# Patient Record
Sex: Female | Born: 1950 | Race: Black or African American | Hispanic: No | Marital: Married | Smoking: Never smoker
Health system: Southern US, Community
[De-identification: ages and names within clinical notes are randomized; demographics above are authoritative.]

## PROBLEM LIST (undated history)

## (undated) ENCOUNTER — Encounter

## (undated) ENCOUNTER — Ambulatory Visit: Payer: MEDICARE

## (undated) ENCOUNTER — Ambulatory Visit

## (undated) ENCOUNTER — Encounter: Attending: Gynecologic Oncology | Primary: Gynecologic Oncology

## (undated) ENCOUNTER — Ambulatory Visit
Payer: MEDICARE | Attending: Student in an Organized Health Care Education/Training Program | Primary: Student in an Organized Health Care Education/Training Program

## (undated) ENCOUNTER — Telehealth

## (undated) ENCOUNTER — Telehealth: Attending: Obstetrics & Gynecology | Primary: Obstetrics & Gynecology

## (undated) ENCOUNTER — Ambulatory Visit: Payer: PRIVATE HEALTH INSURANCE

## (undated) ENCOUNTER — Telehealth: Attending: Oncology | Primary: Oncology

## (undated) ENCOUNTER — Encounter: Attending: Oncology | Primary: Oncology

## (undated) ENCOUNTER — Encounter: Attending: Adult Health | Primary: Adult Health

## (undated) ENCOUNTER — Other Ambulatory Visit

## (undated) ENCOUNTER — Ambulatory Visit: Payer: MEDICARE | Attending: Clinical | Primary: Clinical

## (undated) ENCOUNTER — Inpatient Hospital Stay

## (undated) DIAGNOSIS — I10 Essential (primary) hypertension: Secondary | ICD-10-CM

## (undated) DIAGNOSIS — H409 Unspecified glaucoma: Secondary | ICD-10-CM

---

## 2005-02-14 ENCOUNTER — Emergency Department: Payer: Self-pay | Admitting: Emergency Medicine

## 2007-03-07 ENCOUNTER — Emergency Department: Payer: Self-pay | Admitting: Emergency Medicine

## 2011-03-21 ENCOUNTER — Emergency Department: Payer: Self-pay | Admitting: Unknown Physician Specialty

## 2012-07-23 ENCOUNTER — Emergency Department: Payer: Self-pay | Admitting: Emergency Medicine

## 2018-01-24 ENCOUNTER — Encounter: Payer: Self-pay | Admitting: Emergency Medicine

## 2018-01-24 ENCOUNTER — Other Ambulatory Visit: Payer: Self-pay

## 2018-01-24 ENCOUNTER — Emergency Department
Admission: EM | Admit: 2018-01-24 | Discharge: 2018-01-24 | Disposition: A | Discharge status: Home / Self Care | Payer: BC Managed Care – PPO | Attending: Emergency Medicine | Admitting: Emergency Medicine

## 2018-01-24 DIAGNOSIS — I10 Essential (primary) hypertension: Secondary | ICD-10-CM | POA: Diagnosis not present

## 2018-01-24 DIAGNOSIS — R42 Dizziness and giddiness: Secondary | ICD-10-CM

## 2018-01-24 HISTORY — DX: Unspecified glaucoma: H40.9

## 2018-01-24 LAB — CBC
HEMATOCRIT: 42 % (ref 35.0–47.0)
Hemoglobin: 13.9 g/dL (ref 12.0–16.0)
MCH: 30 pg (ref 26.0–34.0)
MCHC: 33.2 g/dL (ref 32.0–36.0)
MCV: 90.4 fL (ref 80.0–100.0)
PLATELETS: 286 10*3/uL (ref 150–440)
RBC: 4.65 MIL/uL (ref 3.80–5.20)
RDW: 14.7 % — AB (ref 11.5–14.5)
WBC: 7.6 10*3/uL (ref 3.6–11.0)

## 2018-01-24 LAB — BASIC METABOLIC PANEL
Anion gap: 10 (ref 5–15)
BUN: 17 mg/dL (ref 6–20)
CHLORIDE: 107 mmol/L (ref 101–111)
CO2: 22 mmol/L (ref 22–32)
CREATININE: 1.01 mg/dL — AB (ref 0.44–1.00)
Calcium: 9.4 mg/dL (ref 8.9–10.3)
GFR calc Af Amer: >60 mL/min (ref >60)
GFR calc non Af Amer: 58 mL/min — ABNORMAL LOW (ref >60)
GLUCOSE: 106 mg/dL — AB (ref 65–99)
Potassium: 3.7 mmol/L (ref 3.5–5.1)
SODIUM: 139 mmol/L (ref 135–145)

## 2018-01-24 MED ORDER — MECLIZINE HCL 25 MG PO TABS
25.0000 mg | ORAL_TABLET | Freq: Once | ORAL | Status: AC
  Administered 2018-01-24: 25 mg via ORAL
  Filled 2018-01-24: qty 1

## 2018-01-24 MED ORDER — MECLIZINE HCL 25 MG PO TABS: 25.0000 mg | ORAL_TABLET | Freq: Three times a day (TID) | ORAL | 0 refills | Status: DC | PRN

## 2018-01-24 NOTE — ED Triage Notes (Signed)
EMS reports patient showed PVC on 12 lead, vitals 180/100 BP.

## 2018-01-24 NOTE — ED Triage Notes (Signed)
Pt to ED via EMS from work c/o hypertension and dizziness.  States dizzy when standing and looking down, off balance feeling.  Denies pain or SOB.  Denies similar episodes.  Denies cardiac hx, only medication for glaucoma.  Skin warm and dry, chest rise even and unlabored.

## 2018-01-24 NOTE — ED Provider Notes (Signed)
[facility] Emergency Department Provider Note  ____________________________________________   I have reviewed the triage vital signs and the nursing notes.   HISTORY  Chief Complaint Hypertension   History limited by: Not Limited   HPI Brandy Ewing is a 67 y.o. female who presents to the emergency department today because of concerns for episode of dizziness.  The patient states that it started today.  She was at school when it started.  She has noticed that it is worse when she stands up or moves.  Does get the sense that things are spinning around her.  Patient had a similar episode on Sunday while she was at church when she became dizzy.  She denies any trauma to her head.  Recently was treated for urinary tract infection but finished antibiotics a couple of days ago.  Denies any chest pain or palpitations.   Per medical record review patient has a history of glaucoma.  Past Medical History:  Diagnosis Date  . Glaucoma     There are no active problems to display for this patient.   Past Surgical History:  Procedure Laterality Date  . CESAREAN SECTION      Prior to Admission medications   Not on File    Allergies Patient has no known allergies.  History reviewed. No pertinent family history.  Social History Social History   Tobacco Use  . Smoking status: Never Smoker  . Smokeless tobacco: Never Used  Substance Use Topics  . Alcohol use: No    Frequency: Never  . Drug use: No    Review of Systems Constitutional: No fever/chills Eyes: No visual changes. ENT: No sore throat. Cardiovascular: Denies chest pain. Respiratory: Denies shortness of breath. Gastrointestinal: No abdominal pain.  No nausea, no vomiting.  No diarrhea.   Genitourinary: Negative for dysuria. Musculoskeletal: Negative for back pain. Skin: Negative for rash. Neurological: Positive for dizziness. ____________________________________________   PHYSICAL  EXAM:  VITAL SIGNS: ED Triage Vitals  Enc Vitals Group     BP 01/24/18 1452 (!) 191/84     Pulse Rate 01/24/18 1452 75     Resp 01/24/18 1452 16     Temp 01/24/18 1452 (!) 97.5 F (36.4 C)     Temp Source 01/24/18 1452 Oral     SpO2 01/24/18 1452 100 %     Weight 01/24/18 1450 165 lb (74.8 kg)   Constitutional: Alert and oriented. Well appearing and in no distress. Eyes: Conjunctivae are normal.  ENT   Head: Normocephalic and atraumatic.   Nose: No congestion/rhinnorhea.   Mouth/Throat: Mucous membranes are moist.   Neck: No stridor. Hematological/Lymphatic/Immunilogical: No cervical lymphadenopathy. Cardiovascular: Normal rate, regular rhythm.  No murmurs, rubs, or gallops.  Respiratory: Normal respiratory effort without tachypnea nor retractions. Breath sounds are clear and equal bilaterally. No wheezes/rales/rhonchi. Gastrointestinal: Soft and non tender. No rebound. No guarding.  Genitourinary: Deferred Musculoskeletal: Normal range of motion in all extremities. No lower extremity edema. Neurologic:  Normal speech and language. No gross focal neurologic deficits are appreciated.  Skin:  Skin is warm, dry and intact. No rash noted. Psychiatric: Mood and affect are normal. Speech and behavior are normal. Patient exhibits appropriate insight and judgment.  ____________________________________________    LABS (pertinent positives/negatives)  CBC wnl except rdw 14.7 BMP glu 106, cr 1.01 ____________________________________________   EKG  I, Nance Pear, attending physician, personally viewed and interpreted this EKG  EKG Time: 1554 Rate: 58 Rhythm: sinus bradycardia Axis: right axis deviation Intervals: qtc  445 QRS: narrow ST changes: no st elevation Impression: abnormal ekg   ____________________________________________     RADIOLOGY  None  ____________________________________________   PROCEDURES  Procedures  ____________________________________________   INITIAL IMPRESSION / ASSESSMENT AND PLAN / ED COURSE  Pertinent labs & imaging results that were available during my care of the patient were reviewed by me and considered in my medical decision making (see chart for details).  Patient presented to the emergency department today because of concerns for dizziness.  Clinical history is concerning for possible vertigo.  It is worse with position change and then resolves.  Patient was given meclizine here in the emergency department and did feel better.  No concerning anemia or electrolyte abnormality.  Terms of the high blood pressure this could be due to the fact the patient is not feeling well.  Will have patient follow-up with primary care. Discussed findings and vertigo with patient.    ____________________________________________   FINAL CLINICAL IMPRESSION(S) / ED DIAGNOSES  Final diagnoses:  Dizziness  Hypertension, unspecified type     Note: This dictation was prepared with Dragon dictation. Any transcriptional errors that result from this process are unintentional     Nance Pear, MD 01/24/18 1729

## 2018-01-24 NOTE — Discharge Instructions (Signed)
Please seek medical attention for any high fevers, chest pain, shortness of breath, change in behavior, persistent vomiting, bloody stool or any other new or concerning symptoms.  

## 2018-02-17 ENCOUNTER — Ambulatory Visit (INDEPENDENT_AMBULATORY_CARE_PROVIDER_SITE_OTHER): Payer: BC Managed Care – PPO | Admitting: Vascular Surgery

## 2018-02-17 ENCOUNTER — Other Ambulatory Visit (INDEPENDENT_AMBULATORY_CARE_PROVIDER_SITE_OTHER): Payer: Self-pay | Admitting: Vascular Surgery

## 2018-02-17 ENCOUNTER — Encounter (INDEPENDENT_AMBULATORY_CARE_PROVIDER_SITE_OTHER): Payer: Self-pay | Admitting: Vascular Surgery

## 2018-02-17 ENCOUNTER — Ambulatory Visit (INDEPENDENT_AMBULATORY_CARE_PROVIDER_SITE_OTHER): Payer: BC Managed Care – PPO

## 2018-02-17 VITALS — BP 144/78 | HR 65 | Resp 16 | Ht 60.0 in | Wt 154.0 lb

## 2018-02-17 DIAGNOSIS — I10 Essential (primary) hypertension: Secondary | ICD-10-CM

## 2018-02-17 DIAGNOSIS — I150 Renovascular hypertension: Secondary | ICD-10-CM | POA: Diagnosis not present

## 2018-02-17 NOTE — Patient Instructions (Signed)
Renovascular Hypertension Renovascular hypertension is high blood pressure that happens when the arteries that carry blood to the kidneys (renal arteries) become narrow. Blood pressure is a measurement of how strongly your blood is pressing against the walls of your arteries. Hypertension, or high blood pressure, is a long-term condition in which blood pressure is higher than normal. Untreated hypertension, including renovascular hypertension, can lead to various health problems, such as:  Heart failure.  Heart disease.  Stroke.  Kidney failure.  Blood vessel damage.  Blindness or vision problems.  What are the causes? This condition occurs when one or both of the renal arteries become narrow. This narrowing reduces blood flow to the kidneys, causing the kidneys to sense that blood pressure is low. As a result, the kidneys make an enzyme called renin that causes an increase in blood pressure. Many conditions can cause the renal arteries to become narrow and lead to renovascular hypertension. Some of these are:  Atherosclerosis. This is a hardening of the renal arteries. It causes plaque to build up and block the renal arteries.  Fibromuscular dysplasia. This is a condition in which cells of the artery wall overgrow, causing a narrowing of the renal arteries. It is a common cause of renovascular hypertension in younger women.  A blockage in the renal artery due to injury, tumors, or blood clots (rare).  What increases the risk? You are more likely to develop this condition if:  You are a woman who is younger than age 71.  You are a man who is older than age 42.  You have a history of heart problems or strokes.  What are the signs or symptoms? In many cases, there are no symptoms. If symptoms are present, they may include:  Sudden high blood pressure that gets worse in older people who previously had well-controlled blood pressure.  Nausea and vomiting.  Vision  problems.  Chest pain.  How is this diagnosed? This condition may be diagnosed based on:  A physical exam and blood pressure check. During the exam, your health care provider may use a stethoscope to listen for a "whooshing" noise (bruit) over the abdomen or on either side between the ribs and the hip (flank area).  Blood tests to measure renin and to check your hormone levels, including a hormone called aldosterone. Aldosterone controls the salt and water balance in your body.  Imaging tests. These may include: ? An ultrasound. This test uses sound waves to produce an image of the inside of your body. ? Renal angiogram. For this test, a dye is injected into a kidney artery to show narrowing of the artery on an X-ray. ? MRI of the arteries that supply the kidneys.  How is this treated? This condition may be treated with:  Medicines. These may be given to help you control your blood pressure.  Treatments or lifestyle changes to address factors or conditions that may be contributing to high blood pressure. This may mean lowering your cholesterol, eating a heart-healthy diet, exercising, and maintaining an ideal body weight.  Surgery to remove a blockage. This may be necessary if a renal artery is blocked badly.  Percutaneous transluminal renal angioplasty (PTRA). This is a procedure to open narrow renal arteries if they are not completely blocked. Sometimes a stent is placed in the artery to prevent the artery from becoming blocked again.  Follow these instructions at home: Monitoring your blood pressure  Monitor your blood pressure at home as told by your health care provider. ?  A blood pressure reading is recorded as two numbers, such as 120 over 80 (or 120/80). The first ("top") number is called the systolic pressure. It is a measure of the pressure in your arteries when your heart beats. The second ("bottom") number is called the diastolic pressure. It is a measure of the pressure in  your arteries as your heart relaxes between beats. A normal blood pressure reading is:  Systolic: below 268.  Diastolic: below 80. ? Your personal target blood pressure may vary depending on your medical conditions, your age, and other factors.  Have your blood pressure rechecked as told by your health care provider. Lifestyle  Work with your health care provider to maintain a healthy body weight or to lose weight. Ask what an ideal weight is for you.  Exercise regularly. Get at least 30-45 minutes of aerobic exercise, at least 5 times a week.  Do not use any products that contain nicotine or tobacco, such as cigarettes and e-cigarettes. If you need help quitting, ask your health care provider. Eating and drinking  Eat a heart-healthy diet. This may include: ? Following the DASH diet. This diet is high in fruits, vegetables, and whole grains. It is low in salt (sodium), saturated fat, and added sugars. ? Keeping your sodium intake below 1,500 mg per day. Do not add salt to your food. Check food labels to see how much sodium is in a food or beverage.  Limit alcohol intake to no more than 1 drink a day for nonpregnant women and 2 drinks a day for men. One drink equals 12 oz of beer, 5 oz of wine, or 1 oz of hard liquor.  Try to limit caffeine. Caffeine may make your renovascular hypertension worse. Check ingredients and nutrition facts to see if a food or beverage contains caffeine. General instructions  Take over-the-counter and prescription medicines only as told by your health care provider.  Keep all follow-up visits as told by your health care provider. This is important. Contact a health care provider if:  Your symptoms continue to get worse and medicine does not help.  You have a fever. Get help right away if:  You develop shortness of breath.  You develop numbness on one side.  You develop areas of muscle weakness.  You are unable to speak.  You feel light-headed or  you pass out.  You have sudden spikes of high blood pressure.  You have symptoms of very high blood pressure. Summary  Renovascular hypertension is high blood pressure that happens when the arteries that carry blood to the kidneys (renal arteries) become narrow.  In many cases, there are no symptoms for this condition.  There are several treatments for renovascular hypertension, including medicines, lifestyle changes, surgery to remove a blockage, or a procedure to widen a narrow artery.  Monitor your blood pressure at home as told by your health care provider. This information is not intended to replace advice given to you by your health care provider. Make sure you discuss any questions you have with your health care provider. Document Released: 10/22/2005 Document Revised: 10/13/2016 Document Reviewed: 10/13/2016 Elsevier Interactive Patient Education  2017 [employer].

## 2018-02-17 NOTE — Assessment & Plan Note (Signed)
The patient has recently developed hypertension so a renal artery duplex was done today. This demonstrates normal renal artery velocities and indices bilaterally with normal kidney sizes bilaterally consistent with no hemodynamically significant renal artery stenosis. It appears as if she has essential hypertension most likely.  Her blood pressure has improved with the addition of amlodipine.  No intervention or further evaluation from a vascular standpoint is required.  I will see the patient back as needed.

## 2018-02-17 NOTE — Progress Notes (Signed)
Patient ID: Brandy Ewing, female   DOB: 10-Sep-1951, 66 y.o.   MRN: [id number]  Chief Complaint  Patient presents with  . New Patient (Initial Visit)    ref Iona Beard for Renal    HPI Brandy Ewing is a 67 y.o. female.  I am asked to see the patient by Dr. Iona Beard for evaluation of her renal arteries.  The patient reports recently beginning to have issues with high blood pressure.  Prior to the past few months, she never had any issues with hypertension.  Her blood pressures ran up in the 160s and 170s.  She denied any trauma or injury.  No major recent lifestyle changes.  With the abrupt onset of hypertension, she was started on Norvasc which has improved her blood pressure significantly.  She is mildly elevated today, but in general her blood pressures have been running pretty normal.  To assess her for potential secondary hypertension, renal artery duplex is performed today.  This demonstrates normal renal artery velocities and indices bilaterally with normal kidney sizes bilaterally consistent with no hemodynamically significant renal artery stenosis.   Past Medical History:  Diagnosis Date  . Glaucoma     Past Surgical History:  Procedure Laterality Date  . CESAREAN SECTION      Family History No bleeding disorders, clotting disorders, autoimmune diseases, or aneurysms  Social History Social History   Tobacco Use  . Smoking status: Never Smoker  . Smokeless tobacco: Never Used  Substance Use Topics  . Alcohol use: No    Frequency: Never  . Drug use: No     No Known Allergies  Current Outpatient Medications  Medication Sig Dispense Refill  . amLODipine (NORVASC) 5 MG tablet   11  . aspirin EC 81 MG tablet Take by mouth.    . dorzolamide (TRUSOPT) 2 % ophthalmic solution Place 1 drop into both eyes 2 (two) times daily.  2  . LUMIGAN 0.01 % SOLN Place 1 drop into both eyes at bedtime.  0  . meclizine (ANTIVERT) 25 MG tablet Take 1 tablet (25 mg total) by mouth 3  (three) times daily as needed for dizziness. (Patient not taking: Reported on 02/17/2018) 30 tablet 0   No current facility-administered medications for this visit.       REVIEW OF SYSTEMS (Negative unless checked)  Constitutional: _0 Weight loss  _1 Fever  _2 Chills Cardiac: _3 Chest pain   _4 Chest pressure   _5 Palpitations   _6 Shortness of breath when laying flat   _7 Shortness of breath at rest   _8 Shortness of breath with exertion. Vascular:  _9 Pain in legs with walking   _10 Pain in legs at rest   _11 Pain in legs when laying flat   _12 Claudication   _13 Pain in feet when walking  _14 Pain in feet at rest  _15 Pain in feet when laying flat   _16 History of DVT   _17 Phlebitis   _18 Swelling in legs   _19 Varicose veins   _20 Non-healing ulcers Pulmonary:   _21 Uses home oxygen   _22 Productive cough   _23 Hemoptysis   _24 Wheeze  _25 COPD   _26 Asthma Neurologic:  _27 Dizziness  _28 Blackouts   _29 Seizures   _30 History of stroke   _31 History of TIA  _32 Aphasia   _33 Temporary blindness   _34 Dysphagia   _35 Weakness or numbness in arms   _36 Weakness or numbness in legs Musculoskeletal:  _37 Arthritis   _38 Joint swelling   _39 Joint pain   _40 Low back pain Hematologic:  _41 Easy bruising  _42 Easy bleeding   _43 Hypercoagulable state   _44 Anemic  _45   Hepatitis Gastrointestinal:  _0 Blood in stool   _1 Vomiting blood  _2 Gastroesophageal reflux/heartburn   _3 Abdominal pain Genitourinary:  _4 Chronic kidney disease   _5 Difficult urination  _6 Frequent urination  _7 Burning with urination   _8 Hematuria Skin:  _9 Rashes   _10 Ulcers   _11 Wounds Psychological:  _12 History of anxiety   _13  History of major depression.    Physical Exam BP (!) 144/78 (BP Location: Right Arm)   Pulse 65   Resp 16   Ht 5' (1.524 m)   Wt 69.9 kg (154 lb)   BMI 30.08 kg/m  Gen:  WD/WN, NAD Head: [place]/AT, No temporalis wasting Ear/Nose/Throat: Hearing grossly intact, nares w/o erythema or drainage, oropharynx w/o Erythema/Exudate Eyes: Conjunctiva clear, sclera non-icteric  Neck: trachea  midline.  No JVD.  Pulmonary:  Good air movement, respirations not labored, no use of accessory muscles Cardiac: RRR Vascular:  Vessel Right Left  Radial Palpable Palpable                                   Musculoskeletal: M/S 5/5 throughout.  Extremities without ischemic changes.  No deformity or atrophy.  No edema. Neurologic: Sensation grossly intact in extremities.  Symmetrical.  Speech is fluent. Motor exam as listed above. Psychiatric: Judgment intact, Mood & affect appropriate for pt's clinical situation. Dermatologic: No rashes or ulcers noted.  No cellulitis or open wounds.  Radiology No results found.  Labs Recent Results (from the past 2160 hour(s))  Basic metabolic panel     Status: Abnormal   Collection Time: 01/24/18  2:52 PM  Result Value Ref Range   Sodium 139 135 - 145 mmol/L   Potassium 3.7 3.5 - 5.1 mmol/L   Chloride 107 101 - 111 mmol/L   CO2 22 22 - 32 mmol/L   Glucose, Bld 106 (H) 65 - 99 mg/dL   BUN 17 6 - 20 mg/dL   Creatinine, Ser 1.01 (H) 0.44 - 1.00 mg/dL   Calcium 9.4 8.9 - 10.3 mg/dL   GFR calc non Af Amer 58 (L) >60 mL/min   GFR calc Af Amer >60 >60 mL/min    Comment: (NOTE) The eGFR has been calculated using the CKD EPI equation. This calculation has not been validated in all clinical situations. eGFR's persistently <60 mL/min signify possible Chronic Kidney Disease.    Anion gap 10 5 - 15    Comment: Performed at [facility], [address] [place], [place] [address]  CBC     Status: Abnormal   Collection Time: 01/24/18  2:52 PM  Result Value Ref Range   WBC 7.6 3.6 - 11.0 K/uL   RBC 4.65 3.80 - 5.20 MIL/uL   Hemoglobin 13.9 12.0 - 16.0 g/dL   HCT 42.0 35.0 - 47.0 %   MCV 90.4 80.0 - 100.0 fL   MCH 30.0 26.0 - 34.0 pg   MCHC 33.2 32.0 - 36.0 g/dL   RDW 14.7 (H) 11.5 - 14.5 %   Platelets 286 150 - 440 K/uL    Comment: Performed at [facility], [address] [place], [place] [address]     Assessment/Plan:  Essential hypertension The patient has recently developed hypertension so a renal artery duplex was done today. This demonstrates normal renal artery velocities and indices bilaterally with normal kidney sizes bilaterally consistent with no hemodynamically significant renal artery stenosis. It appears as if she has essential hypertension most likely.  Her blood pressure has improved  with the addition of amlodipine.  No intervention or further evaluation from a vascular standpoint is required.  I will see the patient back as needed.      Leotis Pain 02/17/2018, 9:52 AM   This note was created with Dragon medical transcription system.  Any errors from dictation are unintentional.

## 2019-11-04 ENCOUNTER — Emergency Department: Payer: BC Managed Care – PPO

## 2019-11-04 ENCOUNTER — Other Ambulatory Visit: Payer: Self-pay

## 2019-11-04 ENCOUNTER — Inpatient Hospital Stay
Admission: EM | Admit: 2019-11-04 | Discharge: 2019-11-09 | DRG: 871 | Disposition: A | Discharge status: Home / Self Care | Payer: BC Managed Care – PPO | Attending: Internal Medicine | Admitting: Internal Medicine

## 2019-11-04 DIAGNOSIS — A419 Sepsis, unspecified organism: Secondary | ICD-10-CM

## 2019-11-04 DIAGNOSIS — A4189 Other specified sepsis: Secondary | ICD-10-CM | POA: Diagnosis not present

## 2019-11-04 DIAGNOSIS — U071 COVID-19: Secondary | ICD-10-CM | POA: Diagnosis present

## 2019-11-04 DIAGNOSIS — J1289 Other viral pneumonia: Secondary | ICD-10-CM | POA: Diagnosis present

## 2019-11-04 DIAGNOSIS — Z7982 Long term (current) use of aspirin: Secondary | ICD-10-CM

## 2019-11-04 DIAGNOSIS — R0902 Hypoxemia: Secondary | ICD-10-CM | POA: Diagnosis present

## 2019-11-04 DIAGNOSIS — E876 Hypokalemia: Secondary | ICD-10-CM | POA: Diagnosis present

## 2019-11-04 DIAGNOSIS — Z825 Family history of asthma and other chronic lower respiratory diseases: Secondary | ICD-10-CM

## 2019-11-04 DIAGNOSIS — N390 Urinary tract infection, site not specified: Secondary | ICD-10-CM | POA: Diagnosis present

## 2019-11-04 DIAGNOSIS — I10 Essential (primary) hypertension: Secondary | ICD-10-CM | POA: Diagnosis present

## 2019-11-04 DIAGNOSIS — Z20822 Contact with and (suspected) exposure to covid-19: Secondary | ICD-10-CM

## 2019-11-04 DIAGNOSIS — J189 Pneumonia, unspecified organism: Secondary | ICD-10-CM | POA: Diagnosis not present

## 2019-11-04 DIAGNOSIS — Z79899 Other long term (current) drug therapy: Secondary | ICD-10-CM

## 2019-11-04 DIAGNOSIS — H409 Unspecified glaucoma: Secondary | ICD-10-CM | POA: Diagnosis present

## 2019-11-04 DIAGNOSIS — E559 Vitamin D deficiency, unspecified: Secondary | ICD-10-CM | POA: Diagnosis present

## 2019-11-04 LAB — BASIC METABOLIC PANEL
Anion gap: 14 (ref 5–15)
BUN: 16 mg/dL (ref 8–23)
CO2: 22 mmol/L (ref 22–32)
Calcium: 9 mg/dL (ref 8.9–10.3)
Chloride: 105 mmol/L (ref 98–111)
Creatinine, Ser: 1.05 mg/dL — ABNORMAL HIGH (ref 0.44–1.00)
GFR calc Af Amer: >60 mL/min (ref >60)
GFR calc non Af Amer: 56 mL/min — ABNORMAL LOW (ref >60)
Glucose, Bld: 113 mg/dL — ABNORMAL HIGH (ref 70–99)
Potassium: 3.2 mmol/L — ABNORMAL LOW (ref 3.5–5.1)
Sodium: 141 mmol/L (ref 135–145)

## 2019-11-04 LAB — TROPONIN I (HIGH SENSITIVITY): Troponin I (High Sensitivity): 14 ng/L (ref <18)

## 2019-11-04 LAB — CBC
HCT: 38.9 % (ref 36.0–46.0)
Hemoglobin: 12.9 g/dL (ref 12.0–15.0)
MCH: 29.5 pg (ref 26.0–34.0)
MCHC: 33.2 g/dL (ref 30.0–36.0)
MCV: 89 fL (ref 80.0–100.0)
Platelets: 225 10*3/uL (ref 150–400)
RBC: 4.37 MIL/uL (ref 3.87–5.11)
RDW: 13.7 % (ref 11.5–15.5)
WBC: 13.4 10*3/uL — ABNORMAL HIGH (ref 4.0–10.5)
nRBC: 0 % (ref 0.0–0.2)

## 2019-11-04 MED ORDER — SODIUM CHLORIDE 0.9 % IV SOLN
500.0000 mg | INTRAVENOUS | Status: AC
  Administered 2019-11-05: 500 mg via INTRAVENOUS
  Filled 2019-11-04: qty 500

## 2019-11-04 MED ORDER — ACETAMINOPHEN 500 MG PO TABS
1000.0000 mg | ORAL_TABLET | Freq: Once | ORAL | Status: AC
  Administered 2019-11-04: 1000 mg via ORAL
  Filled 2019-11-04: qty 2

## 2019-11-04 MED ORDER — SODIUM CHLORIDE 0.9 % IV SOLN
1.0000 g | INTRAVENOUS | Status: AC
  Administered 2019-11-05: 1 g via INTRAVENOUS
  Filled 2019-11-04: qty 10

## 2019-11-04 MED ORDER — SODIUM CHLORIDE 0.9% FLUSH: 3.0000 mL | Freq: Once | INTRAVENOUS | Status: DC

## 2019-11-04 MED ORDER — LACTATED RINGERS IV BOLUS
1000.0000 mL | Freq: Once | INTRAVENOUS | Status: AC
Start: 2019-11-04 — End: 2019-11-05
  Administered 2019-11-04: 1000 mL via INTRAVENOUS

## 2019-11-04 NOTE — ED Provider Notes (Signed)
[facility] Emergency Department Provider Note   ____________________________________________   First MD Initiated Contact with Patient 11/04/19 2048     (approximate)  I have reviewed the triage vital signs and the nursing notes.   HISTORY  Chief Complaint Dizziness    HPI Brandy Ewing is a 68 y.o. female with past medical history of hypertension who presents to the ED complaining of shortness of breath and malaise.  Patient reports she first developed a cough 4 to 5 days ago which is productive of yellowish sputum.  This is been associated with worsening shortness of breath, but she denies any pain in her chest.  She is not aware of any fevers, but has not taken her temperature at home.  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  EMS had stated that patient's O2 sats on room air were about 90%, subsequently placed on 2 liters.        Past Medical History:  Diagnosis Date  . Glaucoma     Patient Active Problem List   Diagnosis Date Noted  . Hypoxemia 11/04/2019  . CAP (community acquired pneumonia) 11/04/2019  . Suspected COVID-19 virus infection 11/04/2019  . Glaucoma 11/04/2019  . Essential hypertension 02/17/2018    Past Surgical History:  Procedure Laterality Date  . CESAREAN SECTION      Prior to Admission medications   Medication Sig Start Date End Date Taking? Authorizing Provider  amLODipine (NORVASC) 5 MG tablet  02/01/18   [provider]  aspirin EC 81 MG tablet Take by mouth. 02/02/18   [provider]  dorzolamide (TRUSOPT) 2 % ophthalmic solution Place 1 drop into both eyes 2 (two) times daily. 10/27/17   [provider]  LUMIGAN 0.01 % SOLN Place 1 drop into both eyes at bedtime. 11/29/17   [provider]  meclizine (ANTIVERT) 25 MG tablet Take 1 tablet (25 mg total) by mouth 3 (three) times daily as needed for dizziness. Patient not taking: Reported on  02/17/2018 01/24/18   Nance Pear, MD    Allergies Patient has no known allergies.  Family History  Problem Relation Age of Onset  . Asthma Mother   . Asthma Brother     Social History Social History   Tobacco Use  . Smoking status: Never Smoker  . Smokeless tobacco: Never Used  Substance Use Topics  . Alcohol use: No  . Drug use: No    Review of Systems  Constitutional: No fever/chills Eyes: No visual changes. ENT: No sore throat. Cardiovascular: Denies chest pain. Respiratory: Positive for cough and shortness of breath. Gastrointestinal: No abdominal pain.  No nausea, no vomiting.  No diarrhea.  No constipation. Genitourinary: Negative for dysuria. Musculoskeletal: Negative for back pain. Skin: Negative for rash. Neurological: Negative for headaches, focal weakness or numbness.  Positive for dizziness.  ____________________________________________   PHYSICAL EXAM:  VITAL SIGNS: ED Triage Vitals  Enc Vitals Group     BP --      Pulse Rate 11/04/19 2052 (!) 103     Resp 11/04/19 2052 19     Temp 11/04/19 2052 (!) 101.7 F (38.7 C)     Temp Source 11/04/19 2052 Oral     SpO2 11/04/19 2052 94 %     Weight 11/04/19 2055 152 lb (68.9 kg)     Height 11/04/19 2055 5' (1.524 m)     Head Circumference --      Peak Flow --  Pain Score 11/04/19 2055 0     Pain Loc --      Pain Edu? --      Excl. in [place]? --     Constitutional: Alert and oriented. Eyes: Conjunctivae are normal. Head: Atraumatic. Nose: No congestion/rhinnorhea. Mouth/Throat: Mucous membranes are moist. Neck: Normal ROM Cardiovascular: Tachycardic, regular rhythm. Grossly normal heart sounds. Respiratory: Normal respiratory effort.  No retractions. Lungs CTAB. Gastrointestinal: Soft and nontender. No distention. Genitourinary: deferred Musculoskeletal: No lower extremity tenderness nor edema. Neurologic:  Normal speech and language. No gross focal neurologic deficits are  appreciated. Skin:  Skin is warm, dry and intact. No rash noted. Psychiatric: Mood and affect are normal. Speech and behavior are normal.  ____________________________________________   LABS (all labs ordered are listed, but only abnormal results are displayed)  Labs Reviewed  BASIC METABOLIC PANEL - Abnormal; Notable for the following components:      Result Value   Potassium 3.2 (*)    Glucose, Bld 113 (*)    Creatinine, Ser 1.05 (*)    GFR calc non Af Amer 56 (*)    All other components within normal limits  CBC - Abnormal; Notable for the following components:   WBC 13.4 (*)    All other components within normal limits  CULTURE, BLOOD (ROUTINE X 2)  CULTURE, BLOOD (ROUTINE X 2)  SARS CORONAVIRUS 2 (TAT 6-24 HRS)  URINALYSIS, COMPLETE (UACMP) WITH MICROSCOPIC  LACTIC ACID, PLASMA  LACTIC ACID, PLASMA  POC SARS CORONAVIRUS 2 AG -  ED  TROPONIN I (HIGH SENSITIVITY)  TROPONIN I (HIGH SENSITIVITY)   ____________________________________________  EKG  ED ECG REPORT I, Blake Divine, the attending physician, personally viewed and interpreted this ECG.   Date: 11/04/2019  EKG Time: 20:57  Rate: 95  Rhythm: normal sinus rhythm  Axis: Normal  Intervals:none  ST&T Change: None   PROCEDURES  Procedure(s) performed (including Critical Care):  .Critical Care Performed by: Blake Divine, MD Authorized by: Blake Divine, MD   Critical care provider statement:    Critical care time (minutes):  45   Critical care time was exclusive of:  Separately billable procedures and treating other patients and teaching time   Critical care was necessary to treat or prevent imminent or life-threatening deterioration of the following conditions:  Sepsis   Critical care was time spent personally by me on the following activities:  Discussions with consultants, evaluation of patient's response to treatment, examination of patient, ordering and performing treatments and interventions,  ordering and review of laboratory studies, ordering and review of radiographic studies, pulse oximetry, re-evaluation of patient's condition, obtaining history from patient or surrogate and review of old charts   I assumed direction of critical care for this patient from another provider in my specialty: no       ____________________________________________   INITIAL IMPRESSION / [place] / ED COURSE       67 year old female presents to the ED with 4 to 5 days of worsening cough and shortness of breath with positive COVID-19 exposure.  Initial vital signs here are concerning for sepsis and chest x-ray with evidence of multifocal pneumonia.  We will treat with Rocephin and azithromycin, initial antigen testing for COVID-19 is negative, will send PCR.  Lab work thus far is reassuring and heart rate is improving following IV fluid bolus.  EKG without acute ischemic changes and troponin is negative, doubt ACS.  We were able to wean off patient's 2 L nasal cannula and her she is  maintaining O2 sats on room air.  Will admit for further evaluation given concern for sepsis.  Case discussed with hospitalist, who excepts patient for admission.      ____________________________________________   FINAL CLINICAL IMPRESSION(S) / ED DIAGNOSES  Final diagnoses:  Sepsis without acute organ dysfunction, due to unspecified organism [facility])  Multifocal pneumonia  Suspected COVID-19 virus infection     ED Discharge Orders    None       Note:  This document was prepared using Dragon voice recognition software and may include unintentional dictation errors.   Blake Divine, MD 11/04/19 2337

## 2019-11-04 NOTE — ED Triage Notes (Signed)
Pt thinks she was exposed to covid, has had recent test, no results yet. Pt with dizziness and cough. O2 sats per EMS 90-91%, EMS put her on 2L [place], sats up to 98%.Marland Kitchen

## 2019-11-05 DIAGNOSIS — Z825 Family history of asthma and other chronic lower respiratory diseases: Secondary | ICD-10-CM | POA: Diagnosis not present

## 2019-11-05 DIAGNOSIS — A4189 Other specified sepsis: Secondary | ICD-10-CM | POA: Diagnosis present

## 2019-11-05 DIAGNOSIS — J189 Pneumonia, unspecified organism: Secondary | ICD-10-CM | POA: Diagnosis present

## 2019-11-05 DIAGNOSIS — R0902 Hypoxemia: Secondary | ICD-10-CM | POA: Diagnosis present

## 2019-11-05 DIAGNOSIS — Z79899 Other long term (current) drug therapy: Secondary | ICD-10-CM | POA: Diagnosis not present

## 2019-11-05 DIAGNOSIS — I10 Essential (primary) hypertension: Secondary | ICD-10-CM | POA: Diagnosis present

## 2019-11-05 DIAGNOSIS — J1289 Other viral pneumonia: Secondary | ICD-10-CM | POA: Diagnosis present

## 2019-11-05 DIAGNOSIS — E876 Hypokalemia: Secondary | ICD-10-CM | POA: Diagnosis present

## 2019-11-05 DIAGNOSIS — H409 Unspecified glaucoma: Secondary | ICD-10-CM | POA: Diagnosis present

## 2019-11-05 DIAGNOSIS — Z20828 Contact with and (suspected) exposure to other viral communicable diseases: Secondary | ICD-10-CM | POA: Diagnosis not present

## 2019-11-05 DIAGNOSIS — U071 COVID-19: Secondary | ICD-10-CM | POA: Diagnosis present

## 2019-11-05 DIAGNOSIS — E559 Vitamin D deficiency, unspecified: Secondary | ICD-10-CM | POA: Diagnosis present

## 2019-11-05 DIAGNOSIS — Z7982 Long term (current) use of aspirin: Secondary | ICD-10-CM | POA: Diagnosis not present

## 2019-11-05 DIAGNOSIS — N390 Urinary tract infection, site not specified: Secondary | ICD-10-CM | POA: Diagnosis present

## 2019-11-05 LAB — URINALYSIS, COMPLETE (UACMP) WITH MICROSCOPIC
Bilirubin Urine: NEGATIVE
Glucose, UA: NEGATIVE mg/dL
Ketones, ur: 20 mg/dL — AB
Nitrite: NEGATIVE
Protein, ur: 100 mg/dL — AB
Specific Gravity, Urine: 1.021 (ref 1.005–1.030)
pH: 6 (ref 5.0–8.0)

## 2019-11-05 LAB — RESPIRATORY PANEL BY PCR

## 2019-11-05 LAB — SARS CORONAVIRUS 2 (TAT 6-24 HRS): SARS Coronavirus 2: POSITIVE — AB

## 2019-11-05 LAB — FIBRIN DERIVATIVES D-DIMER (ARMC ONLY): Fibrin derivatives D-dimer (ARMC): 4217.97 ng/mL (FEU) — ABNORMAL HIGH (ref 0.00–499.00)

## 2019-11-05 LAB — TROPONIN I (HIGH SENSITIVITY): Troponin I (High Sensitivity): 19 ng/L — ABNORMAL HIGH (ref <18)

## 2019-11-05 LAB — PROCALCITONIN: Procalcitonin: 0.19 ng/mL

## 2019-11-05 LAB — MAGNESIUM: Magnesium: 2.2 mg/dL (ref 1.7–2.4)

## 2019-11-05 LAB — HIV ANTIBODY (ROUTINE TESTING W REFLEX): HIV Screen 4th Generation wRfx: NONREACTIVE

## 2019-11-05 LAB — FERRITIN: Ferritin: 416 ng/mL — ABNORMAL HIGH (ref 11–307)

## 2019-11-05 LAB — LACTIC ACID, PLASMA: Lactic Acid, Venous: 0.9 mmol/L (ref 0.5–1.9)

## 2019-11-05 LAB — FIBRINOGEN: Fibrinogen: >750 mg/dL — ABNORMAL HIGH (ref 210–475)

## 2019-11-05 MED ORDER — ASPIRIN EC 81 MG PO TBEC
81.0000 mg | DELAYED_RELEASE_TABLET | Freq: Every day | ORAL | Status: DC
  Administered 2019-11-06 – 2019-11-09 (×4): 81 mg via ORAL
  Filled 2019-11-05 (×4): qty 1

## 2019-11-05 MED ORDER — TIMOLOL MALEATE 0.5 % OP SOLN
1.0000 [drp] | Freq: Every day | OPHTHALMIC | Status: DC
  Administered 2019-11-06 – 2019-11-09 (×4): 1 [drp] via OPHTHALMIC
  Filled 2019-11-05: qty 5

## 2019-11-05 MED ORDER — SODIUM CHLORIDE 0.9 % IV SOLN
200.0000 mg | Freq: Once | INTRAVENOUS | Status: AC
  Administered 2019-11-05: 200 mg via INTRAVENOUS
  Filled 2019-11-05: qty 200

## 2019-11-05 MED ORDER — SODIUM CHLORIDE 0.9 % IV SOLN
100.0000 mg | Freq: Every day | INTRAVENOUS | Status: AC
  Administered 2019-11-06 – 2019-11-09 (×4): 100 mg via INTRAVENOUS
  Filled 2019-11-05 (×4): qty 100

## 2019-11-05 MED ORDER — ASCORBIC ACID 500 MG PO TABS
500.0000 mg | ORAL_TABLET | Freq: Two times a day (BID) | ORAL | Status: DC
  Administered 2019-11-05 – 2019-11-09 (×8): 500 mg via ORAL
  Filled 2019-11-05 (×9): qty 1

## 2019-11-05 MED ORDER — VITAMIN D3 25 MCG (1000 UNIT) PO TABS
1000.0000 [IU] | ORAL_TABLET | Freq: Every day | ORAL | Status: DC
  Administered 2019-11-05 – 2019-11-08 (×4): 1000 [IU] via ORAL
  Filled 2019-11-05 (×8): qty 1

## 2019-11-05 MED ORDER — SODIUM CHLORIDE 0.9 % IV SOLN
500.0000 mg | INTRAVENOUS | Status: DC
  Administered 2019-11-06 – 2019-11-08 (×4): 500 mg via INTRAVENOUS
  Filled 2019-11-05 (×5): qty 500

## 2019-11-05 MED ORDER — NONFORMULARY OR COMPOUNDED ITEM: 1.0000 [drp] | Freq: Every day | Status: DC

## 2019-11-05 MED ORDER — BRIMONIDINE TARTRATE 0.2 % OP SOLN
1.0000 [drp] | Freq: Every day | OPHTHALMIC | Status: DC
  Administered 2019-11-06 – 2019-11-09 (×4): 1 [drp] via OPHTHALMIC
  Filled 2019-11-05: qty 5

## 2019-11-05 MED ORDER — POTASSIUM CHLORIDE CRYS ER 20 MEQ PO TBCR
40.0000 meq | EXTENDED_RELEASE_TABLET | Freq: Once | ORAL | Status: AC
  Administered 2019-11-05: 40 meq via ORAL
  Filled 2019-11-05: qty 2

## 2019-11-05 MED ORDER — SODIUM CHLORIDE 0.9 % IV SOLN
2.0000 g | INTRAVENOUS | Status: DC
  Administered 2019-11-05 – 2019-11-08 (×4): 2 g via INTRAVENOUS
  Filled 2019-11-05 (×3): qty 2
  Filled 2019-11-05 (×2): qty 20

## 2019-11-05 MED ORDER — ZINC SULFATE 220 (50 ZN) MG PO CAPS
220.0000 mg | ORAL_CAPSULE | Freq: Every day | ORAL | Status: DC
  Administered 2019-11-05 – 2019-11-09 (×5): 220 mg via ORAL
  Filled 2019-11-05 (×6): qty 1

## 2019-11-05 MED ORDER — SODIUM CHLORIDE 0.9 % IV SOLN: 2.0000 g | INTRAVENOUS | Status: DC

## 2019-11-05 MED ORDER — DEXAMETHASONE SODIUM PHOSPHATE 4 MG/ML IJ SOLN
4.0000 mg | Freq: Two times a day (BID) | INTRAMUSCULAR | Status: DC
  Administered 2019-11-05 – 2019-11-09 (×9): 4 mg via INTRAVENOUS
  Filled 2019-11-05 (×10): qty 1

## 2019-11-05 MED ORDER — ENOXAPARIN SODIUM 40 MG/0.4ML ~~LOC~~ SOLN
40.0000 mg | SUBCUTANEOUS | Status: DC
  Administered 2019-11-05: 40 mg via SUBCUTANEOUS
  Filled 2019-11-05 (×4): qty 0.4

## 2019-11-05 MED ORDER — DORZOLAMIDE HCL 2 % OP SOLN
1.0000 [drp] | Freq: Every day | OPHTHALMIC | Status: DC
  Administered 2019-11-06 – 2019-11-09 (×4): 1 [drp] via OPHTHALMIC
  Filled 2019-11-05: qty 10

## 2019-11-05 MED ORDER — AMLODIPINE BESYLATE 5 MG PO TABS
5.0000 mg | ORAL_TABLET | Freq: Every day | ORAL | Status: DC
  Administered 2019-11-06 – 2019-11-09 (×4): 5 mg via ORAL
  Filled 2019-11-05 (×5): qty 1

## 2019-11-05 MED ORDER — SODIUM CHLORIDE 0.9 % IV SOLN: INTRAVENOUS | Status: AC | PRN

## 2019-11-05 NOTE — ED Notes (Signed)
Patient gives permission to update daughter, Peiton Anselmo, on her condition.

## 2019-11-05 NOTE — Consult Note (Signed)
Remdesivir - Pharmacy Brief Note   O:   ALT: N/A  CXR: Positive - Patchy airspace opacities which could represent viral pneumonia.  SpO2: 94% on Room Air   COVID positive    A/P:  Remdesivir 200 mg IVPB once followed by 100 mg IVPB daily x 4 days.   Kristeen Miss, PharmD Clinical Pharmacist  11/05/2019 12:57 PM

## 2019-11-05 NOTE — H&P (Signed)
History and Physical    Brandy Ewing [id number] DOB: [birth year] DOA: 11/04/2019  PCP: [facility]    Chief Complaint: cough, fever, SOB  HPI: Brandy Ewing is a 68 y.o. female with medical history difficult for hypertension and glaucoma who presents to the emergency room with a 4-day history of fever and shortness of breath.  He also reports a decreased appetite and generalized malaise.  Says she was exposed to someone with Covid.  Arrival in the emergency room, she had a temperature of 101.7.  O2 sat was 94% O2 via nasal cannula at 2 L/min.  Heart rate was 103.  Patient's 22.  White cell count was 13.4 she had some minor electrolyte disturbances with potassium of 3.2, creatinine 105.  This x-ray showed patchy airspace opacities could represent viral pneumonia.  Show Covid test was negative, PCR pending.  Acid also pending.  She was started on IV azithromycin, ceftriaxone.  TRH consulted for admission.  Review of Systems: As per HPI otherwise 10 point review of systems negative.    Past Medical History:  Diagnosis Date  . Glaucoma     Past Surgical History:  Procedure Laterality Date  . CESAREAN SECTION       reports that she has never smoked. She has never used smokeless tobacco. She reports that she does not drink alcohol or use drugs.  No Known Allergies  Family History  Problem Relation Age of Onset  . Asthma Mother   . Asthma Brother     Prior to Admission medications   Medication Sig Start Date End Date Taking? Authorizing Provider  amLODipine (NORVASC) 5 MG tablet  02/01/18  Yes [provider]  aspirin EC 81 MG tablet Take 81 mg by mouth daily.  02/02/18  Yes [provider]  dorzolamide (TRUSOPT) 2 % ophthalmic solution Place 1 drop into both eyes 2 (two) times daily.  10/27/17   [provider]  LUMIGAN 0.01 % SOLN Place 1 drop into both eyes at bedtime. 11/29/17   [provider]  meclizine (ANTIVERT) 25 MG  tablet Take 1 tablet (25 mg total) by mouth 3 (three) times daily as needed for dizziness. Patient not taking: Reported on 11/04/2019 01/24/18   Nance Pear, MD    Physical Exam: Vitals:   11/04/19 2200 11/04/19 2215 11/04/19 2330 11/05/19 0013  BP: 112/62  122/62 120/69  Pulse: 87  84 83  Resp: (!) 25  (!) 25 20  Temp:  99.5 F (37.5 C)    TempSrc:  Oral    SpO2: 94%  96% 95%  Weight:      Height:        Constitutional: NAD, calm, comfortable Vitals:   11/04/19 2200 11/04/19 2215 11/04/19 2330 11/05/19 0013  BP: 112/62  122/62 120/69  Pulse: 87  84 83  Resp: (!) 25  (!) 25 20  Temp:  99.5 F (37.5 C)    TempSrc:  Oral    SpO2: 94%  96% 95%  Weight:      Height:       Eyes: PERRL Neck: normal, supple, no masses, no thyromegaly Respiratory: crackles bilateral lung fields Cardiovascular: Regular rate and rhythm, no murmurs / rubs / gallops. No extremity edema.   Abdomen: no tenderness, no masses palpated. Musculoskeletal: no clubbing / cyanosis. Skin: no rashes,  Neurologic: grossly intact Psychiatric: Normal judgment and insight. Alert and oriented x 3. Normal mood.    Labs on Admission: I have personally reviewed  following labs and imaging studies  CBC: Recent Labs  Lab 11/04/19 2104  WBC 13.4*  HGB 12.9  HCT 38.9  MCV 89.0  PLT 123456   Basic Metabolic Panel: Recent Labs  Lab 11/04/19 2104  NA 141  K 3.2*  CL 105  CO2 22  GLUCOSE 113*  BUN 16  CREATININE 1.05*  CALCIUM 9.0   GFR: Estimated Creatinine Clearance: 46.3 mL/min (A) (by C-G formula based on SCr of 1.05 mg/dL (H)). Liver Function Tests: No results for input(s): AST, ALT, ALKPHOS, BILITOT, PROT, ALBUMIN in the last 168 hours. No results for input(s): LIPASE, AMYLASE in the last 168 hours. No results for input(s): AMMONIA in the last 168 hours. Coagulation Profile: No results for input(s): INR, PROTIME in the last 168 hours. Cardiac Enzymes: No results for input(s): CKTOTAL, CKMB,  CKMBINDEX, TROPONINI in the last 168 hours. BNP (last 3 results) No results for input(s): PROBNP in the last 8760 hours. HbA1C: No results for input(s): HGBA1C in the last 72 hours. CBG: No results for input(s): GLUCAP in the last 168 hours. Lipid Profile: No results for input(s): CHOL, HDL, LDLCALC, TRIG, CHOLHDL, LDLDIRECT in the last 72 hours. Thyroid Function Tests: No results for input(s): TSH, T4TOTAL, FREET4, T3FREE, THYROIDAB in the last 72 hours. Anemia Panel: No results for input(s): VITAMINB12, FOLATE, FERRITIN, TIBC, IRON, RETICCTPCT in the last 72 hours. Urine analysis: No results found for: COLORURINE, APPEARANCEUR, [place], [place], [place], [place], BILIRUBINUR, KETONESUR, PROTEINUR, UROBILINOGEN, NITRITE, LEUKOCYTESUR  Radiological Exams on Admission: DG Chest Portable 1 View  Result Date: 11/04/2019 CLINICAL DATA:  Shortness of breath, possible COVID-19 exposure EXAM: PORTABLE CHEST 1 VIEW COMPARISON:  03/21/2011 FINDINGS: Cardiac shadows within normal limits but accentuated by the portable technique. Lungs are well aerated bilaterally. Patchy airspace opacities are noted consistent with atypical/viral pneumonia. No sizable effusion is noted. No bony abnormality is noted. IMPRESSION: Patchy airspace opacities which could represent viral pneumonia. Electronically Signed   By: Inez Catalina M.D.   On: 11/04/2019 21:28     Assessment/Plan    CAP with hypoxemia secondary to Suspected COVID-19 virus infection --- Patient presented with a 4-day history of fever, productive cough and shortness of breath with history of exposure to Covid positive patient --- Rapid Covid test negative, currently awaiting results of PCR --- Influenza swab and respiratory panel --- Continue IV Rocephin and azithromycin started in the emergency room --- Supplemental oxygen --- Patient has no O2 requirement at this time so will hold off on empiric Covid test pending results of PCR, likely in the  a.m.   essential hypertension --- Pressure controlled.  Continue home meds    Glaucoma ---continue home meds     Athena Masse MD [facility]   If 7PM-7AM, please contact night-coverage [email or web address] Password [facility]  11/05/2019, 12:35 AM

## 2019-11-05 NOTE — ED Notes (Signed)
In house transportation request placed.

## 2019-11-05 NOTE — Progress Notes (Addendum)
PROGRESS NOTE    Brandy Ewing  [id number] DOB: June 25, 1951 DOA: 11/04/2019 PCP: [facility]   Brief Narrative:  Brandy Ewing is a 68 y.o. female with medical history  significant for hypertension and glaucoma who presents to the emergency room with a 4-day history of fever and shortness of breath.  He also reports a decreased appetite and generalized malaise.  She also has a positive contact with COVID-19 patient.  She was found to be positive for COVID-19 with elevated inflammatory markers and bilateral opacities on chest x-ray.  Respiratory viral panel negative. Started on remdesivir and Decadron.  Subjective: She was feeling better when seen during morning rounds.  Denies any shortness of breath and saturating well on room air.  Assessment & Plan:   Principal Problem:   Suspected COVID-19 virus infection Active Problems:   Essential hypertension   Hypoxemia   CAP (community acquired pneumonia)   Glaucoma  COVID-19 pneumonia. Patient is positive for COVID-19.  Inflammatory markers are elevated with D-dimer above 4000, elevated fibrinogen and ferritin.  CRP is pending. Procalcitonin is positive at 0.19. Saturating well on room air. -Start her on remdesivir and Decadron. -Continue azithromycin. -Supplemental oxygen as needed. -Start her on vitamin C, vitamin D and zinc supplement.  UTI.  Patient also has mildly increased urinary urgency and frequency.  UA with leukocytes. -Get urine culture. -Continue with ceftriaxone which was started for CAP coverage.  We will discontinue antibiotics if urine culture is negative.  Hypokalemia.  Potassium at 3.2 -Replete electrolytes and monitor.  Hypertension.  Blood pressure mildly elevated.  Glaucoma. -Continue home meds.  Objective: Vitals:   11/05/19 0830 11/05/19 0845 11/05/19 0900 11/05/19 0926  BP: 139/71  138/66 138/66  Pulse: 90 85 86 92  Resp: 20 (!) 28 (!) 25 19  Temp:    99.4 F (37.4 C)    TempSrc:    Oral  SpO2: 96% 94% 95% 94%  Weight:      Height:        Intake/Output Summary (Last 24 hours) at 11/05/2019 1331 Last data filed at 11/05/2019 0300 Gross per 24 hour  Intake 1315.22 ml  Output --  Net 1315.22 ml   Filed Weights   11/04/19 2055  Weight: 68.9 kg    Examination:  General exam: Appears calm and comfortable  Respiratory system: Clear to auscultation. Respiratory effort normal. Cardiovascular system: S1 & S2 heard, RRR. No JVD, murmurs, rubs, gallops or clicks. Gastrointestinal system: Soft, nontender, nondistended, bowel sounds positive. Central nervous system: Alert and oriented. No focal neurological deficits.Symmetric 5 x 5 power. Extremities: No edema, no cyanosis, pulses intact and symmetrical. Skin: No rashes, lesions or ulcers Psychiatry: Judgement and insight appear normal. Mood & affect appropriate.    DVT prophylaxis: Lovenox. Code Status: Full Family Communication: Updated the patient. Disposition Plan: Pending improvement and completion of 5-day of remdesivir.  Consultants:   None  Procedures:  Antimicrobials:  Azithromycin Ceftriaxone  Data Reviewed: I have personally reviewed following labs and imaging studies  CBC: Recent Labs  Lab 11/04/19 2104  WBC 13.4*  HGB 12.9  HCT 38.9  MCV 89.0  PLT 123456   Basic Metabolic Panel: Recent Labs  Lab 11/04/19 2104 11/05/19 0934  NA 141  --   K 3.2*  --   CL 105  --   CO2 22  --   GLUCOSE 113*  --   BUN 16  --   CREATININE 1.05*  --   CALCIUM 9.0  --  MG  --  2.2   GFR: Estimated Creatinine Clearance: 46.3 mL/min (A) (by C-G formula based on SCr of 1.05 mg/dL (H)). Liver Function Tests: No results for input(s): AST, ALT, ALKPHOS, BILITOT, PROT, ALBUMIN in the last 168 hours. No results for input(s): LIPASE, AMYLASE in the last 168 hours. No results for input(s): AMMONIA in the last 168 hours. Coagulation Profile: No results for input(s): INR, PROTIME in the last  168 hours. Cardiac Enzymes: No results for input(s): CKTOTAL, CKMB, CKMBINDEX, TROPONINI in the last 168 hours. BNP (last 3 results) No results for input(s): PROBNP in the last 8760 hours. HbA1C: No results for input(s): HGBA1C in the last 72 hours. CBG: No results for input(s): GLUCAP in the last 168 hours. Lipid Profile: No results for input(s): CHOL, HDL, LDLCALC, TRIG, CHOLHDL, LDLDIRECT in the last 72 hours. Thyroid Function Tests: No results for input(s): TSH, T4TOTAL, FREET4, T3FREE, THYROIDAB in the last 72 hours. Anemia Panel: Recent Labs    11/05/19 0934  FERRITIN 416*   Sepsis Labs: Recent Labs  Lab 11/05/19 0007 11/05/19 0934  PROCALCITON  --  0.19  LATICACIDVEN 0.9  --     Recent Results (from the past 240 hour(s))  SARS CORONAVIRUS 2 (TAT 6-24 HRS) Nasopharyngeal Nasopharyngeal Swab     Status: Abnormal   Collection Time: 11/05/19 12:07 AM   Specimen: Nasopharyngeal Swab  Result Value Ref Range Status   SARS Coronavirus 2 POSITIVE (A) NEGATIVE Final    Comment: RESULT CALLED TO, READ BACK BY AND VERIFIED WITH: Henderson Newcomer RN 12:40 11/05/19 ([email or web address]) (NOTE) SARS-CoV-2 target nucleic acids are DETECTED. The SARS-CoV-2 RNA is generally detectable in upper and lower respiratory specimens during the acute phase of infection. Positive results are indicative of the presence of SARS-CoV-2 RNA. Clinical correlation with patient history and other diagnostic information is  necessary to determine patient infection status. Positive results do not rule out bacterial infection or co-infection with other viruses.  The expected result is Negative. Fact Sheet for Patients: SugarRoll.be Fact Sheet for Healthcare Providers: https://www.woods-mathews.com/ This test is not yet approved or cleared by the [place] FDA and  has been authorized for detection and/or diagnosis of SARS-CoV-2 by FDA under an Emergency Use Authorization  (EUA). This EUA will remain  in effect (meaning this test can be used) for t he duration of the COVID-19 declaration under Section 564(b)(1) of the Act, 21 U.S.C. section 360bbb-3(b)(1), unless the authorization is terminated or revoked sooner. Performed at [facility], [place] [address], [place], [place] [address]   Respiratory Panel by PCR     Status: None   Collection Time: 11/05/19 12:07 AM   Specimen: Nasopharyngeal Swab; Respiratory  Result Value Ref Range Status   Adenovirus NOT DETECTED NOT DETECTED Final   Coronavirus 229E NOT DETECTED NOT DETECTED Final    Comment: (NOTE) The Coronavirus on the Respiratory Panel, DOES NOT test for the novel  Coronavirus (2019 nCoV)    Coronavirus HKU1 NOT DETECTED NOT DETECTED Final   Coronavirus NL63 NOT DETECTED NOT DETECTED Final   Coronavirus OC43 NOT DETECTED NOT DETECTED Final   Metapneumovirus NOT DETECTED NOT DETECTED Final   Rhinovirus / Enterovirus NOT DETECTED NOT DETECTED Final   Influenza A NOT DETECTED NOT DETECTED Final   Influenza B NOT DETECTED NOT DETECTED Final   Parainfluenza Virus 1 NOT DETECTED NOT DETECTED Final   Parainfluenza Virus 2 NOT DETECTED NOT DETECTED Final   Parainfluenza Virus 3 NOT DETECTED NOT DETECTED Final  Parainfluenza Virus 4 NOT DETECTED NOT DETECTED Final   Respiratory Syncytial Virus NOT DETECTED NOT DETECTED Final   Bordetella pertussis NOT DETECTED NOT DETECTED Final   Chlamydophila pneumoniae NOT DETECTED NOT DETECTED Final   Mycoplasma pneumoniae NOT DETECTED NOT DETECTED Final    Comment: Performed at [facility], [place] [address] [place], [place] [address]  Culture, blood (routine x 2)     Status: None (Preliminary result)   Collection Time: 11/05/19 12:08 AM   Specimen: BLOOD  Result Value Ref Range Status   Specimen Description BLOOD RIGHT ANTECUBITAL  Final   Special Requests   Final    BOTTLES DRAWN AEROBIC AND ANAEROBIC Blood Culture results may not be optimal due to  an excessive volume of blood received in culture bottles   Culture   Final    NO GROWTH < 12 HOURS Performed at [facility], [address], [place], [place] [address]    Report Status PENDING  Incomplete  Culture, blood (routine x 2)     Status: None (Preliminary result)   Collection Time: 11/05/19 12:08 AM   Specimen: BLOOD  Result Value Ref Range Status   Specimen Description BLOOD BLOOD RIGHT WRIST  Final   Special Requests   Final    BOTTLES DRAWN AEROBIC AND ANAEROBIC Blood Culture adequate volume   Culture   Final    NO GROWTH < 12 HOURS Performed at [facility], [address], [place], [place] [address]    Report Status PENDING  Incomplete     Radiology Studies: DG Chest Portable 1 View  Result Date: 11/04/2019 CLINICAL DATA:  Shortness of breath, possible COVID-19 exposure EXAM: PORTABLE CHEST 1 VIEW COMPARISON:  03/21/2011 FINDINGS: Cardiac shadows within normal limits but accentuated by the portable technique. Lungs are well aerated bilaterally. Patchy airspace opacities are noted consistent with atypical/viral pneumonia. No sizable effusion is noted. No bony abnormality is noted. IMPRESSION: Patchy airspace opacities which could represent viral pneumonia. Electronically Signed   By: Inez Catalina M.D.   On: 11/04/2019 21:28    Scheduled Meds: . dexamethasone (DECADRON) injection  4 mg Intravenous Q12H  . enoxaparin (LOVENOX) injection  40 mg Subcutaneous Q24H   Continuous Infusions: . sodium chloride    . azithromycin    . cefTRIAXone (ROCEPHIN)  IV    . remdesivir 200 mg in sodium chloride 0.9% 250 mL IVPB     Followed by  . [START ON 11/06/2019] remdesivir 100 mg in NS 100 mL       LOS: 0 days   Time spent:   Lorella Nimrod, MD [facility] Pager [phone]  If 7PM-7AM, please contact night-coverage [email or web address] Password [facility] 11/05/2019, 1:31 PM   This record has been created using Systems analyst.  Errors have been sought and corrected,but may not always be located. Such creation errors do not reflect on the standard of care.

## 2019-11-05 NOTE — ED Notes (Signed)
ED TO INPATIENT HANDOFF REPORT  ED Nurse Name and Phone #: [phone]  S Name/Age/Gender Brandy Ewing 68 y.o. female Room/Bed: ED26A/ED26A  Code Status   Code Status: Full Code  Home/SNF/Other Home A/Ox4 Is this baseline? Yes   Triage Complete: Triage complete  Chief Complaint CAP (community acquired pneumonia) [J18.9]  Triage Note Pt thinks she was exposed to covid, has had recent test, no results yet. Pt with dizziness and cough. O2 sats per EMS 90-91%, EMS put her on 2L [place], sats up to 98%..    Allergies No Known Allergies  Level of Care/Admitting Diagnosis ED Disposition    ED Disposition Condition [facility]: [place] [[id number]]  Level of Care: Med-Surg [16]  Covid Evaluation: Confirmed COVID Positive  Diagnosis: CAP (community acquired pneumonia) [id number]  Admitting Physician: Athena Masse [id number]  Attending Physician: Athena Masse [id number]  Estimated length of stay: 3 - 4 days  Certification:: I certify this patient will need inpatient services for at least 2 midnights       B Medical/Surgery History Past Medical History:  Diagnosis Date  . Glaucoma    Past Surgical History:  Procedure Laterality Date  . CESAREAN SECTION       A IV Location/Drains/Wounds Patient Lines/Drains/Airways Status   Active Line/Drains/Airways    Name:   Placement date:   Placement time:   Site:   Days:   Peripheral IV 11/04/19 Left Antecubital   11/04/19    2100    Antecubital   1   Peripheral IV 11/05/19 Right Wrist   11/05/19    0017    Wrist   less than 1          Intake/Output Last 24 hours  Intake/Output Summary (Last 24 hours) at 11/05/2019 2040 Last data filed at 11/05/2019 0300 Gross per 24 hour  Intake 1315.22 ml  Output --  Net 1315.22 ml    Labs/Imaging Results for orders placed or performed during the hospital encounter of 11/04/19 (from the past 48 hour(s))  Basic metabolic panel     Status:  Abnormal   Collection Time: 11/04/19  9:04 PM  Result Value Ref Range   Sodium 141 135 - 145 mmol/L   Potassium 3.2 (L) 3.5 - 5.1 mmol/L   Chloride 105 98 - 111 mmol/L   CO2 22 22 - 32 mmol/L   Glucose, Bld 113 (H) 70 - 99 mg/dL   BUN 16 8 - 23 mg/dL   Creatinine, Ser 1.05 (H) 0.44 - 1.00 mg/dL   Calcium 9.0 8.9 - 10.3 mg/dL   GFR calc non Af Amer 56 (L) >60 mL/min   GFR calc Af Amer >60 >60 mL/min   Anion gap 14 5 - 15    Comment: Performed at [facility], [address] [place], [place] [address]  CBC     Status: Abnormal   Collection Time: 11/04/19  9:04 PM  Result Value Ref Range   WBC 13.4 (H) 4.0 - 10.5 K/uL   RBC 4.37 3.87 - 5.11 MIL/uL   Hemoglobin 12.9 12.0 - 15.0 g/dL   HCT 38.9 36.0 - 46.0 %   MCV 89.0 80.0 - 100.0 fL   MCH 29.5 26.0 - 34.0 pg   MCHC 33.2 30.0 - 36.0 g/dL   RDW 13.7 11.5 - 15.5 %   Platelets 225 150 - 400 K/uL   nRBC 0.0 0.0 - 0.2 %    Comment: Performed at  [place], [place] [address]  Troponin I (High Sensitivity)     Status: None   Collection Time: 11/04/19  9:04 PM  Result Value Ref Range   Troponin I (High Sensitivity) 14 <18 ng/L    Comment: (NOTE) Elevated high sensitivity troponin I (hsTnI) values and significant  changes across serial measurements may suggest ACS but many other  chronic and acute conditions are known to elevate hsTnI results.  Refer to the "Links" section for chest pain algorithms and additional  guidance. Performed at [facility], [address] [place], [place] [address]   Lactic acid, plasma     Status: None   Collection Time: 11/05/19 12:07 AM  Result Value Ref Range   Lactic Acid, Venous 0.9 0.5 - 1.9 mmol/L    Comment: Performed at [facility], [place], [place] [address]  Troponin I (High Sensitivity)     Status: Abnormal   Collection Time: 11/05/19 12:07 AM  Result Value Ref Range   Troponin I (High Sensitivity) 19 (H)  <18 ng/L    Comment: (NOTE) Elevated high sensitivity troponin I (hsTnI) values and significant  changes across serial measurements may suggest ACS but many other  chronic and acute conditions are known to elevate hsTnI results.  Refer to the "Links" section for chest pain algorithms and additional  guidance. Performed at [facility], [place], [place] [address]   SARS CORONAVIRUS 2 (TAT 6-24 HRS) Nasopharyngeal Nasopharyngeal Swab     Status: Abnormal   Collection Time: 11/05/19 12:07 AM   Specimen: Nasopharyngeal Swab  Result Value Ref Range   SARS Coronavirus 2 POSITIVE (A) NEGATIVE    Comment: RESULT CALLED TO, READ BACK BY AND VERIFIED WITH: Henderson Newcomer RN 12:40 11/05/19 ([email or web address]) (NOTE) SARS-CoV-2 target nucleic acids are DETECTED. The SARS-CoV-2 RNA is generally detectable in upper and lower respiratory specimens during the acute phase of infection. Positive results are indicative of the presence of SARS-CoV-2 RNA. Clinical correlation with patient history and other diagnostic information is  necessary to determine patient infection status. Positive results do not rule out bacterial infection or co-infection with other viruses.  The expected result is Negative. Fact Sheet for Patients: SugarRoll.be Fact Sheet for Healthcare Providers: https://www.woods-mathews.com/ This test is not yet approved or cleared by the [place] FDA and  has been authorized for detection and/or diagnosis of SARS-CoV-2 by FDA under an Emergency Use Authorization (EUA). This EUA will remain  in effect (meaning this test can be used) for t he duration of the COVID-19 declaration under Section 564(b)(1) of the Act, 21 U.S.C. section 360bbb-3(b)(1), unless the authorization is terminated or revoked sooner. Performed at [facility], [place] [address], [place], [place] [address]   Respiratory Panel by PCR     Status: None    Collection Time: 11/05/19 12:07 AM   Specimen: Nasopharyngeal Swab; Respiratory  Result Value Ref Range   Adenovirus NOT DETECTED NOT DETECTED   Coronavirus 229E NOT DETECTED NOT DETECTED    Comment: (NOTE) The Coronavirus on the Respiratory Panel, DOES NOT test for the novel  Coronavirus (2019 nCoV)    Coronavirus HKU1 NOT DETECTED NOT DETECTED   Coronavirus NL63 NOT DETECTED NOT DETECTED   Coronavirus OC43 NOT DETECTED NOT DETECTED   Metapneumovirus NOT DETECTED NOT DETECTED   Rhinovirus / Enterovirus NOT DETECTED NOT DETECTED   Influenza A NOT DETECTED NOT DETECTED   Influenza B NOT DETECTED NOT  DETECTED   Parainfluenza Virus 1 NOT DETECTED NOT DETECTED   Parainfluenza Virus 2 NOT DETECTED NOT DETECTED   Parainfluenza Virus 3 NOT DETECTED NOT DETECTED   Parainfluenza Virus 4 NOT DETECTED NOT DETECTED   Respiratory Syncytial Virus NOT DETECTED NOT DETECTED   Bordetella pertussis NOT DETECTED NOT DETECTED   Chlamydophila pneumoniae NOT DETECTED NOT DETECTED   Mycoplasma pneumoniae NOT DETECTED NOT DETECTED    Comment: Performed at [facility], [place] [address], [place], [place] [address]  Culture, blood (routine x 2)     Status: None (Preliminary result)   Collection Time: 11/05/19 12:08 AM   Specimen: BLOOD  Result Value Ref Range   Specimen Description BLOOD RIGHT ANTECUBITAL    Special Requests      BOTTLES DRAWN AEROBIC AND ANAEROBIC Blood Culture results may not be optimal due to an excessive volume of blood received in culture bottles   Culture      NO GROWTH < 12 HOURS Performed at [facility], [address] [place], [place] [address]    Report Status PENDING   Culture, blood (routine x 2)     Status: None (Preliminary result)   Collection Time: 11/05/19 12:08 AM   Specimen: BLOOD  Result Value Ref Range   Specimen Description BLOOD BLOOD RIGHT WRIST    Special Requests      BOTTLES DRAWN AEROBIC AND ANAEROBIC Blood Culture adequate volume    Culture      NO GROWTH < 12 HOURS Performed at [facility], [address] [place], [place] [address]    Report Status PENDING   HIV Antibody (routine testing w rflx)     Status: None   Collection Time: 11/05/19 12:12 AM  Result Value Ref Range   HIV Screen 4th Generation wRfx NON REACTIVE NON REACTIVE    Comment: Performed at [place] [address], [place], [place] [address]  Urinalysis, Complete w Microscopic     Status: Abnormal   Collection Time: 11/05/19  9:34 AM  Result Value Ref Range   Color, Urine YELLOW (A) YELLOW   APPearance HAZY (A) CLEAR   Specific Gravity, Urine 1.021 1.005 - 1.030   pH 6.0 5.0 - 8.0   Glucose, UA NEGATIVE NEGATIVE mg/dL   Hgb urine dipstick SMALL (A) NEGATIVE   Bilirubin Urine NEGATIVE NEGATIVE   Ketones, ur 20 (A) NEGATIVE mg/dL   Protein, ur 100 (A) NEGATIVE mg/dL   Nitrite NEGATIVE NEGATIVE   Leukocytes,Ua SMALL (A) NEGATIVE   RBC / HPF 6-10 0 - 5 RBC/hpf   WBC, UA 11-20 0 - 5 WBC/hpf   Bacteria, UA RARE (A) NONE SEEN   Squamous Epithelial / LPF 0-5 0 - 5   Mucus PRESENT    Hyaline Casts, UA PRESENT    Granular Casts, UA PRESENT     Comment: Performed at [facility], [address] [place], [place] [address]  Ferritin     Status: Abnormal   Collection Time: 11/05/19  9:34 AM  Result Value Ref Range   Ferritin 416 (H) 11 - 307 ng/mL    Comment: Performed at [facility], [address] [place],  [address]  Fibrin derivatives D-Dimer [facility] only)     Status: Abnormal   Collection Time: 11/05/19  9:34 AM  Result Value Ref Range   Fibrin derivatives D-dimer ([facility]) 4,217.97 (H) 0.00 - 499.00 ng/mL (FEU)    Comment: (NOTE) <> Exclusion of Venous Thromboembolism (VTE) -  OUTPATIENT ONLY   (Emergency Department or [place])   0-499 ng/ml (FEU): With a low to intermediate pretest probability                      for VTE this test result excludes the diagnosis                      of VTE.    >499 ng/ml (FEU) : VTE not excluded; additional work up for VTE is                      required. <> Testing on Inpatients and Evaluation of Disseminated Intravascular   Coagulation (DIC) Reference Range:   0-499 ng/ml (FEU) Performed at [facility] [address] [place], [place] [address]   Fibrinogen     Status: Abnormal   Collection Time: 11/05/19  9:34 AM  Result Value Ref Range   Fibrinogen >750 (H) 210 - 475 mg/dL    Comment: Performed at [facility], [address] [place], [place] [address]  Procalcitonin - Baseline     Status: None   Collection Time: 11/05/19  9:34 AM  Result Value Ref Range   Procalcitonin 0.19 ng/mL    Comment:        Interpretation: PCT (Procalcitonin) <= 0.5 ng/mL: Systemic infection (sepsis) is not likely. Local bacterial infection is possible. (NOTE)       Sepsis PCT Algorithm           Lower Respiratory Tract                                      Infection PCT Algorithm    ----------------------------     ----------------------------         PCT < 0.25 ng/mL                PCT < 0.10 ng/mL         Strongly encourage             Strongly discourage   discontinuation of antibiotics    initiation of antibiotics    ----------------------------     -----------------------------       PCT 0.25 - 0.50 ng/mL            PCT 0.10 - 0.25 ng/mL               OR       >80% decrease in PCT            Discourage initiation of                                            antibiotics      Encourage discontinuation           of antibiotics    ----------------------------     -----------------------------         PCT >= 0.50 ng/mL              PCT 0.26 - 0.50 ng/mL               AND        <80% decrease in PCT             Encourage initiation  of                                             antibiotics       Encourage continuation           of antibiotics    ----------------------------     -----------------------------        PCT >= 0.50  ng/mL                  PCT > 0.50 ng/mL               AND         increase in PCT                  Strongly encourage                                      initiation of antibiotics    Strongly encourage escalation           of antibiotics                                     -----------------------------                                           PCT <= 0.25 ng/mL                                                 OR                                        > 80% decrease in PCT                                     Discontinue / Do not initiate                                             antibiotics Performed at [facility], [address] [place], [place] [address]   Magnesium     Status: None   Collection Time: 11/05/19  9:34 AM  Result Value Ref Range   Magnesium 2.2 1.7 - 2.4 mg/dL    Comment: Performed at [facility], [address] [place], [place] [address]   DG Chest Portable 1 View  Result Date: 11/04/2019 CLINICAL DATA:  Shortness of breath, possible COVID-19 exposure EXAM: PORTABLE CHEST 1 VIEW COMPARISON:  03/21/2011 FINDINGS: Cardiac shadows within normal limits but accentuated by the portable technique. Lungs are well aerated bilaterally. Patchy airspace opacities are noted consistent with atypical/viral pneumonia. No sizable effusion is noted. No bony abnormality is noted. IMPRESSION: Patchy airspace opacities which  could represent viral pneumonia. Electronically Signed   By: Inez Catalina M.D.   On: 11/04/2019 21:28    Pending Labs Unresulted Labs (From admission, onward)    Start     Ordered   11/12/19 0500  Creatinine, serum  (enoxaparin (LOVENOX)    CrCl >/= 30 ml/min)  Weekly,   STAT    Comments: while on enoxaparin therapy    11/05/19 0056   11/06/19 0500  Procalcitonin  Daily,   STAT     11/05/19 0822   11/06/19 0500  Fibrin derivatives D-Dimer ([place] only)  Daily,   STAT     11/05/19 1349   11/06/19 0500  High sensitivity CRP  Daily,   STAT      11/05/19 1349   11/06/19 0500  CBC  Daily,   STAT     11/05/19 1349   11/06/19 0500  Comprehensive metabolic panel  Daily,   STAT     11/05/19 1349   11/05/19 1230  Urine Culture  Add-on,   AD     11/05/19 1230   11/05/19 0823  High sensitivity CRP  Once,   STAT     11/05/19 0822   11/05/19 0057  Sputum culture  (Non-severe pneumonia (non-ICU care) in adult without resistant organism risk factors.)  Once,   STAT     11/05/19 0056          Vitals/Pain Today's Vitals   11/05/19 1400 11/05/19 1600 11/05/19 1712 11/05/19 2000  BP: 134/70 (!) 142/66  (!) 121/57  Pulse: 87 84  61  Resp: (!) 24 (!) 23  (!) 31  Temp:      TempSrc:      SpO2: 96% 97%  94%  Weight:      Height:      PainSc:   0-No pain     Isolation Precautions Droplet precaution  Medications Medications  enoxaparin (LOVENOX) injection 40 mg (40 mg Subcutaneous Given 11/05/19 0156)  azithromycin (ZITHROMAX) 500 mg in sodium chloride 0.9 % 250 mL IVPB (has no administration in time range)  0.9 %  sodium chloride infusion (has no administration in time range)  dexamethasone (DECADRON) injection 4 mg (4 mg Intravenous Given 11/05/19 1324)  remdesivir 200 mg in sodium chloride 0.9% 250 mL IVPB (0 mg Intravenous Stopped 11/05/19 1712)    Followed by  remdesivir 100 mg in sodium chloride 0.9 % 100 mL IVPB (has no administration in time range)  cefTRIAXone (ROCEPHIN) 2 g in sodium chloride 0.9 % 100 mL IVPB (has no administration in time range)  amLODipine (NORVASC) tablet 5 mg (0 mg Oral Hold 11/05/19 1530)  aspirin EC tablet 81 mg (has no administration in time range)  ascorbic acid (VITAMIN C) tablet 500 mg (has no administration in time range)  zinc sulfate capsule 220 mg (220 mg Oral Given 11/05/19 1530)  cholecalciferol (VITAMIN D) tablet 1,000 Units (1,000 Units Oral Given 11/05/19 1530)  timolol (TIMOPTIC) 0.5 % ophthalmic solution 1 drop (has no administration in time range)    And  brimonidine (ALPHAGAN)  0.2 % ophthalmic solution 1 drop (has no administration in time range)    And  dorzolamide (TRUSOPT) 2 % ophthalmic solution 1 drop (has no administration in time range)  acetaminophen (TYLENOL) tablet 1,000 mg (1,000 mg Oral Given 11/04/19 2108)  lactated ringers bolus 1,000 mL (0 mLs Intravenous Stopped 11/05/19 0019)  cefTRIAXone (ROCEPHIN) 1 g in sodium chloride 0.9 % 100 mL IVPB (0 g Intravenous Stopped 11/05/19 0218)  azithromycin (ZITHROMAX) 500 mg in sodium chloride 0.9 % 250 mL IVPB (0 mg Intravenous Stopped 11/05/19 0300)  potassium chloride SA (KLOR-CON) CR tablet 40 mEq (40 mEq Oral Given 11/05/19 0931)    Mobility walks Low fall risk      R Recommendations: See Admitting Provider Note  Report given to:   Additional Notes:

## 2019-11-05 NOTE — ED Notes (Signed)
Dinner tray/water provided to pt by this Therapist, sports.

## 2019-11-06 ENCOUNTER — Encounter: Payer: Self-pay | Admitting: Internal Medicine

## 2019-11-06 DIAGNOSIS — Z20828 Contact with and (suspected) exposure to other viral communicable diseases: Secondary | ICD-10-CM

## 2019-11-06 LAB — CBC
HCT: 37.1 % (ref 36.0–46.0)
Hemoglobin: 12.2 g/dL (ref 12.0–15.0)
MCH: 29.5 pg (ref 26.0–34.0)
MCHC: 32.9 g/dL (ref 30.0–36.0)
MCV: 89.8 fL (ref 80.0–100.0)
Platelets: 245 10*3/uL (ref 150–400)
RBC: 4.13 MIL/uL (ref 3.87–5.11)
RDW: 14 % (ref 11.5–15.5)
WBC: 7.6 10*3/uL (ref 4.0–10.5)
nRBC: 0 % (ref 0.0–0.2)

## 2019-11-06 LAB — COMPREHENSIVE METABOLIC PANEL
ALT: 16 U/L (ref 0–44)
AST: 22 U/L (ref 15–41)
Albumin: 2.9 g/dL — ABNORMAL LOW (ref 3.5–5.0)
Alkaline Phosphatase: 66 U/L (ref 38–126)
Anion gap: 12 (ref 5–15)
BUN: 20 mg/dL (ref 8–23)
CO2: 21 mmol/L — ABNORMAL LOW (ref 22–32)
Calcium: 9.2 mg/dL (ref 8.9–10.3)
Chloride: 109 mmol/L (ref 98–111)
Creatinine, Ser: 0.61 mg/dL (ref 0.44–1.00)
GFR calc Af Amer: >60 mL/min (ref >60)
GFR calc non Af Amer: >60 mL/min (ref >60)
Glucose, Bld: 116 mg/dL — ABNORMAL HIGH (ref 70–99)
Potassium: 4.1 mmol/L (ref 3.5–5.1)
Sodium: 142 mmol/L (ref 135–145)
Total Bilirubin: 0.7 mg/dL (ref 0.3–1.2)
Total Protein: 7.2 g/dL (ref 6.5–8.1)

## 2019-11-06 LAB — PROCALCITONIN: Procalcitonin: 0.13 ng/mL

## 2019-11-06 LAB — URINE CULTURE

## 2019-11-06 LAB — FIBRIN DERIVATIVES D-DIMER (ARMC ONLY): Fibrin derivatives D-dimer (ARMC): 3153.38 ng/mL (FEU) — ABNORMAL HIGH (ref 0.00–499.00)

## 2019-11-06 LAB — HIGH SENSITIVITY CRP: CRP, High Sensitivity: 168.18 mg/L — ABNORMAL HIGH (ref 0.00–3.00)

## 2019-11-06 MED ORDER — SODIUM CHLORIDE 0.9 % IV SOLN
INTRAVENOUS | Status: DC | PRN
  Administered 2019-11-06 (×3): 250 mL via INTRAVENOUS

## 2019-11-06 NOTE — Progress Notes (Signed)
PROGRESS NOTE    Brandy Ewing  [id number] DOB: August 04, 1951 DOA: 11/04/2019 PCP: [facility]   Brief Narrative:  Brandy Ewing is a 68 y.o. female with medical history  significant for hypertension and glaucoma who presents to the emergency room with a 4-day history of fever and shortness of breath.  She also reports a decreased appetite and generalized malaise.  She also has a positive contact with COVID-19 patient.  She was found to be positive for COVID-19 with elevated inflammatory markers and bilateral opacities on chest x-ray.  Respiratory viral panel negative. Started on remdesivir and Decadron.  Subjective: She was feeling better when seen during morning rounds.  Denies any shortness of breath and saturating well on room air.  Assessment & Plan:   Principal Problem:   Suspected COVID-19 virus infection Active Problems:   Essential hypertension   Hypoxemia   CAP (community acquired pneumonia)   Glaucoma  COVID-19 pneumonia. Patient is positive for COVID-19.  Inflammatory markers are elevated with D-dimer above 4000, elevated fibrinogen and ferritin.  CRP 168. Procalcitonin is positive at 0.19. Inflammatory markers started improving today, repeat CRP pending. Saturating well on room air. -Continue remdesivir and Decadron-day 2 -Continue azithromycin. -Supplemental oxygen as needed. -Continue vitamin C, vitamin D and zinc supplement.  UTI.  Patient also has mildly increased urinary urgency and frequency.  UA with leukocytes. - urine culture-pending results -Continue with ceftriaxone which was started for CAP coverage.  We will discontinue antibiotics if urine culture is negative.  Hypokalemia.  Resolved with repletion. -Replete electrolytes as needed and monitor.  Hypertension.  Blood pressure mildly elevated. -Continue home amlodipine.  Glaucoma. -Continue home meds.  Objective: Vitals:   11/05/19 2003 11/05/19 2220 11/06/19 0450 11/06/19  1155  BP:  135/78 (!) 145/69 139/74  Pulse:  80 61 62  Resp:  20 16 18   Temp: 98.7 F (37.1 C) 98.5 F (36.9 C) 98 F (36.7 C) 98 F (36.7 C)  TempSrc: Oral Oral Oral Oral  SpO2:  93% 96% 96%  Weight:  69.7 kg    Height:  5' (1.524 m)      Intake/Output Summary (Last 24 hours) at 11/06/2019 1448 Last data filed at 11/06/2019 0400 Gross per 24 hour  Intake 350 ml  Output 600 ml  Net -250 ml   Filed Weights   11/04/19 2055 11/05/19 2220  Weight: 68.9 kg 69.7 kg    Examination:  General exam: Appears calm and comfortable  Respiratory system: Clear to auscultation. Respiratory effort normal. Cardiovascular system: S1 & S2 heard, RRR. No JVD, murmurs, rubs, gallops or clicks. Gastrointestinal system: Soft, nontender, nondistended, bowel sounds positive. Central nervous system: Alert and oriented. No focal neurological deficits.Symmetric 5 x 5 power. Extremities: No edema, no cyanosis, pulses intact and symmetrical. Skin: No rashes, lesions or ulcers Psychiatry: Judgement and insight appear normal. Mood & affect appropriate.    DVT prophylaxis: Lovenox. Code Status: Full Family Communication: Updated the patient. Disposition Plan: Pending improvement and completion of 5-day of remdesivir.  Consultants:   None  Procedures:  Antimicrobials:  Azithromycin Ceftriaxone  Data Reviewed: I have personally reviewed following labs and imaging studies  CBC: Recent Labs  Lab 11/04/19 2104 11/06/19 0553  WBC 13.4* 7.6  HGB 12.9 12.2  HCT 38.9 37.1  MCV 89.0 89.8  PLT 225 [id number]   Basic Metabolic Panel: Recent Labs  Lab 11/04/19 2104 11/05/19 0934 11/06/19 0553  NA 141  --  142  K 3.2*  --  4.1  CL 105  --  109  CO2 22  --  21*  GLUCOSE 113*  --  116*  BUN 16  --  20  CREATININE 1.05*  --  0.61  CALCIUM 9.0  --  9.2  MG  --  2.2  --    GFR: Estimated Creatinine Clearance: 61.1 mL/min (by C-G formula based on SCr of 0.61 mg/dL). Liver Function  Tests: Recent Labs  Lab 11/06/19 0553  AST 22  ALT 16  ALKPHOS 66  BILITOT 0.7  PROT 7.2  ALBUMIN 2.9*   No results for input(s): LIPASE, AMYLASE in the last 168 hours. No results for input(s): AMMONIA in the last 168 hours. Coagulation Profile: No results for input(s): INR, PROTIME in the last 168 hours. Cardiac Enzymes: No results for input(s): CKTOTAL, CKMB, CKMBINDEX, TROPONINI in the last 168 hours. BNP (last 3 results) No results for input(s): PROBNP in the last 8760 hours. HbA1C: No results for input(s): HGBA1C in the last 72 hours. CBG: No results for input(s): GLUCAP in the last 168 hours. Lipid Profile: No results for input(s): CHOL, HDL, LDLCALC, TRIG, CHOLHDL, LDLDIRECT in the last 72 hours. Thyroid Function Tests: No results for input(s): TSH, T4TOTAL, FREET4, T3FREE, THYROIDAB in the last 72 hours. Anemia Panel: Recent Labs    11/05/19 0934  FERRITIN 416*   Sepsis Labs: Recent Labs  Lab 11/05/19 0007 11/05/19 0934 11/06/19 0553  PROCALCITON  --  0.19 0.13  LATICACIDVEN 0.9  --   --     Recent Results (from the past 240 hour(s))  SARS CORONAVIRUS 2 (TAT 6-24 HRS) Nasopharyngeal Nasopharyngeal Swab     Status: Abnormal   Collection Time: 11/05/19 12:07 AM   Specimen: Nasopharyngeal Swab  Result Value Ref Range Status   SARS Coronavirus 2 POSITIVE (A) NEGATIVE Final    Comment: RESULT CALLED TO, READ BACK BY AND VERIFIED WITH: Henderson Newcomer RN 12:40 11/05/19 ([email or web address]) (NOTE) SARS-CoV-2 target nucleic acids are DETECTED. The SARS-CoV-2 RNA is generally detectable in upper and lower respiratory specimens during the acute phase of infection. Positive results are indicative of the presence of SARS-CoV-2 RNA. Clinical correlation with patient history and other diagnostic information is  necessary to determine patient infection status. Positive results Ewing not rule out bacterial infection or co-infection with other viruses.  The expected result is  Negative. Fact Sheet for Patients: SugarRoll.be Fact Sheet for Healthcare Providers: https://www.woods-mathews.com/ This test is not yet approved or cleared by the [place] FDA and  has been authorized for detection and/or diagnosis of SARS-CoV-2 by FDA under an Emergency Use Authorization (EUA). This EUA will remain  in effect (meaning this test can be used) for t he duration of the COVID-19 declaration under Section 564(b)(1) of the Act, 21 U.S.C. section 360bbb-3(b)(1), unless the authorization is terminated or revoked sooner. Performed at [facility], [place] [address] [place], [place] [address]   Respiratory Panel by PCR     Status: None   Collection Time: 11/05/19 12:07 AM   Specimen: Nasopharyngeal Swab; Respiratory  Result Value Ref Range Status   Adenovirus NOT DETECTED NOT DETECTED Final   Coronavirus 229E NOT DETECTED NOT DETECTED Final    Comment: (NOTE) The Coronavirus on the Respiratory Panel, DOES NOT test for the novel  Coronavirus (2019 nCoV)    Coronavirus HKU1 NOT DETECTED NOT DETECTED Final   Coronavirus NL63 NOT DETECTED NOT DETECTED Final   Coronavirus OC43 NOT DETECTED NOT DETECTED Final   Metapneumovirus NOT  DETECTED NOT DETECTED Final   Rhinovirus / Enterovirus NOT DETECTED NOT DETECTED Final   Influenza A NOT DETECTED NOT DETECTED Final   Influenza B NOT DETECTED NOT DETECTED Final   Parainfluenza Virus 1 NOT DETECTED NOT DETECTED Final   Parainfluenza Virus 2 NOT DETECTED NOT DETECTED Final   Parainfluenza Virus 3 NOT DETECTED NOT DETECTED Final   Parainfluenza Virus 4 NOT DETECTED NOT DETECTED Final   Respiratory Syncytial Virus NOT DETECTED NOT DETECTED Final   Bordetella pertussis NOT DETECTED NOT DETECTED Final   Chlamydophila pneumoniae NOT DETECTED NOT DETECTED Final   Mycoplasma pneumoniae NOT DETECTED NOT DETECTED Final    Comment: Performed at [facility], [place] [address]  [place], [place] [address]  Culture, blood (routine x 2)     Status: None (Preliminary result)   Collection Time: 11/05/19 12:08 AM   Specimen: BLOOD  Result Value Ref Range Status   Specimen Description BLOOD RIGHT ANTECUBITAL  Final   Special Requests   Final    BOTTLES DRAWN AEROBIC AND ANAEROBIC Blood Culture results may not be optimal due to an excessive volume of blood received in culture bottles   Culture   Final    NO GROWTH 1 DAY Performed at [facility], [address] [place], [place] [address]    Report Status PENDING  Incomplete  Culture, blood (routine x 2)     Status: None (Preliminary result)   Collection Time: 11/05/19 12:08 AM   Specimen: BLOOD  Result Value Ref Range Status   Specimen Description BLOOD BLOOD RIGHT WRIST  Final   Special Requests   Final    BOTTLES DRAWN AEROBIC AND ANAEROBIC Blood Culture adequate volume   Culture   Final    NO GROWTH 1 DAY Performed at [facility], [address], [place], [place] [address]    Report Status PENDING  Incomplete     Radiology Studies: DG Chest Portable 1 View  Result Date: 11/04/2019 CLINICAL DATA:  Shortness of breath, possible COVID-19 exposure EXAM: PORTABLE CHEST 1 VIEW COMPARISON:  03/21/2011 FINDINGS: Cardiac shadows within normal limits but accentuated by the portable technique. Lungs are well aerated bilaterally. Patchy airspace opacities are noted consistent with atypical/viral pneumonia. No sizable effusion is noted. No bony abnormality is noted. IMPRESSION: Patchy airspace opacities which could represent viral pneumonia. Electronically Signed   By: Inez Catalina M.D.   On: 11/04/2019 21:28    Scheduled Meds: . amLODipine  5 mg Oral Daily  . vitamin C  500 mg Oral BID  . aspirin EC  81 mg Oral Daily  . timolol  1 drop Both Eyes Daily   And  . brimonidine  1 drop Both Eyes Daily   And  . dorzolamide  1 drop Both Eyes Daily  . cholecalciferol  1,000 Units Oral Daily  .  dexamethasone (DECADRON) injection  4 mg Intravenous Q12H  . enoxaparin (LOVENOX) injection  40 mg Subcutaneous Q24H  . zinc sulfate  220 mg Oral Daily   Continuous Infusions: . sodium chloride 250 mL (11/06/19 1012)  . azithromycin 500 mg (11/06/19 0106)  . cefTRIAXone (ROCEPHIN)  IV 2 g (11/05/19 2242)  . remdesivir 100 mg in NS 100 mL 100 mg (11/06/19 1015)     LOS: 1 day   Time spent: 40 minutes  Lorella Nimrod, MD [facility] Pager [phone]  If 7PM-7AM, please contact night-coverage [email or web address] Password [facility] 11/06/2019, 2:48 PM   This record has been created using  [employer]agon [employer] forces training and education officer. Errors have been sought and corrected,but may not always be located. Such creation errors Ewing not reflect on the standard of care.

## 2019-11-07 DIAGNOSIS — I10 Essential (primary) hypertension: Secondary | ICD-10-CM

## 2019-11-07 DIAGNOSIS — R0902 Hypoxemia: Secondary | ICD-10-CM

## 2019-11-07 LAB — COMPREHENSIVE METABOLIC PANEL
ALT: 17 U/L (ref 0–44)
AST: 21 U/L (ref 15–41)
Albumin: 2.9 g/dL — ABNORMAL LOW (ref 3.5–5.0)
Alkaline Phosphatase: 65 U/L (ref 38–126)
Anion gap: 12 (ref 5–15)
BUN: 21 mg/dL (ref 8–23)
CO2: 21 mmol/L — ABNORMAL LOW (ref 22–32)
Calcium: 9.1 mg/dL (ref 8.9–10.3)
Chloride: 107 mmol/L (ref 98–111)
Creatinine, Ser: 0.73 mg/dL (ref 0.44–1.00)
GFR calc Af Amer: >60 mL/min (ref >60)
GFR calc non Af Amer: >60 mL/min (ref >60)
Glucose, Bld: 141 mg/dL — ABNORMAL HIGH (ref 70–99)
Potassium: 4.1 mmol/L (ref 3.5–5.1)
Sodium: 140 mmol/L (ref 135–145)
Total Bilirubin: 0.6 mg/dL (ref 0.3–1.2)
Total Protein: 6.7 g/dL (ref 6.5–8.1)

## 2019-11-07 LAB — CBC
HCT: 37.8 % (ref 36.0–46.0)
Hemoglobin: 12.6 g/dL (ref 12.0–15.0)
MCH: 29.5 pg (ref 26.0–34.0)
MCHC: 33.3 g/dL (ref 30.0–36.0)
MCV: 88.5 fL (ref 80.0–100.0)
Platelets: 316 10*3/uL (ref 150–400)
RBC: 4.27 MIL/uL (ref 3.87–5.11)
RDW: 13.8 % (ref 11.5–15.5)
WBC: 7.8 10*3/uL (ref 4.0–10.5)
nRBC: 0 % (ref 0.0–0.2)

## 2019-11-07 LAB — PROCALCITONIN: Procalcitonin: <0.1 ng/mL

## 2019-11-07 LAB — FIBRIN DERIVATIVES D-DIMER (ARMC ONLY): Fibrin derivatives D-dimer (ARMC): 3120.79 ng/mL (FEU) — ABNORMAL HIGH (ref 0.00–499.00)

## 2019-11-07 LAB — HIGH SENSITIVITY CRP: CRP, High Sensitivity: 152.84 mg/L — ABNORMAL HIGH (ref 0.00–3.00)

## 2019-11-07 NOTE — Progress Notes (Signed)
PROGRESS NOTE    Brandy Ewing  [id number] DOB: Apr 07, 1951 DOA: 11/04/2019  PCP: [facility]    LOS - 2   Brief Narrative:  68 y.o.femalewith medical history significant for hypertension and glaucoma who presents to the emergency room with a 4-day history of fever and shortness of breath, with associated decreased appetite and generalized malaise.  Reported having had a contact with COVID-19 positive patient.  She was found to be positive for COVID-19 with elevated inflammatory markers and bilateral opacities on chest x-ray.  Respiratory viral panel negative. Started on remdesivir and Decadron.     Subjective 12/16: Patient seen, awake sitting up in bed.  Reports feeling better.  Has been up to bathroom and less short of breath.  Ate breakfast and tolerated.  Denies fever/chills.  States she would not have a ride to infusion clinic.  Assessment & Plan:   Principal Problem:   Suspected COVID-19 virus infection Active Problems:   Essential hypertension   Hypoxemia   CAP (community acquired pneumonia)   Glaucoma   COVID-19 pneumonia. Positive for COVID-19, and inflammatory markers are elevated with D-dimer above 4000, elevated fibrinogen and ferritin.  CRP 168.  Procalcitonin is positive at 0.19.  Inflammatory markers have been improving.  Has not required supplemental oxygen to date. - Continue remdesivir - day 3 of 5 - Continue Decadron - day 3 of 10 - Continue azithromycin - Supplemental oxygen as needed to maintain O2 sat > 90% - Continue vitamin C, vitamin D and zinc   UTI.  UA showed leukocytes, and patient reported mildly increased urinary urgency and frequency.  Started on Rocephin empirically, pending culture results - follow up urine culture - continue Rocephin (initially started for CAP coverage).   - urine culture - sample with multiple species, likely contaminated, recollection recommended but has now been on antibiotics, so unlikely to be  helpful.   - will stop antibiotics after 3 day course and monitor symptoms  Hypokalemia.  Resolved with repletion. -Replete electrolytes as needed and monitor.  Hypertension.  Blood pressure mildly elevated. -Continue home amlodipine.  Glaucoma. -Continue home meds.   DVT prophylaxis: Lovenox   Code Status: Full Code  Family Communication: none at bedside  Disposition Plan:  Expect d/c home after day 5 of remdesevir on 12/18, pending clinical improvement   Consultants:   None  Procedures:   None  Antimicrobials:   Rocephin  Zithromax    Objective: Vitals:   11/06/19 1155 11/06/19 2050 11/07/19 0539 11/07/19 1145  BP: 139/74 122/78 124/68 115/88  Pulse: 62 64 (!) 59 76  Resp: 18 18 18    Temp: 98 F (36.7 C) 98 F (36.7 C) 98 F (36.7 C) (!) 97.4 F (36.3 C)  TempSrc: Oral Oral Oral Oral  SpO2: 96% 98% 94% 97%  Weight:      Height:        Intake/Output Summary (Last 24 hours) at 11/07/2019 1219 Last data filed at 11/07/2019 0817 Gross per 24 hour  Intake 479.9 ml  Output 1550 ml  Net -1070.1 ml   Filed Weights   11/04/19 2055 11/05/19 2220  Weight: 68.9 kg 69.7 kg    Examination:  General exam: awake, alert, no acute distress Respiratory system: clear to auscultation bilaterally, no wheezes, rales or rhonchi, normal respiratory effort. Cardiovascular system: normal S1/S2, RRR, no JVD, murmurs, rubs, gallops, no pedal edema.   Gastrointestinal system: soft, non-tender, non-distended abdomen Central nervous system: alert and oriented x4. no gross focal  neurologic deficits, normal speech Extremities: moves all, no edema, normal tone Skin: dry, intact, normal temperature Psychiatry: normal mood, congruent affect, judgement and insight appear normal    Data Reviewed: I have personally reviewed following labs and imaging studies  CBC: Recent Labs  Lab 11/04/19 2104 11/06/19 0553 11/07/19 0529  WBC 13.4* 7.6 7.8  HGB 12.9 12.2 12.6  HCT  38.9 37.1 37.8  MCV 89.0 89.8 88.5  PLT 225 245 [id number]   Basic Metabolic Panel: Recent Labs  Lab 11/04/19 2104 11/05/19 0934 11/06/19 0553 11/07/19 0529  NA 141  --  142 140  K 3.2*  --  4.1 4.1  CL 105  --  109 107  CO2 22  --  21* 21*  GLUCOSE 113*  --  116* 141*  BUN 16  --  20 21  CREATININE 1.05*  --  0.61 0.73  CALCIUM 9.0  --  9.2 9.1  MG  --  2.2  --   --    GFR: Estimated Creatinine Clearance: 61.1 mL/min (by C-G formula based on SCr of 0.73 mg/dL). Liver Function Tests: Recent Labs  Lab 11/06/19 0553 11/07/19 0529  AST 22 21  ALT 16 17  ALKPHOS 66 65  BILITOT 0.7 0.6  PROT 7.2 6.7  ALBUMIN 2.9* 2.9*   No results for input(s): LIPASE, AMYLASE in the last 168 hours. No results for input(s): AMMONIA in the last 168 hours. Coagulation Profile: No results for input(s): INR, PROTIME in the last 168 hours. Cardiac Enzymes: No results for input(s): CKTOTAL, CKMB, CKMBINDEX, TROPONINI in the last 168 hours. BNP (last 3 results) No results for input(s): PROBNP in the last 8760 hours. HbA1C: No results for input(s): HGBA1C in the last 72 hours. CBG: No results for input(s): GLUCAP in the last 168 hours. Lipid Profile: No results for input(s): CHOL, HDL, LDLCALC, TRIG, CHOLHDL, LDLDIRECT in the last 72 hours. Thyroid Function Tests: No results for input(s): TSH, T4TOTAL, FREET4, T3FREE, THYROIDAB in the last 72 hours. Anemia Panel: Recent Labs    11/05/19 0934  FERRITIN 416*   Sepsis Labs: Recent Labs  Lab 11/05/19 0007 11/05/19 0934 11/06/19 0553 11/07/19 0529  PROCALCITON  --  0.19 0.13 <0.10  LATICACIDVEN 0.9  --   --   --     Recent Results (from the past 240 hour(s))  SARS CORONAVIRUS 2 (TAT 6-24 HRS) Nasopharyngeal Nasopharyngeal Swab     Status: Abnormal   Collection Time: 11/05/19 12:07 AM   Specimen: Nasopharyngeal Swab  Result Value Ref Range Status   SARS Coronavirus 2 POSITIVE (A) NEGATIVE Final    Comment: RESULT CALLED TO, READ BACK  BY AND VERIFIED WITH: Henderson Newcomer RN 12:40 11/05/19 ([email or web address]) (NOTE) SARS-CoV-2 target nucleic acids are DETECTED. The SARS-CoV-2 RNA is generally detectable in upper and lower respiratory specimens during the acute phase of infection. Positive results are indicative of the presence of SARS-CoV-2 RNA. Clinical correlation with patient history and other diagnostic information is  necessary to determine patient infection status. Positive results do not rule out bacterial infection or co-infection with other viruses.  The expected result is Negative. Fact Sheet for Patients: SugarRoll.be Fact Sheet for Healthcare Providers: https://www.woods-mathews.com/ This test is not yet approved or cleared by the [place] FDA and  has been authorized for detection and/or diagnosis of SARS-CoV-2 by FDA under an Emergency Use Authorization (EUA). This EUA will remain  in effect (meaning this test can be used) for t he duration of the COVID-19 declaration  under Section 564(b)(1) of the Act, 21 U.S.C. section 360bbb-3(b)(1), unless the authorization is terminated or revoked sooner. Performed at [facility], [place] [address] [place], [place] [address]   Respiratory Panel by PCR     Status: None   Collection Time: 11/05/19 12:07 AM   Specimen: Nasopharyngeal Swab; Respiratory  Result Value Ref Range Status   Adenovirus NOT DETECTED NOT DETECTED Final   Coronavirus 229E NOT DETECTED NOT DETECTED Final    Comment: (NOTE) The Coronavirus on the Respiratory Panel, DOES NOT test for the novel  Coronavirus (2019 nCoV)    Coronavirus HKU1 NOT DETECTED NOT DETECTED Final   Coronavirus NL63 NOT DETECTED NOT DETECTED Final   Coronavirus OC43 NOT DETECTED NOT DETECTED Final   Metapneumovirus NOT DETECTED NOT DETECTED Final   Rhinovirus / Enterovirus NOT DETECTED NOT DETECTED Final   Influenza A NOT DETECTED NOT DETECTED Final   Influenza B NOT DETECTED NOT  DETECTED Final   Parainfluenza Virus 1 NOT DETECTED NOT DETECTED Final   Parainfluenza Virus 2 NOT DETECTED NOT DETECTED Final   Parainfluenza Virus 3 NOT DETECTED NOT DETECTED Final   Parainfluenza Virus 4 NOT DETECTED NOT DETECTED Final   Respiratory Syncytial Virus NOT DETECTED NOT DETECTED Final   Bordetella pertussis NOT DETECTED NOT DETECTED Final   Chlamydophila pneumoniae NOT DETECTED NOT DETECTED Final   Mycoplasma pneumoniae NOT DETECTED NOT DETECTED Final    Comment: Performed at [facility], [place], [place], [place] [address]  Culture, blood (routine x 2)     Status: None (Preliminary result)   Collection Time: 11/05/19 12:08 AM   Specimen: BLOOD  Result Value Ref Range Status   Specimen Description BLOOD RIGHT ANTECUBITAL  Final   Special Requests   Final    BOTTLES DRAWN AEROBIC AND ANAEROBIC Blood Culture results may not be optimal due to an excessive volume of blood received in culture bottles   Culture   Final    NO GROWTH 2 DAYS Performed at [facility], [address], [place], [place] [address]    Report Status PENDING  Incomplete  Culture, blood (routine x 2)     Status: None (Preliminary result)   Collection Time: 11/05/19 12:08 AM   Specimen: BLOOD  Result Value Ref Range Status   Specimen Description BLOOD BLOOD RIGHT WRIST  Final   Special Requests   Final    BOTTLES DRAWN AEROBIC AND ANAEROBIC Blood Culture adequate volume   Culture   Final    NO GROWTH 2 DAYS Performed at [facility], [address] [place], [place] [address]    Report Status PENDING  Incomplete  Urine Culture     Status: Abnormal   Collection Time: 11/05/19  9:34 AM   Specimen: Urine, Random  Result Value Ref Range Status   Specimen Description   Final    URINE, RANDOM Performed at [facility], [address] [place], [place] [address]    Special Requests   Final    NONE Performed at [facility], [address]  [place], [place] [address]    Culture MULTIPLE SPECIES PRESENT, SUGGEST RECOLLECTION (A)  Final   Report Status 11/06/2019 FINAL  Final         Radiology Studies: No results found.      Scheduled Meds: . amLODipine  5 mg Oral Daily  . vitamin C  500 mg Oral BID  . aspirin EC  81 mg Oral Daily  .  timolol  1 drop Both Eyes Daily   And  . brimonidine  1 drop Both Eyes Daily   And  . dorzolamide  1 drop Both Eyes Daily  . cholecalciferol  1,000 Units Oral Daily  . dexamethasone (DECADRON) injection  4 mg Intravenous Q12H  . enoxaparin (LOVENOX) injection  40 mg Subcutaneous Q24H  . zinc sulfate  220 mg Oral Daily   Continuous Infusions: . sodium chloride Stopped (11/06/19 2311)  . azithromycin Stopped (11/06/19 2241)  . cefTRIAXone (ROCEPHIN)  IV Stopped (11/06/19 2222)  . remdesivir 100 mg in NS 100 mL 100 mg (11/07/19 0954)     LOS: 2 days    Time spent: 30-35 minutes    Ezekiel Slocumb, DO [facility] Pager: [phone]  If 7PM-7AM, please contact night-coverage [email or web address] Password [email or web address] 11/07/2019, 12:19 PM

## 2019-11-08 DIAGNOSIS — E559 Vitamin D deficiency, unspecified: Secondary | ICD-10-CM

## 2019-11-08 LAB — VITAMIN D 25 HYDROXY (VIT D DEFICIENCY, FRACTURES): Vit D, 25-Hydroxy: 21.81 ng/mL — ABNORMAL LOW (ref 30–100)

## 2019-11-08 LAB — CBC
HCT: 37.1 % (ref 36.0–46.0)
Hemoglobin: 12.5 g/dL (ref 12.0–15.0)
MCH: 28.9 pg (ref 26.0–34.0)
MCHC: 33.7 g/dL (ref 30.0–36.0)
MCV: 85.9 fL (ref 80.0–100.0)
Platelets: 343 10*3/uL (ref 150–400)
RBC: 4.32 MIL/uL (ref 3.87–5.11)
RDW: 13.8 % (ref 11.5–15.5)
WBC: 7.3 10*3/uL (ref 4.0–10.5)
nRBC: 0 % (ref 0.0–0.2)

## 2019-11-08 LAB — COMPREHENSIVE METABOLIC PANEL
ALT: 16 U/L (ref 0–44)
AST: 17 U/L (ref 15–41)
Albumin: 2.8 g/dL — ABNORMAL LOW (ref 3.5–5.0)
Alkaline Phosphatase: 60 U/L (ref 38–126)
Anion gap: 9 (ref 5–15)
BUN: 18 mg/dL (ref 8–23)
CO2: 22 mmol/L (ref 22–32)
Calcium: 9 mg/dL (ref 8.9–10.3)
Chloride: 109 mmol/L (ref 98–111)
Creatinine, Ser: 0.61 mg/dL (ref 0.44–1.00)
GFR calc Af Amer: >60 mL/min (ref >60)
GFR calc non Af Amer: >60 mL/min (ref >60)
Glucose, Bld: 179 mg/dL — ABNORMAL HIGH (ref 70–99)
Potassium: 4.4 mmol/L (ref 3.5–5.1)
Sodium: 140 mmol/L (ref 135–145)
Total Bilirubin: 0.5 mg/dL (ref 0.3–1.2)
Total Protein: 6.4 g/dL — ABNORMAL LOW (ref 6.5–8.1)

## 2019-11-08 LAB — HIGH SENSITIVITY CRP: CRP, High Sensitivity: 61.38 mg/L — ABNORMAL HIGH (ref 0.00–3.00)

## 2019-11-08 LAB — FIBRIN DERIVATIVES D-DIMER (ARMC ONLY): Fibrin derivatives D-dimer (ARMC): 2715.93 ng/mL (FEU) — ABNORMAL HIGH (ref 0.00–499.00)

## 2019-11-08 MED ORDER — GUAIFENESIN 100 MG/5ML PO SOLN
5.0000 mL | ORAL | Status: DC | PRN
  Administered 2019-11-09: 100 mg via ORAL
  Filled 2019-11-08 (×4): qty 5

## 2019-11-08 MED ORDER — VITAMIN D 25 MCG (1000 UNIT) PO TABS
1000.0000 [IU] | ORAL_TABLET | Freq: Every day | ORAL | Status: DC
  Administered 2019-11-08 – 2019-11-09 (×2): 1000 [IU] via ORAL
  Filled 2019-11-08 (×2): qty 1

## 2019-11-08 MED ORDER — HYDROCOD POLST-CPM POLST ER 10-8 MG/5ML PO SUER
5.0000 mL | Freq: Two times a day (BID) | ORAL | Status: DC | PRN
  Filled 2019-11-08: qty 5

## 2019-11-08 NOTE — Progress Notes (Addendum)
PROGRESS NOTE    Brandy Ewing  [id number] DOB: 05/28/1951 DOA: 11/04/2019  PCP: [facility]    LOS - 3   Brief Narrative:  68 y.o.femalewith medical historysignificant for hypertension and glaucoma who presents to the emergency room with a 4-day history of fever and shortness of breath, with associated decreased appetite and generalized malaise. Reported having had a contact with COVID-19 positive patient.  She was found to be positive for COVID-19 with elevated inflammatory markers and bilateral opacities on chest x-ray. Respiratory viral panel negative.  Started on remdesivir and Decadron.    Subjective 12/17: Patient awake sitting edge of bed when seen this AM.  States she feels okay.  Does not feel short of breath when up to bathroom, but moving around does increase her coughing.  No fever or chills.  No acute events reported overnight.  Assessment & Plan:   Principal Problem:   Suspected COVID-19 virus infection Active Problems:   Essential hypertension   Hypoxemia   CAP (community acquired pneumonia)   Glaucoma   Vitamin D deficiency   COVID-19 pneumonia. Positive for COVID-19, and inflammatory markers are elevated with D-dimer above 4000, elevated fibrinogen and ferritin. CRP 168.  Procalcitonin is positive at 0.19.  Inflammatory markers have been improving.  Has not required supplemental oxygen to date. - Continueremdesivir - day 4 of 5 - Continue Decadron - day 4, will complete 5 days   - Continue azithromycin - Supplemental oxygen as needed to maintain O2 sat > 90% - Continuevitamin C, vitamin D and zinc   UTI.UA showed leukocytes, and patient reported mildly increased urinary urgency and frequency. Started on Rocephin empirically, pending culture results.  Urine culture grew multiple species, likely contaminated, recollection recommended but has now been on antibiotics, so unlikely to be helpful.   - antibiotics  completed  Hypokalemia.Resolved with repletion. -Replete electrolytesas neededand monitor.  Hypertension.Blood pressure mildly elevated. -Continue home amlodipine.  Glaucoma. -Continue home meds.  Vitamin D Deficiency - - Start 1000 units vitamin D3 daiyl   DVT prophylaxis: Lovenox   Code Status: Full Code  Family Communication: none at bedside  Disposition Plan:  Expect d/c home after day 5 of remdesevir on 12/18, pending clinical improvement   Consultants:   None  Procedures:   None  Antimicrobials:   Rocephin  Zithromax    Objective: Vitals:   11/07/19 1145 11/07/19 2032 11/08/19 0539 11/08/19 1148  BP: 115/88 115/68 137/69 117/63  Pulse: 76 60 (!) 59 (!) 58  Resp:  20 20   Temp: (!) 97.4 F (36.3 C) 98.1 F (36.7 C) 97.8 F (36.6 C) (!) 97.5 F (36.4 C)  TempSrc: Oral Oral Oral Oral  SpO2: 97% 98% 98% 98%  Weight:      Height:        Intake/Output Summary (Last 24 hours) at 11/08/2019 1408 Last data filed at 11/08/2019 1148 Gross per 24 hour  Intake 240 ml  Output 600 ml  Net -360 ml   Filed Weights   11/04/19 2055 11/05/19 2220  Weight: 68.9 kg 69.7 kg    Examination:  General exam: awake, alert, no acute distress HEENT: moist mucus membranes, hearing grossly normal  Respiratory system: clear but decreased breath sounds bilaterally, no wheezes, rales or rhonchi, normal respiratory effort. Cardiovascular system: normal S1/S2, RRR, no JVD, murmurs, rubs, gallops, no pedal edema.   Central nervous system: alert and oriented x4. no gross focal neurologic deficits, normal speech Extremities: moves all, no edema, normal  tone Skin: dry, intact, normal temperature Psychiatry: normal mood, congruent affect, judgement and insight appear normal    Data Reviewed: I have personally reviewed following labs and imaging studies  CBC: Recent Labs  Lab 11/04/19 2104 11/06/19 0553 11/07/19 0529 11/08/19 0400  WBC 13.4* 7.6 7.8  7.3  HGB 12.9 12.2 12.6 12.5  HCT 38.9 37.1 37.8 37.1  MCV 89.0 89.8 88.5 85.9  PLT 225 245 316 [id number]   Basic Metabolic Panel: Recent Labs  Lab 11/04/19 2104 11/05/19 0934 11/06/19 0553 11/07/19 0529 11/08/19 0400  NA 141  --  142 140 140  K 3.2*  --  4.1 4.1 4.4  CL 105  --  109 107 109  CO2 22  --  21* 21* 22  GLUCOSE 113*  --  116* 141* 179*  BUN 16  --  20 21 18   CREATININE 1.05*  --  0.61 0.73 0.61  CALCIUM 9.0  --  9.2 9.1 9.0  MG  --  2.2  --   --   --    GFR: Estimated Creatinine Clearance: 61.1 mL/min (by C-G formula based on SCr of 0.61 mg/dL). Liver Function Tests: Recent Labs  Lab 11/06/19 0553 11/07/19 0529 11/08/19 0400  AST 22 21 17   ALT 16 17 16   ALKPHOS 66 65 60  BILITOT 0.7 0.6 0.5  PROT 7.2 6.7 6.4*  ALBUMIN 2.9* 2.9* 2.8*   No results for input(s): LIPASE, AMYLASE in the last 168 hours. No results for input(s): AMMONIA in the last 168 hours. Coagulation Profile: No results for input(s): INR, PROTIME in the last 168 hours. Cardiac Enzymes: No results for input(s): CKTOTAL, CKMB, CKMBINDEX, TROPONINI in the last 168 hours. BNP (last 3 results) No results for input(s): PROBNP in the last 8760 hours. HbA1C: No results for input(s): HGBA1C in the last 72 hours. CBG: No results for input(s): GLUCAP in the last 168 hours. Lipid Profile: No results for input(s): CHOL, HDL, LDLCALC, TRIG, CHOLHDL, LDLDIRECT in the last 72 hours. Thyroid Function Tests: No results for input(s): TSH, T4TOTAL, FREET4, T3FREE, THYROIDAB in the last 72 hours. Anemia Panel: No results for input(s): VITAMINB12, FOLATE, FERRITIN, TIBC, IRON, RETICCTPCT in the last 72 hours. Sepsis Labs: Recent Labs  Lab 11/05/19 0007 11/05/19 0934 11/06/19 0553 11/07/19 0529  PROCALCITON  --  0.19 0.13 <0.10  LATICACIDVEN 0.9  --   --   --     Recent Results (from the past 240 hour(s))  SARS CORONAVIRUS 2 (TAT 6-24 HRS) Nasopharyngeal Nasopharyngeal Swab     Status: Abnormal    Collection Time: 11/05/19 12:07 AM   Specimen: Nasopharyngeal Swab  Result Value Ref Range Status   SARS Coronavirus 2 POSITIVE (A) NEGATIVE Final    Comment: RESULT CALLED TO, READ BACK BY AND VERIFIED WITH: Henderson Newcomer RN 12:40 11/05/19 ([email or web address]) (NOTE) SARS-CoV-2 target nucleic acids are DETECTED. The SARS-CoV-2 RNA is generally detectable in upper and lower respiratory specimens during the acute phase of infection. Positive results are indicative of the presence of SARS-CoV-2 RNA. Clinical correlation with patient history and other diagnostic information is  necessary to determine patient infection status. Positive results do not rule out bacterial infection or co-infection with other viruses.  The expected result is Negative. Fact Sheet for Patients: SugarRoll.be Fact Sheet for Healthcare Providers: https://www.woods-mathews.com/ This test is not yet approved or cleared by the [place] FDA and  has been authorized for detection and/or diagnosis of SARS-CoV-2 by FDA under an Emergency Use Authorization (EUA).  This EUA will remain  in effect (meaning this test can be used) for t he duration of the COVID-19 declaration under Section 564(b)(1) of the Act, 21 U.S.C. section 360bbb-3(b)(1), unless the authorization is terminated or revoked sooner. Performed at [facility], [place] [address] [place], [place] [address]   Respiratory Panel by PCR     Status: None   Collection Time: 11/05/19 12:07 AM   Specimen: Nasopharyngeal Swab; Respiratory  Result Value Ref Range Status   Adenovirus NOT DETECTED NOT DETECTED Final   Coronavirus 229E NOT DETECTED NOT DETECTED Final    Comment: (NOTE) The Coronavirus on the Respiratory Panel, DOES NOT test for the novel  Coronavirus (2019 nCoV)    Coronavirus HKU1 NOT DETECTED NOT DETECTED Final   Coronavirus NL63 NOT DETECTED NOT DETECTED Final   Coronavirus OC43 NOT DETECTED NOT DETECTED Final    Metapneumovirus NOT DETECTED NOT DETECTED Final   Rhinovirus / Enterovirus NOT DETECTED NOT DETECTED Final   Influenza A NOT DETECTED NOT DETECTED Final   Influenza B NOT DETECTED NOT DETECTED Final   Parainfluenza Virus 1 NOT DETECTED NOT DETECTED Final   Parainfluenza Virus 2 NOT DETECTED NOT DETECTED Final   Parainfluenza Virus 3 NOT DETECTED NOT DETECTED Final   Parainfluenza Virus 4 NOT DETECTED NOT DETECTED Final   Respiratory Syncytial Virus NOT DETECTED NOT DETECTED Final   Bordetella pertussis NOT DETECTED NOT DETECTED Final   Chlamydophila pneumoniae NOT DETECTED NOT DETECTED Final   Mycoplasma pneumoniae NOT DETECTED NOT DETECTED Final    Comment: Performed at [facility], [place]. [address], [place], [place] [address]  Culture, blood (routine x 2)     Status: None (Preliminary result)   Collection Time: 11/05/19 12:08 AM   Specimen: BLOOD  Result Value Ref Range Status   Specimen Description BLOOD RIGHT ANTECUBITAL  Final   Special Requests   Final    BOTTLES DRAWN AEROBIC AND ANAEROBIC Blood Culture results may not be optimal due to an excessive volume of blood received in culture bottles   Culture   Final    NO GROWTH 3 DAYS Performed at [facility], [address] [place], [place] [address]    Report Status PENDING  Incomplete  Culture, blood (routine x 2)     Status: None (Preliminary result)   Collection Time: 11/05/19 12:08 AM   Specimen: BLOOD  Result Value Ref Range Status   Specimen Description BLOOD BLOOD RIGHT WRIST  Final   Special Requests   Final    BOTTLES DRAWN AEROBIC AND ANAEROBIC Blood Culture adequate volume   Culture   Final    NO GROWTH 3 DAYS Performed at [facility], [address], [place], [place] [address]    Report Status PENDING  Incomplete  Urine Culture     Status: Abnormal   Collection Time: 11/05/19  9:34 AM   Specimen: Urine, Random  Result Value Ref Range Status   Specimen Description   Final     URINE, RANDOM Performed at [facility], [address] [place], [place] [address]    Special Requests   Final    NONE Performed at [facility], [address], [place], [place] [address]    Culture MULTIPLE SPECIES PRESENT, SUGGEST RECOLLECTION (A)  Final   Report Status 11/06/2019 FINAL  Final         Radiology Studies: No results found.      Scheduled Meds: . amLODipine  5  mg Oral Daily  . vitamin C  500 mg Oral BID  . aspirin EC  81 mg Oral Daily  . timolol  1 drop Both Eyes Daily   And  . brimonidine  1 drop Both Eyes Daily   And  . dorzolamide  1 drop Both Eyes Daily  . cholecalciferol  1,000 Units Oral Daily  . dexamethasone (DECADRON) injection  4 mg Intravenous Q12H  . enoxaparin (LOVENOX) injection  40 mg Subcutaneous Q24H  . cholecalciferol  1,000 Units Oral Daily  . zinc sulfate  220 mg Oral Daily   Continuous Infusions: . sodium chloride Stopped (11/06/19 2311)  . azithromycin 500 mg (11/07/19 2214)  . cefTRIAXone (ROCEPHIN)  IV 2 g (11/07/19 2215)  . remdesivir 100 mg in NS 100 mL 100 mg (11/08/19 1023)     LOS: 3 days    Time spent: 30-35 minutes    Ezekiel Slocumb, DO [facility] Pager: [phone]  If 7PM-7AM, please contact night-coverage [email or web address] Password [email or web address] 11/08/2019, 2:08 PM

## 2019-11-09 DIAGNOSIS — E559 Vitamin D deficiency, unspecified: Secondary | ICD-10-CM

## 2019-11-09 DIAGNOSIS — U071 COVID-19: Secondary | ICD-10-CM

## 2019-11-09 LAB — COMPREHENSIVE METABOLIC PANEL
ALT: 17 U/L (ref 0–44)
AST: 14 U/L — ABNORMAL LOW (ref 15–41)
Albumin: 2.9 g/dL — ABNORMAL LOW (ref 3.5–5.0)
Alkaline Phosphatase: 56 U/L (ref 38–126)
Anion gap: 10 (ref 5–15)
BUN: 15 mg/dL (ref 8–23)
CO2: 23 mmol/L (ref 22–32)
Calcium: 8.7 mg/dL — ABNORMAL LOW (ref 8.9–10.3)
Chloride: 107 mmol/L (ref 98–111)
Creatinine, Ser: 0.65 mg/dL (ref 0.44–1.00)
GFR calc Af Amer: >60 mL/min (ref >60)
GFR calc non Af Amer: >60 mL/min (ref >60)
Glucose, Bld: 145 mg/dL — ABNORMAL HIGH (ref 70–99)
Potassium: 4.2 mmol/L (ref 3.5–5.1)
Sodium: 140 mmol/L (ref 135–145)
Total Bilirubin: 0.6 mg/dL (ref 0.3–1.2)
Total Protein: 6.4 g/dL — ABNORMAL LOW (ref 6.5–8.1)

## 2019-11-09 LAB — HIGH SENSITIVITY CRP: CRP, High Sensitivity: 17.72 mg/L — ABNORMAL HIGH (ref 0.00–3.00)

## 2019-11-09 LAB — C-REACTIVE PROTEIN: CRP: 1.1 mg/dL — ABNORMAL HIGH (ref <1.0)

## 2019-11-09 LAB — FIBRIN DERIVATIVES D-DIMER (ARMC ONLY): Fibrin derivatives D-dimer (ARMC): 2957.23 ng/mL (FEU) — ABNORMAL HIGH (ref 0.00–499.00)

## 2019-11-09 MED ORDER — BENZONATATE 100 MG PO CAPS: 100.0000 mg | ORAL_CAPSULE | Freq: Four times a day (QID) | ORAL | 0 refills | Status: AC | PRN

## 2019-11-09 MED ORDER — HYDROCOD POLST-CPM POLST ER 10-8 MG/5ML PO SUER: 5.0000 mL | Freq: Two times a day (BID) | ORAL | 0 refills | Status: AC | PRN

## 2019-11-09 MED ORDER — VITAMIN D3 25 MCG PO TABS: 1000.0000 [IU] | ORAL_TABLET | Freq: Every day | ORAL | 1 refills | Status: AC

## 2019-11-09 NOTE — Discharge Summary (Addendum)
Physician Discharge Summary  Brandy Ewing [id number] DOB: [birth year] DOA: 11/04/2019  PCP: [facility]  Admit date: 11/04/2019 Discharge date: 11/09/2019  Admitted From: Home Disposition: Home  Recommendations for Outpatient Follow-up:  1. Follow up with PCP in 1-2 weeks 2. Please obtain BMP/CBC in one week   Home Health: No Equipment/Devices: None  Discharge Condition: Stable CODE STATUS: Full Diet recommendation: Heart healthy  Brief/Interim Summary:  68 y.o.femalewith medical historysignificant for hypertension and glaucoma who presented to the emergency room with a 4-day history of fever and shortness of breath, with associateddecreased appetite and generalized malaise.Reported having hada contact with COVID-19 positivepatient. She was found to be positive for COVID-19 with elevated inflammatory markers and bilateral opacities on chest x-ray. Respiratory viral panel negative.  Started on remdesivir and Decadron.She did not require supplemental oxygen during admission.  She completed 5 days of therapy, and is clinically improved and stable for discharge home today.  Prescriptions for cough medications sent to her pharmacy.   Discharge Diagnoses: Principal Problem:   COVID-19 virus infection Active Problems:   Essential hypertension   Hypoxemia   CAP (community acquired pneumonia)   Glaucoma   Vitamin D deficiency  COVID-19 pneumonia. Positive for COVID-19, and inflammatory markers are elevated with D-dimer above 4000, elevated fibrinogen and ferritin. CRP 168. Procalcitonin is positive at 0.19. Inflammatory markers have been improving. Has not required supplemental oxygen to date.  Completed 5 days remdesivir and Decadron, vitamin C, and zinc.  Started vitamin D as well.  UTI.UA showed leukocytes, and patient reportedmildly increased urinary urgency and frequency.Started on Rocephin empirically, pending culture results.  Urine  culture grew multiple species, likely contaminated, recollection recommended but has now been on antibiotics, so unlikely to be helpful.  - course of Rocephin completed  Hypokalemia.Resolved with repletion. -Replete electrolytesas neededand monitor.  Hypertension.Blood pressure mildly elevated. -Continue home amlodipine.  Glaucoma. -Continue home meds.  Vitamin D Deficiency - - contniue 1000 units vitamin D3 daily   Discharge Instructions  Discharge Instructions    Call MD for:   Complete by: As directed    Worsening shortness of breath   Call MD for:  extreme fatigue   Complete by: As directed    Call MD for:  temperature >100.4   Complete by: As directed    Diet - low sodium heart healthy   Complete by: As directed    Increase activity slowly   Complete by: As directed      Allergies as of 11/09/2019   No Known Allergies     Medication List    TAKE these medications   amLODipine 5 MG tablet Commonly known as: NORVASC Take 5 mg by mouth daily.   aspirin EC 81 MG tablet Take 81 mg by mouth daily.   benzonatate 100 MG capsule Commonly known as: Tessalon Perles Take 1 capsule (100 mg total) by mouth every 6 (six) hours as needed for cough.   chlorpheniramine-HYDROcodone 10-8 MG/5ML Suer Commonly known as: TUSSIONEX Take 5 mLs by mouth every 12 (twelve) hours as needed for cough.   NONFORMULARY OR COMPOUNDED ITEM Place 1 drop into both eyes daily. TIMOLOL 0.5% / BRIMONIDINE 0.2% / DORZOLAMIDE 2%   NONFORMULARY OR COMPOUNDED ITEM Place 1 drop into both eyes at bedtime. TIMOLOL 0.5% / BRIMONIDINE 0.2% / DORZOLAMIDE 2% / LATANOPROST 0.005%       No Known Allergies  Consultations:  None   Procedures/Studies: DG Chest Portable 1 View  Result Date: 11/04/2019 CLINICAL DATA:  Shortness of breath, possible COVID-19 exposure EXAM: PORTABLE CHEST 1 VIEW COMPARISON:  03/21/2011 FINDINGS: Cardiac shadows within normal limits but accentuated by the  portable technique. Lungs are well aerated bilaterally. Patchy airspace opacities are noted consistent with atypical/viral pneumonia. No sizable effusion is noted. No bony abnormality is noted. IMPRESSION: Patchy airspace opacities which could represent viral pneumonia. Electronically Signed   By: Inez Catalina M.D.   On: 11/04/2019 21:28       Subjective: Patient seen awake sitting up in bed this morning.  She reports feeling slightly weak with ambulation, but feels she will do okay at home denies fevers or chills,, chest pain or shortness of breath no acute events reported overnight.   Discharge Exam: Vitals:   11/09/19 0638 11/09/19 1225  BP: 113/66 118/68  Pulse: 66 74  Resp: 18 16  Temp: 97.7 F (36.5 C) 97.9 F (36.6 C)  SpO2: 97% 99%   Vitals:   11/08/19 1148 11/08/19 2055 11/09/19 0638 11/09/19 1225  BP: 117/63 123/72 113/66 118/68  Pulse: (!) 58 (!) 59 66 74  Resp:  16 18 16   Temp: (!) 97.5 F (36.4 C) 97.7 F (36.5 C) 97.7 F (36.5 C) 97.9 F (36.6 C)  TempSrc: Oral Oral Oral Oral  SpO2: 98% 100% 97% 99%  Weight:      Height:        General: Pt is alert, awake, not in acute distress Cardiovascular: RRR, S1/S2 +, no rubs, no gallops Respiratory: CTA bilaterally, no wheezing, no rhonchi Abdominal: Soft, NT, ND, bowel sounds + Extremities: no edema, no cyanosis    The results of significant diagnostics from this hospitalization (including imaging, microbiology, ancillary and laboratory) are listed below for reference.     Microbiology: Recent Results (from the past 240 hour(s))  SARS CORONAVIRUS 2 (TAT 6-24 HRS) Nasopharyngeal Nasopharyngeal Swab     Status: Abnormal   Collection Time: 11/05/19 12:07 AM   Specimen: Nasopharyngeal Swab  Result Value Ref Range Status   SARS Coronavirus 2 POSITIVE (A) NEGATIVE Final    Comment: RESULT CALLED TO, READ BACK BY AND VERIFIED WITH: Henderson Newcomer RN 12:40 11/05/19 ([email or web address]) (NOTE) SARS-CoV-2 target nucleic acids are  DETECTED. The SARS-CoV-2 RNA is generally detectable in upper and lower respiratory specimens during the acute phase of infection. Positive results are indicative of the presence of SARS-CoV-2 RNA. Clinical correlation with patient history and other diagnostic information is  necessary to determine patient infection status. Positive results do not rule out bacterial infection or co-infection with other viruses.  The expected result is Negative. Fact Sheet for Patients: SugarRoll.be Fact Sheet for Healthcare Providers: https://www.woods-mathews.com/ This test is not yet approved or cleared by the [place] FDA and  has been authorized for detection and/or diagnosis of SARS-CoV-2 by FDA under an Emergency Use Authorization (EUA). This EUA will remain  in effect (meaning this test can be used) for t he duration of the COVID-19 declaration under Section 564(b)(1) of the Act, 21 U.S.C. section 360bbb-3(b)(1), unless the authorization is terminated or revoked sooner. Performed at [facility], [place] [address] [place], [place] [address]   Respiratory Panel by PCR     Status: None   Collection Time: 11/05/19 12:07 AM   Specimen: Nasopharyngeal Swab; Respiratory  Result Value Ref Range Status   Adenovirus NOT DETECTED NOT DETECTED Final   Coronavirus 229E NOT DETECTED NOT DETECTED Final    Comment: (NOTE) The Coronavirus on the Respiratory Panel, DOES NOT test for the  novel  Coronavirus (2019 nCoV)    Coronavirus HKU1 NOT DETECTED NOT DETECTED Final   Coronavirus NL63 NOT DETECTED NOT DETECTED Final   Coronavirus OC43 NOT DETECTED NOT DETECTED Final   Metapneumovirus NOT DETECTED NOT DETECTED Final   Rhinovirus / Enterovirus NOT DETECTED NOT DETECTED Final   Influenza A NOT DETECTED NOT DETECTED Final   Influenza B NOT DETECTED NOT DETECTED Final   Parainfluenza Virus 1 NOT DETECTED NOT DETECTED Final   Parainfluenza Virus 2 NOT  DETECTED NOT DETECTED Final   Parainfluenza Virus 3 NOT DETECTED NOT DETECTED Final   Parainfluenza Virus 4 NOT DETECTED NOT DETECTED Final   Respiratory Syncytial Virus NOT DETECTED NOT DETECTED Final   Bordetella pertussis NOT DETECTED NOT DETECTED Final   Chlamydophila pneumoniae NOT DETECTED NOT DETECTED Final   Mycoplasma pneumoniae NOT DETECTED NOT DETECTED Final    Comment: Performed at [facility], [place] [address], [place], [place] [address]  Culture, blood (routine x 2)     Status: None (Preliminary result)   Collection Time: 11/05/19 12:08 AM   Specimen: BLOOD  Result Value Ref Range Status   Specimen Description BLOOD RIGHT ANTECUBITAL  Final   Special Requests   Final    BOTTLES DRAWN AEROBIC AND ANAEROBIC Blood Culture results may not be optimal due to an excessive volume of blood received in culture bottles   Culture   Final    NO GROWTH 4 DAYS Performed at [facility], [address], [place], [place] [address]    Report Status PENDING  Incomplete  Culture, blood (routine x 2)     Status: None (Preliminary result)   Collection Time: 11/05/19 12:08 AM   Specimen: BLOOD  Result Value Ref Range Status   Specimen Description BLOOD BLOOD RIGHT WRIST  Final   Special Requests   Final    BOTTLES DRAWN AEROBIC AND ANAEROBIC Blood Culture adequate volume   Culture   Final    NO GROWTH 4 DAYS Performed at [facility], [address], [place], [place] [address]    Report Status PENDING  Incomplete  Urine Culture     Status: Abnormal   Collection Time: 11/05/19  9:34 AM   Specimen: Urine, Random  Result Value Ref Range Status   Specimen Description   Final    URINE, RANDOM Performed at [facility], [address], [place], [place] [address]    Special Requests   Final    NONE Performed at [facility], [address] [place], [place] [address]    Culture MULTIPLE SPECIES PRESENT, SUGGEST RECOLLECTION (A)  Final   Report  Status 11/06/2019 FINAL  Final     Labs: BNP (last 3 results) No results for input(s): BNP in the last 8760 hours. Basic Metabolic Panel: Recent Labs  Lab 11/04/19 2104 11/05/19 0934 11/06/19 0553 11/07/19 0529 11/08/19 0400 11/09/19 0608  NA 141  --  142 140 140 140  K 3.2*  --  4.1 4.1 4.4 4.2  CL 105  --  109 107 109 107  CO2 22  --  21* 21* 22 23  GLUCOSE 113*  --  116* 141* 179* 145*  BUN 16  --  20 21 18 15   CREATININE 1.05*  --  0.61 0.73 0.61 0.65  CALCIUM 9.0  --  9.2 9.1 9.0 8.7*  MG  --  2.2  --   --   --   --    Liver Function  Tests: Recent Labs  Lab 11/06/19 0553 11/07/19 0529 11/08/19 0400 11/09/19 0608  AST 22 21 17  14*  ALT 16 17 16 17   ALKPHOS 66 65 60 56  BILITOT 0.7 0.6 0.5 0.6  PROT 7.2 6.7 6.4* 6.4*  ALBUMIN 2.9* 2.9* 2.8* 2.9*   No results for input(s): LIPASE, AMYLASE in the last 168 hours. No results for input(s): AMMONIA in the last 168 hours. CBC: Recent Labs  Lab 11/04/19 2104 11/06/19 0553 11/07/19 0529 11/08/19 0400  WBC 13.4* 7.6 7.8 7.3  HGB 12.9 12.2 12.6 12.5  HCT 38.9 37.1 37.8 37.1  MCV 89.0 89.8 88.5 85.9  PLT 225 245 316 343   Cardiac Enzymes: No results for input(s): CKTOTAL, CKMB, CKMBINDEX, TROPONINI in the last 168 hours. BNP: Invalid input(s): POCBNP CBG: No results for input(s): GLUCAP in the last 168 hours. D-Dimer No results for input(s): DDIMER in the last 72 hours. Hgb A1c No results for input(s): HGBA1C in the last 72 hours. Lipid Profile No results for input(s): CHOL, HDL, LDLCALC, TRIG, CHOLHDL, LDLDIRECT in the last 72 hours. Thyroid function studies No results for input(s): TSH, T4TOTAL, T3FREE, THYROIDAB in the last 72 hours.  Invalid input(s): FREET3 Anemia work up No results for input(s): VITAMINB12, FOLATE, FERRITIN, TIBC, IRON, RETICCTPCT in the last 72 hours. Urinalysis    Component Value Date/Time   COLORURINE YELLOW (A) 11/05/2019 0934   APPEARANCEUR HAZY (A) 11/05/2019 0934    LABSPEC 1.021 11/05/2019 0934   PHURINE 6.0 11/05/2019 0934   GLUCOSEU NEGATIVE 11/05/2019 0934   HGBUR SMALL (A) 11/05/2019 0934   BILIRUBINUR NEGATIVE 11/05/2019 0934   KETONESUR 20 (A) 11/05/2019 0934   PROTEINUR 100 (A) 11/05/2019 0934   NITRITE NEGATIVE 11/05/2019 0934   LEUKOCYTESUR SMALL (A) 11/05/2019 0934   Sepsis Labs Invalid input(s): PROCALCITONIN,  WBC,  LACTICIDVEN Microbiology Recent Results (from the past 240 hour(s))  SARS CORONAVIRUS 2 (TAT 6-24 HRS) Nasopharyngeal Nasopharyngeal Swab     Status: Abnormal   Collection Time: 11/05/19 12:07 AM   Specimen: Nasopharyngeal Swab  Result Value Ref Range Status   SARS Coronavirus 2 POSITIVE (A) NEGATIVE Final    Comment: RESULT CALLED TO, READ BACK BY AND VERIFIED WITH: Henderson Newcomer RN 12:40 11/05/19 ([email or web address]) (NOTE) SARS-CoV-2 target nucleic acids are DETECTED. The SARS-CoV-2 RNA is generally detectable in upper and lower respiratory specimens during the acute phase of infection. Positive results are indicative of the presence of SARS-CoV-2 RNA. Clinical correlation with patient history and other diagnostic information is  necessary to determine patient infection status. Positive results do not rule out bacterial infection or co-infection with other viruses.  The expected result is Negative. Fact Sheet for Patients: SugarRoll.be Fact Sheet for Healthcare Providers: https://www.woods-mathews.com/ This test is not yet approved or cleared by the [place] FDA and  has been authorized for detection and/or diagnosis of SARS-CoV-2 by FDA under an Emergency Use Authorization (EUA). This EUA will remain  in effect (meaning this test can be used) for t he duration of the COVID-19 declaration under Section 564(b)(1) of the Act, 21 U.S.C. section 360bbb-3(b)(1), unless the authorization is terminated or revoked sooner. Performed at [facility], [place] [address] [place],  [place] [address]   Respiratory Panel by PCR     Status: None   Collection Time: 11/05/19 12:07 AM   Specimen: Nasopharyngeal Swab; Respiratory  Result Value Ref Range Status   Adenovirus NOT DETECTED NOT DETECTED Final   Coronavirus 229E NOT DETECTED NOT DETECTED  Final    Comment: (NOTE) The Coronavirus on the Respiratory Panel, DOES NOT test for the novel  Coronavirus (2019 nCoV)    Coronavirus HKU1 NOT DETECTED NOT DETECTED Final   Coronavirus NL63 NOT DETECTED NOT DETECTED Final   Coronavirus OC43 NOT DETECTED NOT DETECTED Final   Metapneumovirus NOT DETECTED NOT DETECTED Final   Rhinovirus / Enterovirus NOT DETECTED NOT DETECTED Final   Influenza A NOT DETECTED NOT DETECTED Final   Influenza B NOT DETECTED NOT DETECTED Final   Parainfluenza Virus 1 NOT DETECTED NOT DETECTED Final   Parainfluenza Virus 2 NOT DETECTED NOT DETECTED Final   Parainfluenza Virus 3 NOT DETECTED NOT DETECTED Final   Parainfluenza Virus 4 NOT DETECTED NOT DETECTED Final   Respiratory Syncytial Virus NOT DETECTED NOT DETECTED Final   Bordetella pertussis NOT DETECTED NOT DETECTED Final   Chlamydophila pneumoniae NOT DETECTED NOT DETECTED Final   Mycoplasma pneumoniae NOT DETECTED NOT DETECTED Final    Comment: Performed at [facility], [place] [address], [place], [place] [address]  Culture, blood (routine x 2)     Status: None (Preliminary result)   Collection Time: 11/05/19 12:08 AM   Specimen: BLOOD  Result Value Ref Range Status   Specimen Description BLOOD RIGHT ANTECUBITAL  Final   Special Requests   Final    BOTTLES DRAWN AEROBIC AND ANAEROBIC Blood Culture results may not be optimal due to an excessive volume of blood received in culture bottles   Culture   Final    NO GROWTH 4 DAYS Performed at [facility], [address], [place], [place] [address]    Report Status PENDING  Incomplete  Culture, blood (routine x 2)     Status: None (Preliminary result)   Collection Time: 11/05/19  12:08 AM   Specimen: BLOOD  Result Value Ref Range Status   Specimen Description BLOOD BLOOD RIGHT WRIST  Final   Special Requests   Final    BOTTLES DRAWN AEROBIC AND ANAEROBIC Blood Culture adequate volume   Culture   Final    NO GROWTH 4 DAYS Performed at [facility], [address], [place], [place] [address]    Report Status PENDING  Incomplete  Urine Culture     Status: Abnormal   Collection Time: 11/05/19  9:34 AM   Specimen: Urine, Random  Result Value Ref Range Status   Specimen Description   Final    URINE, RANDOM Performed at [facility], [address], [place], [place] [address]    Special Requests   Final    NONE Performed at [facility], [address] [place],  [address]    Culture MULTIPLE SPECIES PRESENT, SUGGEST RECOLLECTION (A)  Final   Report Status 11/06/2019 FINAL  Final     Time coordinating discharge: Over 30 minutes  SIGNED:   Ezekiel Slocumb, DO [facility] 11/09/2019, 4:13 PM Pager [phone]  If 7PM-7AM, please contact night-coverage [email or web address] Password [email or web address]

## 2019-11-09 NOTE — Progress Notes (Signed)
[name] to be D/C'd Home per MD order.  Discussed prescriptions and follow up appointments with the patient. Prescriptions given to patient, medication list explained in detail. Pt verbalized understanding.  Allergies as of 11/09/2019   No Known Allergies     Medication List    TAKE these medications   amLODipine 5 MG tablet Commonly known as: NORVASC Take 5 mg by mouth daily.   aspirin EC 81 MG tablet Take 81 mg by mouth daily.   benzonatate 100 MG capsule Commonly known as: Tessalon Perles Take 1 capsule (100 mg total) by mouth every 6 (six) hours as needed for cough.   chlorpheniramine-HYDROcodone 10-8 MG/5ML Suer Commonly known as: TUSSIONEX Take 5 mLs by mouth every 12 (twelve) hours as needed for cough.   NONFORMULARY OR COMPOUNDED ITEM Place 1 drop into both eyes daily. TIMOLOL 0.5% / BRIMONIDINE 0.2% / DORZOLAMIDE 2%   NONFORMULARY OR COMPOUNDED ITEM Place 1 drop into both eyes at bedtime. TIMOLOL 0.5% / BRIMONIDINE 0.2% / DORZOLAMIDE 2% / LATANOPROST 0.005%   Vitamin D3 25 MCG tablet Commonly known as: Vitamin D Take 1 tablet (1,000 Units total) by mouth daily. Start taking on: November 10, 2019       Vitals:   11/09/19 [id number] 11/09/19 1225  BP: 113/66 118/68  Pulse: 66 74  Resp: 18 16  Temp: 97.7 F (36.5 C) 97.9 F (36.6 C)  SpO2: 97% 99%    Skin clean, dry and intact without evidence of skin break down, no evidence of skin tears noted. IV catheter discontinued intact. Site without signs and symptoms of complications. Dressing and pressure applied. Pt denies pain at this time. No complaints noted.  An After Visit Summary was printed and given to the patient. Patient escorted via [place], and D/C home via private auto.  Fuller Mandril, RN

## 2019-11-10 LAB — CULTURE, BLOOD (ROUTINE X 2)
Culture: NO GROWTH
Culture: NO GROWTH
Special Requests: ADEQUATE

## 2020-04-22 ENCOUNTER — Other Ambulatory Visit: Payer: Self-pay | Admitting: Family Medicine

## 2020-04-22 DIAGNOSIS — Z780 Asymptomatic menopausal state: Secondary | ICD-10-CM

## 2020-06-24 ENCOUNTER — Ambulatory Visit
Admission: RE | Admit: 2020-06-24 | Discharge: 2020-06-24 | Disposition: A | Discharge status: Home / Self Care | Payer: BC Managed Care – PPO | Source: Ambulatory Visit | Attending: Family Medicine | Admitting: Family Medicine

## 2020-06-24 ENCOUNTER — Other Ambulatory Visit: Payer: Self-pay

## 2020-06-24 DIAGNOSIS — Z780 Asymptomatic menopausal state: Secondary | ICD-10-CM | POA: Diagnosis not present

## 2020-07-01 ENCOUNTER — Inpatient Hospital Stay
Admission: RE | Admit: 2020-07-01 | Discharge: 2020-07-01 | Disposition: A | Discharge status: Home / Self Care | Payer: Self-pay | Source: Ambulatory Visit | Attending: *Deleted | Admitting: *Deleted

## 2020-07-01 ENCOUNTER — Other Ambulatory Visit: Payer: Self-pay | Admitting: *Deleted

## 2020-07-01 DIAGNOSIS — Z1231 Encounter for screening mammogram for malignant neoplasm of breast: Secondary | ICD-10-CM

## 2021-06-13 ENCOUNTER — Emergency Department: Payer: BC Managed Care – PPO

## 2021-06-13 ENCOUNTER — Encounter: Payer: Self-pay | Admitting: Emergency Medicine

## 2021-06-13 ENCOUNTER — Other Ambulatory Visit: Payer: Self-pay

## 2021-06-13 DIAGNOSIS — R0789 Other chest pain: Secondary | ICD-10-CM | POA: Diagnosis not present

## 2021-06-13 DIAGNOSIS — I10 Essential (primary) hypertension: Secondary | ICD-10-CM | POA: Diagnosis not present

## 2021-06-13 DIAGNOSIS — M542 Cervicalgia: Secondary | ICD-10-CM | POA: Diagnosis not present

## 2021-06-13 DIAGNOSIS — Z8616 Personal history of COVID-19: Secondary | ICD-10-CM | POA: Diagnosis not present

## 2021-06-13 DIAGNOSIS — Z79899 Other long term (current) drug therapy: Secondary | ICD-10-CM | POA: Insufficient documentation

## 2021-06-13 DIAGNOSIS — Y92410 Unspecified street and highway as the place of occurrence of the external cause: Secondary | ICD-10-CM | POA: Diagnosis not present

## 2021-06-13 DIAGNOSIS — M25511 Pain in right shoulder: Secondary | ICD-10-CM | POA: Insufficient documentation

## 2021-06-13 DIAGNOSIS — Z7982 Long term (current) use of aspirin: Secondary | ICD-10-CM | POA: Insufficient documentation

## 2021-06-13 DIAGNOSIS — R911 Solitary pulmonary nodule: Secondary | ICD-10-CM | POA: Diagnosis not present
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
## 2021-06-14 ENCOUNTER — Emergency Department: Payer: BC Managed Care – PPO

## 2021-06-14 ENCOUNTER — Emergency Department
Admission: EM | Admit: 2021-06-14 | Discharge: 2021-06-14 | Disposition: A | Discharge status: Home / Self Care | Payer: BC Managed Care – PPO | Attending: Emergency Medicine | Admitting: Emergency Medicine

## 2021-06-14 ENCOUNTER — Encounter: Payer: Self-pay | Admitting: Radiology

## 2021-06-14 DIAGNOSIS — R918 Other nonspecific abnormal finding of lung field: Secondary | ICD-10-CM

## 2021-06-14 HISTORY — DX: Essential (primary) hypertension: I10

## 2021-06-14 NOTE — ED Notes (Signed)
Pt taken to Xray at this time.
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
## 2021-06-18 ENCOUNTER — Other Ambulatory Visit (HOSPITAL_COMMUNITY): Payer: Self-pay | Admitting: Family Medicine

## 2021-06-18 ENCOUNTER — Other Ambulatory Visit: Payer: Self-pay | Admitting: Family Medicine

## 2021-06-18 DIAGNOSIS — R9389 Abnormal findings on diagnostic imaging of other specified body structures: Secondary | ICD-10-CM

## 2021-06-18 DIAGNOSIS — R911 Solitary pulmonary nodule: Secondary | ICD-10-CM

## 2021-09-09 ENCOUNTER — Other Ambulatory Visit: Payer: Self-pay | Admitting: Family Medicine

## 2021-09-09 DIAGNOSIS — Z1231 Encounter for screening mammogram for malignant neoplasm of breast: Secondary | ICD-10-CM

## 2021-09-11 ENCOUNTER — Encounter: Payer: Self-pay | Admitting: *Deleted

## 2021-09-11 NOTE — Progress Notes (Signed)
Previous referral for lung nodule clinic from ED received in July 2022. Per CareEverywhere, pt's PCP has placed order for follow up CT scan. Pt does not need any additional appts in the lung nodule clinic since PCP is managing order for follow up CT scan.

## 2021-09-18 ENCOUNTER — Other Ambulatory Visit: Payer: Self-pay

## 2021-09-18 ENCOUNTER — Other Ambulatory Visit: Payer: Self-pay | Admitting: Family Medicine

## 2021-09-18 ENCOUNTER — Ambulatory Visit
Admission: RE | Admit: 2021-09-18 | Discharge: 2021-09-18 | Disposition: A | Discharge status: Home / Self Care | Payer: BC Managed Care – PPO | Source: Ambulatory Visit | Attending: Family Medicine | Admitting: Family Medicine

## 2021-09-18 ENCOUNTER — Ambulatory Visit
Admission: RE | Admit: 2021-09-18 | Discharge: 2021-09-18 | Disposition: A | Discharge status: Home / Self Care | Payer: BC Managed Care – PPO | Attending: Family Medicine | Admitting: Family Medicine

## 2021-09-18 DIAGNOSIS — M25552 Pain in left hip: Secondary | ICD-10-CM | POA: Insufficient documentation

## 2021-10-25 ENCOUNTER — Encounter
Admit: 2021-10-25 | Discharge: 2021-10-28 | Disposition: A | Discharge status: Home / Self Care | Payer: PRIVATE HEALTH INSURANCE | Attending: Certified Registered" | Admitting: Internal Medicine

## 2021-10-25 ENCOUNTER — Ambulatory Visit
Admit: 2021-10-25 | Discharge: 2021-10-28 | Disposition: A | Discharge status: Home Health | Payer: PRIVATE HEALTH INSURANCE | Admitting: Internal Medicine

## 2021-10-25 ENCOUNTER — Ambulatory Visit
Admit: 2021-10-25 | Discharge: 2021-10-28 | Disposition: A | Discharge status: Home / Self Care | Payer: PRIVATE HEALTH INSURANCE | Admitting: Internal Medicine

## 2021-10-25 ENCOUNTER — Ambulatory Visit: Admit: 2021-10-25 | Discharge: 2021-10-28 | Payer: PRIVATE HEALTH INSURANCE

## 2021-10-26 DIAGNOSIS — R1900 Intra-abdominal and pelvic swelling, mass and lump, unspecified site: Principal | ICD-10-CM

## 2021-10-28 MED ORDER — ONDANSETRON 4 MG DISINTEGRATING TABLET
ORAL_TABLET | Freq: Three times a day (TID) | ORAL | 0 refills | 7.00000 days | Status: CP | PRN
Start: 2021-10-28 — End: 2021-11-18
  Filled 2021-10-28: qty 18, 21d supply, fill #0

## 2021-10-28 MED ORDER — CEPHALEXIN 500 MG CAPSULE
ORAL_CAPSULE | Freq: Three times a day (TID) | ORAL | 0 refills | 2 days | Status: CP
Start: 2021-10-28 — End: 2021-10-30
  Filled 2021-10-28: qty 6, 2d supply, fill #0

## 2021-11-04 ENCOUNTER — Ambulatory Visit: Admit: 2021-11-04 | Discharge: 2021-11-05 | Payer: PRIVATE HEALTH INSURANCE

## 2021-11-04 DIAGNOSIS — R1900 Intra-abdominal and pelvic swelling, mass and lump, unspecified site: Principal | ICD-10-CM

## 2021-11-04 DIAGNOSIS — C801 Malignant (primary) neoplasm, unspecified: Principal | ICD-10-CM

## 2021-11-04 MED ORDER — OXYCODONE 5 MG TABLET
ORAL_TABLET | ORAL | 0 refills | 5 days | Status: CP | PRN
Start: 2021-11-04 — End: ?

## 2021-11-04 MED ORDER — BENZONATATE 200 MG CAPSULE
ORAL_CAPSULE | Freq: Three times a day (TID) | ORAL | 3 refills | 30 days | Status: CP | PRN
Start: 2021-11-04 — End: 2021-12-04

## 2021-11-12 ENCOUNTER — Ambulatory Visit: Admit: 2021-11-12 | Discharge: 2021-11-12 | Payer: PRIVATE HEALTH INSURANCE

## 2021-11-12 DIAGNOSIS — R1900 Intra-abdominal and pelvic swelling, mass and lump, unspecified site: Principal | ICD-10-CM

## 2021-11-12 DIAGNOSIS — C801 Malignant (primary) neoplasm, unspecified: Principal | ICD-10-CM

## 2021-11-17 ENCOUNTER — Other Ambulatory Visit: Admit: 2021-11-17 | Discharge: 2021-11-18 | Payer: MEDICARE

## 2021-11-17 ENCOUNTER — Ambulatory Visit
Admit: 2021-11-17 | Discharge: 2021-11-18 | Payer: MEDICARE | Attending: Student in an Organized Health Care Education/Training Program | Primary: Student in an Organized Health Care Education/Training Program

## 2021-11-17 DIAGNOSIS — R1900 Intra-abdominal and pelvic swelling, mass and lump, unspecified site: Principal | ICD-10-CM

## 2021-11-17 DIAGNOSIS — C801 Malignant (primary) neoplasm, unspecified: Principal | ICD-10-CM

## 2021-11-17 MED ORDER — OXYCODONE 5 MG TABLET
ORAL_TABLET | ORAL | 0 refills | 10 days | Status: CP | PRN
Start: 2021-11-17 — End: ?

## 2021-11-17 MED ORDER — NALOXONE 4 MG/ACTUATION NASAL SPRAY
0 refills | 0 days | Status: CP
Start: 2021-11-17 — End: ?

## 2021-11-18 ENCOUNTER — Ambulatory Visit: Admit: 2021-11-18 | Discharge: 2021-11-19 | Payer: MEDICARE

## 2021-11-18 ENCOUNTER — Other Ambulatory Visit: Admit: 2021-11-18 | Discharge: 2021-11-19 | Payer: MEDICARE

## 2021-11-18 DIAGNOSIS — C801 Malignant (primary) neoplasm, unspecified: Principal | ICD-10-CM

## 2021-11-18 MED ORDER — ONDANSETRON HCL 8 MG TABLET
ORAL_TABLET | Freq: Three times a day (TID) | ORAL | 2 refills | 10.00000 days | Status: CP | PRN
Start: 2021-11-18 — End: ?

## 2021-11-18 MED ORDER — GABAPENTIN 100 MG CAPSULE
ORAL_CAPSULE | Freq: Three times a day (TID) | ORAL | 0 refills | 30.00000 days | Status: CP
Start: 2021-11-18 — End: 2021-12-18
  Filled 2021-11-18: qty 90, 30d supply, fill #0

## 2021-11-18 MED ORDER — PROCHLORPERAZINE MALEATE 10 MG TABLET
ORAL_TABLET | Freq: Four times a day (QID) | ORAL | 2 refills | 8.00000 days | Status: CP | PRN
Start: 2021-11-18 — End: ?

## 2021-11-18 MED ORDER — OXYCODONE 5 MG TABLET
ORAL_TABLET | ORAL | 0 refills | 10 days | Status: CP | PRN
Start: 2021-11-18 — End: ?
  Filled 2021-11-18: qty 42, 7d supply, fill #0

## 2021-11-19 ENCOUNTER — Ambulatory Visit: Admit: 2021-11-19 | Discharge: 2021-11-20 | Payer: MEDICARE

## 2021-11-19 DIAGNOSIS — R300 Dysuria: Principal | ICD-10-CM

## 2021-11-19 DIAGNOSIS — R350 Frequency of micturition: Principal | ICD-10-CM

## 2021-11-20 ENCOUNTER — Ambulatory Visit: Admit: 2021-11-20 | Payer: MEDICARE

## 2021-11-20 ENCOUNTER — Ambulatory Visit
Admit: 2021-11-20 | Discharge: 2021-12-11 | Disposition: A | Discharge status: Rehabilitation Facility | Payer: MEDICARE | Admitting: Student in an Organized Health Care Education/Training Program

## 2021-11-20 ENCOUNTER — Encounter: Admit: 2021-11-20 | Payer: MEDICARE | Attending: Student in an Organized Health Care Education/Training Program

## 2021-11-24 ENCOUNTER — Ambulatory Visit
Admit: 2021-11-24 | Discharge: 2021-12-01 | Payer: MEDICARE | Attending: Radiation Oncology | Primary: Radiation Oncology

## 2021-11-24 ENCOUNTER — Ambulatory Visit
Admit: 2021-11-24 | Discharge: 2021-11-26 | Payer: MEDICARE | Attending: Radiation Oncology | Primary: Radiation Oncology

## 2021-11-24 ENCOUNTER — Ambulatory Visit: Admit: 2021-11-24 | Discharge: 2021-11-25 | Payer: MEDICARE

## 2021-11-24 MED ORDER — LIDOCAINE-PRILOCAINE 2.5 %-2.5 % TOPICAL CREAM
Freq: Every day | TOPICAL | 2 refills | 0 days | Status: CP | PRN
Start: 2021-11-24 — End: 2022-11-24

## 2021-11-25 ENCOUNTER — Ambulatory Visit: Admit: 2021-11-25 | Discharge: 2021-11-26 | Attending: Radiation Oncology | Primary: Radiation Oncology

## 2021-11-26 ENCOUNTER — Ambulatory Visit: Admit: 2021-11-26 | Discharge: 2021-11-27

## 2021-11-27 ENCOUNTER — Ambulatory Visit: Admit: 2021-11-27 | Discharge: 2021-11-28

## 2021-11-30 ENCOUNTER — Ambulatory Visit: Admit: 2021-11-30 | Discharge: 2021-12-01

## 2021-11-30 DIAGNOSIS — C55 Malignant neoplasm of uterus, part unspecified: Principal | ICD-10-CM

## 2021-11-30 MED ORDER — XARELTO 10 MG TABLET
ORAL_TABLET | Freq: Every day | ORAL | 0 refills | 30.00000 days
Start: 2021-11-30 — End: 2021-11-30

## 2021-11-30 MED ORDER — ELIQUIS 2.5 MG TABLET
ORAL_TABLET | Freq: Two times a day (BID) | ORAL | 0 refills | 30 days
Start: 2021-11-30 — End: 2021-11-30

## 2021-11-30 MED ORDER — ENOXAPARIN 40 MG/0.4 ML SUBCUTANEOUS SYRINGE
Freq: Every day | SUBCUTANEOUS | 0 refills | 30 days
Start: 2021-11-30 — End: 2021-11-30

## 2021-12-01 ENCOUNTER — Ambulatory Visit: Admit: 2021-12-01 | Discharge: 2021-12-02

## 2021-12-03 DIAGNOSIS — C801 Malignant (primary) neoplasm, unspecified: Principal | ICD-10-CM

## 2021-12-03 MED ORDER — MORPHINE ER 15 MG TABLET,EXTENDED RELEASE
ORAL_TABLET | Freq: Two times a day (BID) | ORAL | 0 refills | 30 days | Status: CP
Start: 2021-12-03 — End: 2022-01-02

## 2021-12-10 MED ORDER — METRONIDAZOLE 500 MG TABLET
ORAL_TABLET | Freq: Three times a day (TID) | ORAL | 0 refills | 11 days | Status: CP
Start: 2021-12-10 — End: 2021-12-21

## 2021-12-10 MED ORDER — TETRACYCLINE 500 MG CAPSULE
ORAL_CAPSULE | Freq: Four times a day (QID) | ORAL | 0 refills | 11 days | Status: CP
Start: 2021-12-10 — End: 2021-12-21

## 2021-12-11 DIAGNOSIS — G893 Neoplasm related pain (acute) (chronic): Principal | ICD-10-CM

## 2021-12-11 DIAGNOSIS — C801 Malignant (primary) neoplasm, unspecified: Principal | ICD-10-CM

## 2021-12-15 ENCOUNTER — Other Ambulatory Visit: Payer: Self-pay

## 2021-12-15 ENCOUNTER — Non-Acute Institutional Stay: Payer: BC Managed Care – PPO | Admitting: Student

## 2021-12-15 DIAGNOSIS — R112 Nausea with vomiting, unspecified: Secondary | ICD-10-CM

## 2021-12-15 DIAGNOSIS — R630 Anorexia: Secondary | ICD-10-CM

## 2021-12-15 DIAGNOSIS — R52 Pain, unspecified: Secondary | ICD-10-CM

## 2021-12-15 DIAGNOSIS — C569 Malignant neoplasm of unspecified ovary: Secondary | ICD-10-CM

## 2021-12-15 DIAGNOSIS — Z515 Encounter for palliative care: Secondary | ICD-10-CM

## 2021-12-15 NOTE — Progress Notes (Signed)
[facility] Consult Note Telephone: [phone]  Fax: [phone]   Date of encounter: 12/15/21 11:23 AM PATIENT NAME: Brandy Ewing [address] [place] [address]   [phone] (home)  DOB: 1951-03-25 MRN: [id number] PRIMARY CARE PROVIDER:    [facility],  [place] [address] [phone]  REFERRING PROVIDER:   Vilinda Boehringer, NP  RESPONSIBLE PARTY:    Contact Information     Name Relation Home Work Mobile   Brandy Ewing [phone]  [phone]   Brandy Ewing   [phone]        I met face to face with patient and family in [facility] facility. Palliative Care was asked to follow this patient by consultation request of  Vilinda Boehringer, NP to address advance care planning and complex medical decision making. This is the initial visit.   Husband at bedside; daughter Brandy Ewing via telephone.                                    ASSESSMENT AND PLAN / RECOMMENDATIONS:   Advance Care Planning/Goals of Care: Goals include to maximize quality of life and symptom management. Patient/health care surrogate gave his/her permission to discuss.Our advance care planning conversation included a discussion about:    The value and importance of advance care planning  Experiences with loved ones who have been seriously ill or have died  Exploration of personal, cultural or spiritual beliefs that might influence medical decisions  Exploration of goals of care in the event of a sudden injury or illness  Family in process of designating HPOA Patient and family to review MOST form  CODE STATUS: Full Code  Education provided on Palliative Medicine vs. Hospice services. Patient would like to return home once therapy is completed. Patient to have repeat imaging after upcoming treatment. We discussed transitioning to hospice should she no longer receive chemotherapy or aggressive  treatments. Palliative Medicine will continue to provide support. Patient will need hospital bed and w/c upon discharge home; daughter to speak with facility SW regarding discharge planning.   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 Plan to follow with imaging, then base plan on results. Will continue therapy as directed.   Nausea/Vomiting: Increase Zofran to 8 mg, ODT every 8 hours PRN, compazine PRN. Take Zofran prior to eating and taking other medications.  Appetite: decline in appetite. Continue to offer foods patient enjoys, nutritional supplement as directed.   Pain: patient's pain is managed with current regimen. Continue MS Contin 15 mg BID, tylenol QID, oxycodone PRN, lidocaine 5% patch, gabapentin 300 mg TID.   Follow up Palliative Care Visit: Palliative care will continue to follow for complex medical decision making, advance care planning, and clarification of goals. Return 2-4 weeks or prn.   This visit was coded based on medical decision making (MDM).  PPS: 30%  HOSPICE ELIGIBILITY/DIAGNOSIS: TBD  Chief Complaint: Palliative Initial Consult, nausea and vomiting.  HISTORY OF PRESENT ILLNESS:  Brandy Ewing is a 71 y.o. year old female with adenocarcinoma stage IV with lesions to pubic bone and bilateral lungs. She has received palliative radiation and 2 doses of chemotherapy. Her next chemotherapy is scheduled February 8th.   She is currently in [facility] receiving PT. Goals of care discussed. She feels her pain  is well-managed with current regimen. Brandy Ewing expressed that her goals are to regain some strength and function to be able to transition to home. She also reports poor appetite and nausea and vomiting. She is attempting to drink 2-3 Ensure Clears daily.   History obtained from review of EMR, discussion with primary team, and interview with family, facility  staff/caregiver and/or Brandy Ewing.  I reviewed available labs, medications, imaging, studies and related documents from the EMR.  Records reviewed and summarized above.   ROS  General: NAD EYES: denies vision changes ENMT: denies dysphagia Cardiovascular: denies chest pain Pulmonary: denies cough, SOB with exertion Abdomen: reports poor appetite, denies constipation, endorses continence of bowel GU: Foley catheter in place MSK: weakness, no falls reported Skin: denies rashes or wounds Neurological: denies pain, denies insomnia Psych: Reports feeling emotional, "tired of feeling sick" Heme/lymph/immuno: denies bruises, abnormal bleeding  Physical Exam: SpO2 90% on RA, 92% on 2 LNC, HR 104 Current and past weights: Constitutional: NAD General: frail appearing, thin  EYES: anicteric sclera, lids intact, no discharge  ENMT: intact hearing, oral mucous membranes moist, dentition intact CV: S1S2, RRR, no LE edema Pulmonary: LCTA, no increased work of breathing, no cough, room air Abdomen: normo-active BS + 4 quadrants, soft and non tender, no ascites GU: deferred MSK: no sarcopenia, moves all extremities, ambulatory Skin: warm and dry, no rashes or wounds on visible skin Neuro: generalized weakness,  no cognitive impairment Psych: non-anxious affect, A and O x 3, tearful at times Hem/lymph/immuno: no widespread bruising CURRENT PROBLEM LIST:  Patient Active Problem List   Diagnosis Date Noted   COVID-19 virus infection 11/09/2019   Vitamin D deficiency 11/08/2019   Hypoxemia 11/04/2019   CAP (community acquired pneumonia) 11/04/2019   Glaucoma 11/04/2019   Essential hypertension 02/17/2018   PAST MEDICAL HISTORY:  Active Ambulatory Problems    Diagnosis Date Noted   Essential hypertension 02/17/2018   Hypoxemia 11/04/2019   CAP (community acquired pneumonia) 11/04/2019   Glaucoma 11/04/2019   Vitamin D deficiency 11/08/2019   COVID-19 virus infection 11/09/2019    Resolved Ambulatory Problems    Diagnosis Date Noted   Suspected COVID-19 virus infection 11/04/2019   Past Medical History:  Diagnosis Date   Hypertension    SOCIAL HX: Retired Oncologist, 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    aspirin EC 81 MG tablet Take 81 mg by mouth daily.    chlorpheniramine-HYDROcodone (TUSSIONEX) 10-8 MG/5ML SUER Take 5 mLs by mouth every 12 (twelve) hours as needed for cough.   cholecalciferol (VITAMIN D) 25 MCG tablet Take 1 tablet (1,000 Units total) by mouth daily.   NONFORMULARY OR COMPOUNDED ITEM Place 1 drop into both eyes daily. TIMOLOL 0.5% / BRIMONIDINE 0.2% / DORZOLAMIDE 2%   NONFORMULARY OR COMPOUNDED ITEM Place 1 drop into both eyes at bedtime. TIMOLOL 0.5% / BRIMONIDINE 0.2% / DORZOLAMIDE 2% / LATANOPROST 0.005%   No facility-administered encounter medications on file as  of 12/15/2021.   Thank you for the opportunity to participate in the care of Ms. Splitt.  The palliative care team will continue to follow. Please call our office at [phone] if we can be of additional assistance.    Zachery Dakins, RN, DNP student  Ezekiel Slocumb, NP   COVID-19 PATIENT SCREENING TOOL Asked and negative response unless otherwise noted:  Have you had symptoms of covid, tested positive or been in contact with someone with symptoms/positive test in the past 5-10 days? No

## 2021-12-23 ENCOUNTER — Ambulatory Visit: Admit: 2021-12-23 | Payer: PRIVATE HEALTH INSURANCE

## 2021-12-24 ENCOUNTER — Non-Acute Institutional Stay: Payer: BC Managed Care – PPO | Admitting: Student

## 2021-12-24 ENCOUNTER — Other Ambulatory Visit: Payer: Self-pay

## 2021-12-24 DIAGNOSIS — R630 Anorexia: Secondary | ICD-10-CM

## 2021-12-24 DIAGNOSIS — R52 Pain, unspecified: Secondary | ICD-10-CM

## 2021-12-24 DIAGNOSIS — R110 Nausea: Secondary | ICD-10-CM

## 2021-12-24 DIAGNOSIS — Z515 Encounter for palliative care: Secondary | ICD-10-CM

## 2021-12-24 DIAGNOSIS — C569 Malignant neoplasm of unspecified ovary: Secondary | ICD-10-CM

## 2021-12-24 DIAGNOSIS — R531 Weakness: Secondary | ICD-10-CM

## 2021-12-24 NOTE — Progress Notes (Signed)
[facility] Consult Note Telephone: [phone]  Fax: [phone]    Date of encounter: 12/24/21 11:03 AM PATIENT NAME: Brandy Ewing [address] [place] [address]   [phone] (home)  DOB: 1951-11-07 MRN: [id number] PRIMARY CARE PROVIDER:    [facility],  [place] [address] [phone]  REFERRING PROVIDER:   Vilinda Boehringer, NP  RESPONSIBLE PARTY:    Contact Information     Name Relation Home Work Mobile   ANTONIQUE, LANGFORD [phone]  [phone]   Astraea, Gaughran   [phone]        I met face to face with patient and family in the facility. Palliative Care was asked to follow this patient by consultation request of  Vilinda Boehringer, NP to address advance care planning and complex medical decision making. This is a follow up visit.                                   ASSESSMENT AND PLAN / RECOMMENDATIONS:   Advance Care Planning/Goals of Care: Goals include to maximize quality of life and symptom management. Patient/health care surrogate gave his/her permission to discuss. 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 Plan to follow with imaging, then base plan on results. Will continue therapy as directed.  Nausea-currently managed with Zofran 4 mg BID and once daily PRN, compazine PRN.   Generalized weakness-patient receiving therapy; weakness improving. She ambulated for the first time yesterday.  Appetite: appetite starting to improve; husband bringing in foods.  Continue to offer foods patient enjoys, nutritional supplement as directed.   Pain-currently managed. Continue MS Contin, oxycodone PRN, tylenol, gabapentin, lidocaine patch.  Follow up Palliative Care Visit: Palliative care will continue to follow for complex medical decision making, advance care planning, and clarification of goals. Return in 4 weeks or prn.  This visit was coded based on medical decision making (MDM).  PPS: 40%, weak  HOSPICE ELIGIBILITY/DIAGNOSIS: TBD  Chief Complaint: Palliative Medicine follow up visit.   HISTORY OF PRESENT ILLNESS:  Brandy Ewing is a 71 y.o. year old female  with  adenocarcinoma stage IV with lesions to pubic bone and bilateral lungs. She has received palliative radiation and 2 doses of chemotherapy. Her next chemotherapy is scheduled February 8th.     Currently receiving IV fluids x 2 liters. Nausea and pain have improved. Denies shortness of breath; she has been on room air x past 3 days. Working with therapy; doing better. She ambulated in room yesterday for the first time. Appetite has improved. A 10-point review of systems is negative, except for the pertinent positives and negatives detailed in the HPI.   History obtained from review of EMR, discussion with primary team, and interview with family, facility staff/caregiver and/or Ms. Ciulla.  I reviewed available labs, medications, imaging, studies and related documents from the EMR.  Records reviewed and summarized above.    Physical Exam: Pulse 76, resp 16  General: frail appearing, thin EYES: anicteric sclera, lids intact,  no discharge  ENMT: intact hearing, oral mucous membranes moist, dentition intact CV: S1S2, RRR, no LE edema Pulmonary: LCTA, no increased work of breathing, no cough, room air Abdomen: normo-active BS + 4 quadrants, soft and non tender, no ascites GU: deferred MSK: sarcopenia, moves all extremities Skin: warm and dry, no rashes or wounds on visible skin Neuro:  generalized weakness, A & O  3 Psych: non-anxious affect, pleasant Hem/lymph/immuno: no widespread bruising   Thank you for the opportunity to participate in the care of Ms. Gillies.  The palliative care team will continue to follow. Please call our office at [phone] if we can be of additional assistance.   Ezekiel Slocumb, NP   COVID-19 PATIENT SCREENING TOOL Asked and negative response unless otherwise noted:   Have you had symptoms of covid, tested positive or been in contact with someone with symptoms/positive test in the past 5-10 days? No

## 2021-12-30 ENCOUNTER — Other Ambulatory Visit: Admit: 2021-12-30 | Discharge: 2021-12-31 | Payer: PRIVATE HEALTH INSURANCE

## 2021-12-30 ENCOUNTER — Ambulatory Visit: Admit: 2021-12-30 | Discharge: 2021-12-31 | Payer: PRIVATE HEALTH INSURANCE

## 2021-12-30 DIAGNOSIS — C801 Malignant (primary) neoplasm, unspecified: Principal | ICD-10-CM

## 2021-12-31 MED ORDER — DEXAMETHASONE 4 MG TABLET
ORAL_TABLET | ORAL | 0 refills | 6 days | Status: CP
Start: 2021-12-31 — End: 2022-01-06

## 2022-01-01 DIAGNOSIS — C801 Malignant (primary) neoplasm, unspecified: Principal | ICD-10-CM

## 2022-01-01 MED ORDER — PROCHLORPERAZINE MALEATE 10 MG TABLET
ORAL_TABLET | Freq: Four times a day (QID) | ORAL | 2 refills | 8 days | Status: CP | PRN
Start: 2022-01-01 — End: ?

## 2022-01-01 MED ORDER — MORPHINE ER 15 MG TABLET,EXTENDED RELEASE
ORAL_TABLET | Freq: Two times a day (BID) | ORAL | 0 refills | 30.00000 days | Status: CP
Start: 2022-01-01 — End: 2022-01-31

## 2022-01-01 MED ORDER — SENNOSIDES 8.6 MG TABLET
ORAL_TABLET | Freq: Every day | ORAL | 5 refills | 30 days | Status: CP
Start: 2022-01-01 — End: ?

## 2022-01-08 ENCOUNTER — Ambulatory Visit
Admit: 2022-01-08 | Discharge: 2022-01-11 | Disposition: A | Discharge status: Home Health | Payer: PRIVATE HEALTH INSURANCE

## 2022-01-08 DIAGNOSIS — C801 Malignant (primary) neoplasm, unspecified: Principal | ICD-10-CM

## 2022-01-08 MED ORDER — ONDANSETRON HCL 8 MG TABLET
ORAL_TABLET | Freq: Three times a day (TID) | ORAL | 2 refills | 10 days | Status: SS | PRN
Start: 2022-01-08 — End: ?

## 2022-01-12 MED ORDER — CEFUROXIME AXETIL 500 MG TABLET
ORAL_TABLET | Freq: Two times a day (BID) | ORAL | 0 refills | 4.00000 days | Status: CP
Start: 2022-01-12 — End: 2022-01-16

## 2022-01-13 ENCOUNTER — Ambulatory Visit: Admit: 2022-01-13 | Discharge: 2022-01-13 | Payer: PRIVATE HEALTH INSURANCE

## 2022-01-13 MED ORDER — APIXABAN 5 MG TABLET
ORAL_TABLET | 2 refills | 0 days | Status: CP
Start: 2022-01-13 — End: ?

## 2022-01-14 ENCOUNTER — Ambulatory Visit: Payer: BC Managed Care – PPO | Admitting: Urology

## 2022-01-14 ENCOUNTER — Encounter: Payer: Self-pay | Admitting: Urology

## 2022-01-20 ENCOUNTER — Ambulatory Visit: Admit: 2022-01-20 | Discharge: 2022-01-21 | Payer: PRIVATE HEALTH INSURANCE

## 2022-01-20 ENCOUNTER — Ambulatory Visit: Admit: 2022-01-20 | Payer: PRIVATE HEALTH INSURANCE

## 2022-01-20 ENCOUNTER — Other Ambulatory Visit: Admit: 2022-01-20 | Discharge: 2022-01-21 | Payer: PRIVATE HEALTH INSURANCE

## 2022-01-20 DIAGNOSIS — C801 Malignant (primary) neoplasm, unspecified: Principal | ICD-10-CM

## 2022-01-20 DIAGNOSIS — G893 Neoplasm related pain (acute) (chronic): Principal | ICD-10-CM

## 2022-01-20 MED ORDER — ONDANSETRON 4 MG DISINTEGRATING TABLET
ORAL_TABLET | Freq: Three times a day (TID) | ORAL | 2 refills | 20.00000 days | Status: CP | PRN
Start: 2022-01-20 — End: 2022-02-19

## 2022-01-20 MED ORDER — DEXAMETHASONE 4 MG TABLET
ORAL_TABLET | 2 refills | 0 days | Status: CP
Start: 2022-01-20 — End: ?

## 2022-01-22 ENCOUNTER — Ambulatory Visit: Admit: 2022-01-22 | Discharge: 2022-01-23 | Payer: PRIVATE HEALTH INSURANCE

## 2022-02-05 DIAGNOSIS — G893 Neoplasm related pain (acute) (chronic): Principal | ICD-10-CM

## 2022-02-05 DIAGNOSIS — C801 Malignant (primary) neoplasm, unspecified: Principal | ICD-10-CM

## 2022-02-05 DIAGNOSIS — R1900 Intra-abdominal and pelvic swelling, mass and lump, unspecified site: Principal | ICD-10-CM

## 2022-02-10 ENCOUNTER — Ambulatory Visit: Admit: 2022-02-10 | Discharge: 2022-02-11 | Payer: PRIVATE HEALTH INSURANCE

## 2022-02-10 ENCOUNTER — Other Ambulatory Visit: Admit: 2022-02-10 | Discharge: 2022-02-11 | Payer: PRIVATE HEALTH INSURANCE

## 2022-02-10 DIAGNOSIS — C801 Malignant (primary) neoplasm, unspecified: Principal | ICD-10-CM

## 2022-02-10 MED ORDER — DEXAMETHASONE 4 MG TABLET
ORAL_TABLET | 1 refills | 0 days | Status: CP
Start: 2022-02-10 — End: ?

## 2022-02-10 MED ORDER — ONDANSETRON 4 MG DISINTEGRATING TABLET
ORAL_TABLET | Freq: Three times a day (TID) | ORAL | 2 refills | 20 days | Status: CP | PRN
Start: 2022-02-10 — End: 2022-03-12

## 2022-03-03 ENCOUNTER — Ambulatory Visit
Admit: 2022-03-03 | Discharge: 2022-03-03 | Payer: PRIVATE HEALTH INSURANCE | Attending: Gynecologic Oncology | Primary: Gynecologic Oncology

## 2022-03-03 ENCOUNTER — Other Ambulatory Visit: Admit: 2022-03-03 | Discharge: 2022-03-03 | Payer: PRIVATE HEALTH INSURANCE

## 2022-03-03 ENCOUNTER — Ambulatory Visit: Admit: 2022-03-03 | Discharge: 2022-03-03 | Payer: PRIVATE HEALTH INSURANCE

## 2022-03-03 DIAGNOSIS — C801 Malignant (primary) neoplasm, unspecified: Principal | ICD-10-CM

## 2022-03-03 DIAGNOSIS — E878 Other disorders of electrolyte and fluid balance, not elsewhere classified: Principal | ICD-10-CM

## 2022-03-03 DIAGNOSIS — T451X5A Adverse effect of antineoplastic and immunosuppressive drugs, initial encounter: Principal | ICD-10-CM

## 2022-03-03 DIAGNOSIS — D6481 Anemia due to antineoplastic chemotherapy: Principal | ICD-10-CM

## 2022-03-03 DIAGNOSIS — G620 Drug-induced polyneuropathy: Principal | ICD-10-CM

## 2022-03-03 DIAGNOSIS — R1900 Intra-abdominal and pelvic swelling, mass and lump, unspecified site: Principal | ICD-10-CM

## 2022-03-03 DIAGNOSIS — G893 Neoplasm related pain (acute) (chronic): Principal | ICD-10-CM

## 2022-03-03 MED ORDER — GABAPENTIN 300 MG CAPSULE
ORAL_CAPSULE | Freq: Two times a day (BID) | ORAL | 2 refills | 30 days | Status: CP
Start: 2022-03-03 — End: ?

## 2022-03-03 MED ORDER — LIDOCAINE 4 % TOPICAL CREAM
Freq: Every day | TOPICAL | 2 refills | 0 days | Status: CP | PRN
Start: 2022-03-03 — End: ?

## 2022-03-09 ENCOUNTER — Telehealth: Payer: Self-pay | Admitting: Nurse Practitioner

## 2022-03-09 ENCOUNTER — Encounter: Payer: BC Managed Care – PPO | Admitting: Nurse Practitioner

## 2022-03-09 NOTE — Telephone Encounter (Signed)
I went onto my chart video visit, also called Brandy Ewing home phone which was the incorrect number. I called Brandy Ewing cell phone, no answer, message left with contact information to return call. ?

## 2022-03-10 ENCOUNTER — Telehealth: Payer: Self-pay

## 2022-03-10 ENCOUNTER — Ambulatory Visit: Admit: 2022-03-10 | Discharge: 2022-03-10 | Payer: PRIVATE HEALTH INSURANCE

## 2022-03-10 ENCOUNTER — Encounter
Admit: 2022-03-10 | Discharge: 2022-03-10 | Payer: PRIVATE HEALTH INSURANCE | Attending: Gynecologic Oncology | Primary: Gynecologic Oncology

## 2022-03-10 ENCOUNTER — Other Ambulatory Visit: Admit: 2022-03-10 | Discharge: 2022-03-10 | Payer: PRIVATE HEALTH INSURANCE

## 2022-03-10 DIAGNOSIS — G893 Neoplasm related pain (acute) (chronic): Principal | ICD-10-CM

## 2022-03-10 DIAGNOSIS — T451X5A Adverse effect of antineoplastic and immunosuppressive drugs, initial encounter: Principal | ICD-10-CM

## 2022-03-10 DIAGNOSIS — C801 Malignant (primary) neoplasm, unspecified: Principal | ICD-10-CM

## 2022-03-10 DIAGNOSIS — R1900 Intra-abdominal and pelvic swelling, mass and lump, unspecified site: Principal | ICD-10-CM

## 2022-03-10 DIAGNOSIS — D6481 Anemia due to antineoplastic chemotherapy: Principal | ICD-10-CM

## 2022-03-10 MED ORDER — DEXAMETHASONE 4 MG TABLET
ORAL_TABLET | 0 refills | 0 days | Status: CP
Start: 2022-03-10 — End: ?

## 2022-03-10 MED ORDER — LIDOCAINE-PRILOCAINE 2.5 %-2.5 % TOPICAL CREAM
Freq: Every day | TOPICAL | 2 refills | 0 days | Status: CP | PRN
Start: 2022-03-10 — End: 2023-03-10

## 2022-03-10 MED ORDER — LORAZEPAM 0.5 MG TABLET
ORAL_TABLET | Freq: Once | ORAL | 0 refills | 0 days | Status: CP | PRN
Start: 2022-03-10 — End: ?

## 2022-03-10 NOTE — Telephone Encounter (Signed)
Spoke with patient and daughter who both agreed to palliative care. Discussed co pay and palliative care.  Virtual my chart appt scheduled for 03/23/22 at 2:30 pm with [name], NP ?

## 2022-03-10 NOTE — Telephone Encounter (Signed)
RN call to schedule palliative consult. ?

## 2022-03-12 ENCOUNTER — Ambulatory Visit: Admit: 2022-03-12 | Discharge: 2022-03-13 | Payer: PRIVATE HEALTH INSURANCE

## 2022-03-23 ENCOUNTER — Encounter: Payer: BC Managed Care – PPO | Admitting: Nurse Practitioner

## 2022-03-24 NOTE — Progress Notes (Signed)
This encounter was created in error - please disregard.

## 2022-03-29 ENCOUNTER — Ambulatory Visit: Admit: 2022-03-29 | Discharge: 2022-03-30 | Payer: PRIVATE HEALTH INSURANCE

## 2022-03-31 ENCOUNTER — Ambulatory Visit: Admit: 2022-03-31 | Discharge: 2022-03-31 | Payer: PRIVATE HEALTH INSURANCE

## 2022-03-31 DIAGNOSIS — C801 Malignant (primary) neoplasm, unspecified: Principal | ICD-10-CM

## 2022-03-31 DIAGNOSIS — R1900 Intra-abdominal and pelvic swelling, mass and lump, unspecified site: Principal | ICD-10-CM

## 2022-03-31 DIAGNOSIS — D6481 Anemia due to antineoplastic chemotherapy: Principal | ICD-10-CM

## 2022-03-31 DIAGNOSIS — T451X5A Adverse effect of antineoplastic and immunosuppressive drugs, initial encounter: Principal | ICD-10-CM

## 2022-04-02 ENCOUNTER — Ambulatory Visit: Admit: 2022-04-02 | Discharge: 2022-04-03 | Payer: PRIVATE HEALTH INSURANCE

## 2022-04-06 ENCOUNTER — Encounter: Payer: Self-pay | Admitting: Nurse Practitioner

## 2022-04-06 ENCOUNTER — Telehealth: Payer: Self-pay | Admitting: Nurse Practitioner

## 2022-04-06 ENCOUNTER — Telehealth: Payer: BC Managed Care – PPO | Admitting: Nurse Practitioner

## 2022-04-06 DIAGNOSIS — R52 Pain, unspecified: Secondary | ICD-10-CM

## 2022-04-06 DIAGNOSIS — R531 Weakness: Secondary | ICD-10-CM

## 2022-04-06 DIAGNOSIS — C569 Malignant neoplasm of unspecified ovary: Secondary | ICD-10-CM

## 2022-04-06 DIAGNOSIS — Z515 Encounter for palliative care: Secondary | ICD-10-CM

## 2022-04-06 DIAGNOSIS — R110 Nausea: Secondary | ICD-10-CM

## 2022-04-06 NOTE — Progress Notes (Signed)
? ? ?Manufacturing [facility] ?[facility] Consult Note ?Telephone: [phone]  ?Fax: [phone]  ? ? ?Date of encounter: 04/06/22 ?11:51 AM ?PATIENT NAME: Brandy Ewing ?[address] ?[place] [address]   [phone] (home)  ?DOB: 11-15-1951 ?MRN: [id number] ?PRIMARY CARE PROVIDER:    ?[facility],  ?[place] [address] ?[phone] ? ?REFERRING PROVIDER:   ?[facility] ?[place],   [address] ?[phone] ? ?RESPONSIBLE PARTY:    ?Contact Information   ? ? Name Relation Home Work Mobile  ? Brandy Ewing, Brandy Ewing Spouse [phone]  [phone]  ? Brandy Ewing, Brandy Ewing Son   [phone]  ? ?  ? ?Due to the COVID-19 crisis, this visit was done via telemedicine from my office and it was initiated and consent by this patient and or family. ? ?I connected with  Brandy Ewing OR PROXY on 04/06/22 by telephone as video not available enabled telemedicine application and verified that I am speaking with the correct person using two identifiers. ?  ?I discussed the limitations of evaluation and management by telemedicine. The patient expressed understanding and agreed to proceed.  Palliative Care was asked to follow this patient by consultation request of  [facility] to address advance care planning and complex medical decision making. This is a follow up visit.                                  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 ?  ?Nausea-no further nausea, managed previously with Zofran 4 mg BID and once daily PRN, compazine PRN.  ?  ?Generalized weakness-weakness improving. Therapy completed, back to baseline ? ?Chemotherapy: Carboplatin/Paclitaxel ?Chemotherapy Cycle: Cycle 5 Day 1 (02/10/22)  ? ?Appetite: appetite better, Continue to offer foods patient enjoys, nutritional  supplement as directed. no noted weight loss or changes ?  ?Pain-currently managed. Continue gabapentin for peripheral neuropathy stable; dose increased to '600mg'$  qhs and would consider adding duloxetine from plan on 03/31/2022 with GYN/ONC. F/u with 2 weeks.  ? ?Follow up Palliative Care Visit: Palliative care will continue to follow for complex medical decision making, advance care planning, and clarification of goals. Return 6 weeks or prn. ? ?I spent 36 minutes providing this consultation. More than 50% of the time in this consultation was spent in counseling and care coordination. ?PPS: 50% ?Chief Complaint: Follow up palliative consult for complex medical decision making ?HISTORY OF PRESENT ILLNESS:  Brandy Ewing is a 71 y.o. year old female  with adenocarcinoma stage IV with lesions to pubic bone and bilateral lungs. She has received palliative radiation/chemotherapy. I called Brandy Ewing for [employer] visit, Brandy Ewing unable to access my chart video requested telephone PC f/u visit. We talked about limitations. We talked about how she has been feeling, ros, symptoms including nausea which has resolved. We talked about neuropathy pain managed by GYN/ONC with gabapentin which was recently increased. Brandy Ewing endorses it has helped. We talked about appetite, nutrition, supplements she likes apple flavor. We talked about recent Oncology visit, plan, testing. Brandy Ewing talked about her daughter helping to keep her appointments, advocating. We talked about quality of life, as she lives at home with her husband. 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  We talked about medical goals. We talked about role pc in poc with f/u visit, Brandy Ewing in agreement, scheduled. Therapeutic listening, emotional support provided. Questions answered. [id number] ? ?History obtained from review of EMR, discussion with primary team, and interview with family, facility staff/caregiver and/or Brandy Ewing.  ?I  reviewed available labs, medications, imaging, studies and related documents from the EMR.  Records reviewed and summarized above.  ? ?ROS ?10 point system reviewed all negative except HPI ? ?Physical Exam: ?deferred ?Thank you for the opportunity to participate in the care of Brandy Ewing.  The palliative care team will continue to follow. Please call our office at [phone] if we can be of additional assistance.  ? ?Or[place]ondo Holy[place] Z Brandy Brumbaugh, NP ?

## 2022-04-06 NOTE — Telephone Encounter (Signed)
I called Ms. Gose as my chart video scheduled for initial pc visit. No answer, did not get on appointment video, message left with contact information.  ?

## 2022-04-13 DIAGNOSIS — C801 Malignant (primary) neoplasm, unspecified: Principal | ICD-10-CM

## 2022-04-13 DIAGNOSIS — T451X5A Adverse effect of antineoplastic and immunosuppressive drugs, initial encounter: Principal | ICD-10-CM

## 2022-04-13 DIAGNOSIS — D6481 Anemia due to antineoplastic chemotherapy: Principal | ICD-10-CM

## 2022-04-13 DIAGNOSIS — R1900 Intra-abdominal and pelvic swelling, mass and lump, unspecified site: Principal | ICD-10-CM

## 2022-04-16 ENCOUNTER — Other Ambulatory Visit: Admit: 2022-04-16 | Discharge: 2022-04-17 | Payer: PRIVATE HEALTH INSURANCE

## 2022-04-16 ENCOUNTER — Encounter: Admit: 2022-04-16 | Discharge: 2022-04-17 | Payer: PRIVATE HEALTH INSURANCE

## 2022-04-16 ENCOUNTER — Ambulatory Visit: Admit: 2022-04-16 | Discharge: 2022-04-17 | Payer: PRIVATE HEALTH INSURANCE

## 2022-04-16 DIAGNOSIS — G620 Drug-induced polyneuropathy: Principal | ICD-10-CM

## 2022-04-16 DIAGNOSIS — C801 Malignant (primary) neoplasm, unspecified: Principal | ICD-10-CM

## 2022-04-16 DIAGNOSIS — T451X5A Adverse effect of antineoplastic and immunosuppressive drugs, initial encounter: Principal | ICD-10-CM

## 2022-04-16 DIAGNOSIS — R1900 Intra-abdominal and pelvic swelling, mass and lump, unspecified site: Principal | ICD-10-CM

## 2022-04-16 DIAGNOSIS — D6481 Anemia due to antineoplastic chemotherapy: Principal | ICD-10-CM

## 2022-04-16 MED ORDER — DEXAMETHASONE 4 MG TABLET
ORAL_TABLET | 0 refills | 0 days | Status: CP
Start: 2022-04-16 — End: ?

## 2022-04-16 MED ORDER — ONDANSETRON 4 MG DISINTEGRATING TABLET
ORAL_TABLET | Freq: Three times a day (TID) | ORAL | 2 refills | 20 days | Status: CP | PRN
Start: 2022-04-16 — End: ?

## 2022-04-19 MED ORDER — GABAPENTIN 300 MG CAPSULE
ORAL_CAPSULE | Freq: Two times a day (BID) | ORAL | 2 refills | 30 days | Status: CP
Start: 2022-04-19 — End: ?

## 2022-05-11 DIAGNOSIS — C801 Malignant (primary) neoplasm, unspecified: Principal | ICD-10-CM

## 2022-05-12 ENCOUNTER — Ambulatory Visit: Admit: 2022-05-12 | Discharge: 2022-05-12 | Payer: PRIVATE HEALTH INSURANCE

## 2022-05-12 DIAGNOSIS — C801 Malignant (primary) neoplasm, unspecified: Principal | ICD-10-CM

## 2022-05-12 DIAGNOSIS — R1900 Intra-abdominal and pelvic swelling, mass and lump, unspecified site: Principal | ICD-10-CM

## 2022-05-12 MED ORDER — PREDNISONE 5 MG TABLET
ORAL_TABLET | Freq: Every day | ORAL | 0 refills | 30 days | Status: CP
Start: 2022-05-12 — End: ?

## 2022-05-12 MED ORDER — LIDOCAINE 5 % TOPICAL PATCH
MEDICATED_PATCH | Freq: Two times a day (BID) | TRANSDERMAL | 2 refills | 15 days | Status: CP
Start: 2022-05-12 — End: 2023-05-12

## 2022-05-19 ENCOUNTER — Ambulatory Visit: Admit: 2022-05-19 | Discharge: 2022-05-20 | Payer: PRIVATE HEALTH INSURANCE

## 2022-05-19 ENCOUNTER — Encounter: Admit: 2022-05-19 | Discharge: 2022-05-20 | Payer: PRIVATE HEALTH INSURANCE

## 2022-05-19 ENCOUNTER — Other Ambulatory Visit: Admit: 2022-05-19 | Discharge: 2022-05-20 | Payer: PRIVATE HEALTH INSURANCE

## 2022-05-19 DIAGNOSIS — R971 Elevated cancer antigen 125 [CA 125]: Principal | ICD-10-CM

## 2022-05-19 DIAGNOSIS — T451X5A Adverse effect of antineoplastic and immunosuppressive drugs, initial encounter: Principal | ICD-10-CM

## 2022-05-19 DIAGNOSIS — Z79899 Other long term (current) drug therapy: Principal | ICD-10-CM

## 2022-05-19 DIAGNOSIS — C801 Malignant (primary) neoplasm, unspecified: Principal | ICD-10-CM

## 2022-05-19 DIAGNOSIS — M899 Disorder of bone, unspecified: Principal | ICD-10-CM

## 2022-05-19 DIAGNOSIS — D6481 Anemia due to antineoplastic chemotherapy: Principal | ICD-10-CM

## 2022-05-19 MED ORDER — POTASSIUM CHLORIDE ER 10 MEQ TABLET,EXTENDED RELEASE(PART/CRYST)
ORAL_TABLET | Freq: Every day | ORAL | 0 refills | 30 days | Status: CP
Start: 2022-05-19 — End: 2022-05-19

## 2022-05-19 MED ORDER — K-PHOS ORIGINAL 500 MG SOLUBLE TABLET
ORAL_TABLET | Freq: Two times a day (BID) | ORAL | 2 refills | 30 days | Status: CP
Start: 2022-05-19 — End: 2023-05-19

## 2022-05-20 MED ORDER — DEXAMETHASONE 4 MG TABLET
ORAL_TABLET | 0 refills | 0 days | Status: CP
Start: 2022-05-20 — End: ?

## 2022-05-20 MED ORDER — DULOXETINE 30 MG CAPSULE,DELAYED RELEASE
ORAL_CAPSULE | Freq: Every morning | ORAL | 0 refills | 30.00000 days | Status: CP
Start: 2022-05-20 — End: 2022-06-19

## 2022-06-08 ENCOUNTER — Other Ambulatory Visit: Payer: BC Managed Care – PPO | Admitting: Nurse Practitioner

## 2022-06-08 ENCOUNTER — Encounter: Payer: Self-pay | Admitting: Nurse Practitioner

## 2022-06-08 DIAGNOSIS — C569 Malignant neoplasm of unspecified ovary: Secondary | ICD-10-CM

## 2022-06-08 DIAGNOSIS — Z515 Encounter for palliative care: Secondary | ICD-10-CM

## 2022-06-08 DIAGNOSIS — R52 Pain, unspecified: Secondary | ICD-10-CM

## 2022-06-08 DIAGNOSIS — R531 Weakness: Secondary | ICD-10-CM

## 2022-06-08 NOTE — Progress Notes (Signed)
[facility] Consult Note Telephone: [phone]  Fax: [phone]    Date of encounter: 06/08/22 12:56 PM PATIENT NAME: ALEXIS REBER [address] [place] [address]   [phone] (home)  DOB: 1951/06/05 MRN: [id number] PRIMARY CARE PROVIDER:    [facility],  [place] [address] [phone]  REFERRING PROVIDER:   [facility] [place],  [place] [address] [phone]  RESPONSIBLE PARTY:    Contact Information     Name Relation Home Work Mobile   AADVIKA, KONEN [phone]  [phone]   Murrell, Elizondo   [phone]      I met face to face with patient and family in home. Palliative Care was asked to follow this patient by consultation request of  [facility] to address advance care planning and complex medical decision making. This is a follow up visit.                                  ASSESSMENT AND PLAN / RECOMMENDATIONS:  Advance Care Planning/Goals of Care: Goals include to maximize quality of life and symptom management. Patient/health care surrogate gave his/her permission to discuss. Our advance care planning conversation included a discussion about:    The value and importance of advance care planning  Experiences with loved ones who have been seriously ill or have died  Exploration of personal, cultural or spiritual beliefs that might influence medical decisions  Exploration of goals of care in the event of a sudden injury or illness  Identification of a healthcare agent  Review and updating or creation of an  advance directive document . Decision not to resuscitate or to de-escalate disease focused treatments due to poor prognosis. CODE STATUS: Full code  Symptom Management/Plan: Symptom Management/Plan: ACP: full code, discussed and reviewed most form, blank most form left for further discussion with her family. Will  revisit at next pc visit  2. 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   3. Nausea-no further nausea, managed previously with Zofran 4 mg BID and once daily PRN, compazine PRN.    4. Generalized weakness-weakness improving. Therapy completed, back to baseline   5. Appetite: improved, discussed nutrition, education completed. Ms. Kristensen endorses her husband does cook for her or gets take out, her appetite has been better. Ms. Jacobson endorses she does make sure she eats as it keeps her energy level up   6. Neuropathy; pain-currently managed. Continue gabapentin for peripheral neuropathy stable   Follow up Palliative Care Visit: Palliative care will continue to follow for complex medical decision making, advance care planning, and clarification of goals. Return 6 weeks or prn.   I spent 46 minutes providing this consultation. More than 50% of the time in this consultation was spent in counseling and care coordination. PPS: 50% Chief Complaint: Follow up palliative consult for complex medical decision making HISTORY OF PRESENT ILLNESS:  BRALEY LUCKENBAUGH is a 71 y.o. year old female  with adenocarcinoma stage IV with lesions to pubic bone and bilateral lungs. She has received palliative radiation/chemotherapy. I called Ms. Hegarty for confirmation of PC f/u visit, in agreement. I visited and observed Ms. Vasseur in her home. We talked about how she has been feeling, improved. Ms. Melka and I talked about ros, no further nausea.  We talked about pain, neuropathy at length, medications including gabapentin. We talked about upcoming Oncology plan, 1 more infusion then rescan. We talked about medical goals, reviewed MOST form. We talked about code status, will further discuss with family. We talked about family support. 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 We talked about quality of life. We talked about her functional  abilities, feeling more like herself, even helping her husband cook a little in the kitchen. We talked about daily routine. We talked about role pc in poc. We talked about f/u visit following scans. Ms. Alfonse Spruce in agreement, scheduled. Therapeutic listening, emotional support provided. I talked with Mr Avilla separately, update on visit, no new changes. Mr. Chou endorses Mrs. Haque is doing well. Questions answered.   We talked about medical goals. We talked about role pc in poc with f/u visit, Ms. Camus in agreement, scheduled. Therapeutic listening, emotional support provided. Questions answered.   History obtained from review of EMR, discussion with primary team, and interview with family, facility staff/caregiver and/or Ms. Forehand.  I reviewed available labs, medications, imaging, studies and related documents from the EMR.  Records reviewed and summarized above.   ROS 10 point system reviewed all negative except HPI  Physical Exam: Constitutional: NAD General: frail appearing, pleasant female CV: S1S2, RRR Pulmonary: LCTA, no increased work of breathing, no cough, room air Abdomen: soft and non tender MSK: ambulatory with walker Skin: warm and dry Neuro:  + generalized weakness,  no cognitive impairment Psych: non-anxious affect, A and O x 3 Thank you for the opportunity to participate in the care of Ms. Forehand.  The palliative care team will continue to follow. Please call our office at [phone] if we can be of additional assistance.   Lulla Linville Ihor Gully, NP

## 2022-06-09 ENCOUNTER — Encounter: Admit: 2022-06-09 | Discharge: 2022-06-09 | Payer: MEDICARE

## 2022-06-09 ENCOUNTER — Ambulatory Visit: Admit: 2022-06-09 | Discharge: 2022-06-09 | Payer: MEDICARE

## 2022-06-09 ENCOUNTER — Ambulatory Visit: Admit: 2022-06-09 | Discharge: 2022-06-09 | Payer: PRIVATE HEALTH INSURANCE

## 2022-06-09 ENCOUNTER — Other Ambulatory Visit: Admit: 2022-06-09 | Discharge: 2022-06-09 | Payer: MEDICARE

## 2022-06-09 DIAGNOSIS — T451X5A Adverse effect of antineoplastic and immunosuppressive drugs, initial encounter: Principal | ICD-10-CM

## 2022-06-09 DIAGNOSIS — D6481 Anemia due to antineoplastic chemotherapy: Principal | ICD-10-CM

## 2022-06-09 DIAGNOSIS — R899 Unspecified abnormal finding in specimens from other organs, systems and tissues: Principal | ICD-10-CM

## 2022-06-09 DIAGNOSIS — E878 Other disorders of electrolyte and fluid balance, not elsewhere classified: Principal | ICD-10-CM

## 2022-06-09 DIAGNOSIS — Z79899 Other long term (current) drug therapy: Principal | ICD-10-CM

## 2022-06-09 DIAGNOSIS — C801 Malignant (primary) neoplasm, unspecified: Principal | ICD-10-CM

## 2022-06-09 DIAGNOSIS — R971 Elevated cancer antigen 125 [CA 125]: Principal | ICD-10-CM

## 2022-06-09 DIAGNOSIS — R1900 Intra-abdominal and pelvic swelling, mass and lump, unspecified site: Principal | ICD-10-CM

## 2022-06-09 MED ORDER — PREDNISONE 5 MG TABLET
ORAL_TABLET | Freq: Every day | ORAL | 5 refills | 30 days | Status: CP
Start: 2022-06-09 — End: ?

## 2022-06-10 MED ORDER — DULOXETINE 20 MG CAPSULE,DELAYED RELEASE
ORAL_CAPSULE | Freq: Every day | ORAL | 0 refills | 30.00000 days | Status: CP
Start: 2022-06-10 — End: 2022-07-10

## 2022-06-23 ENCOUNTER — Ambulatory Visit: Admit: 2022-06-23 | Discharge: 2022-06-24 | Payer: MEDICARE

## 2022-06-30 ENCOUNTER — Ambulatory Visit: Admit: 2022-06-30 | Discharge: 2022-06-30 | Payer: MEDICARE

## 2022-06-30 DIAGNOSIS — C801 Malignant (primary) neoplasm, unspecified: Principal | ICD-10-CM

## 2022-06-30 DIAGNOSIS — E878 Other disorders of electrolyte and fluid balance, not elsewhere classified: Principal | ICD-10-CM

## 2022-06-30 DIAGNOSIS — R899 Unspecified abnormal finding in specimens from other organs, systems and tissues: Principal | ICD-10-CM

## 2022-06-30 DIAGNOSIS — R799 Abnormal finding of blood chemistry, unspecified: Principal | ICD-10-CM

## 2022-06-30 DIAGNOSIS — D8989 Other specified disorders involving the immune mechanism, not elsewhere classified: Principal | ICD-10-CM

## 2022-06-30 DIAGNOSIS — C549 Malignant neoplasm of corpus uteri, unspecified: Principal | ICD-10-CM

## 2022-07-02 ENCOUNTER — Other Ambulatory Visit: Admit: 2022-07-02 | Discharge: 2022-07-03 | Payer: MEDICARE

## 2022-07-02 ENCOUNTER — Ambulatory Visit: Admit: 2022-07-02 | Discharge: 2022-07-03 | Payer: MEDICARE

## 2022-07-02 DIAGNOSIS — E878 Other disorders of electrolyte and fluid balance, not elsewhere classified: Principal | ICD-10-CM

## 2022-07-02 DIAGNOSIS — D8989 Other specified disorders involving the immune mechanism, not elsewhere classified: Principal | ICD-10-CM

## 2022-07-02 DIAGNOSIS — R799 Abnormal finding of blood chemistry, unspecified: Principal | ICD-10-CM

## 2022-07-02 DIAGNOSIS — C801 Malignant (primary) neoplasm, unspecified: Principal | ICD-10-CM

## 2022-07-02 DIAGNOSIS — C549 Malignant neoplasm of corpus uteri, unspecified: Principal | ICD-10-CM

## 2022-07-02 DIAGNOSIS — R899 Unspecified abnormal finding in specimens from other organs, systems and tissues: Principal | ICD-10-CM

## 2022-07-02 MED ORDER — APIXABAN 5 MG TABLET
ORAL_TABLET | 2 refills | 0 days | Status: CP
Start: 2022-07-02 — End: ?

## 2022-07-12 MED ORDER — PREDNISONE 5 MG TABLET
ORAL_TABLET | Freq: Every day | ORAL | 5 refills | 30 days | Status: CP
Start: 2022-07-12 — End: ?

## 2022-07-19 MED ORDER — DULOXETINE 20 MG CAPSULE,DELAYED RELEASE
ORAL_CAPSULE | Freq: Every day | ORAL | 0 refills | 30 days | Status: CP
Start: 2022-07-19 — End: ?

## 2022-07-21 ENCOUNTER — Ambulatory Visit: Admit: 2022-07-21 | Discharge: 2022-07-22 | Payer: MEDICARE

## 2022-07-21 ENCOUNTER — Other Ambulatory Visit: Admit: 2022-07-21 | Discharge: 2022-07-22 | Payer: MEDICARE

## 2022-07-21 DIAGNOSIS — C801 Malignant (primary) neoplasm, unspecified: Principal | ICD-10-CM

## 2022-07-21 DIAGNOSIS — C549 Malignant neoplasm of corpus uteri, unspecified: Principal | ICD-10-CM

## 2022-07-21 DIAGNOSIS — R799 Abnormal finding of blood chemistry, unspecified: Principal | ICD-10-CM

## 2022-07-21 DIAGNOSIS — R899 Unspecified abnormal finding in specimens from other organs, systems and tissues: Principal | ICD-10-CM

## 2022-07-21 DIAGNOSIS — D8989 Other specified disorders involving the immune mechanism, not elsewhere classified: Principal | ICD-10-CM

## 2022-07-21 DIAGNOSIS — E878 Other disorders of electrolyte and fluid balance, not elsewhere classified: Principal | ICD-10-CM

## 2022-07-22 ENCOUNTER — Encounter: Payer: Self-pay | Admitting: Nurse Practitioner

## 2022-07-22 ENCOUNTER — Telehealth: Payer: BC Managed Care – PPO | Admitting: Nurse Practitioner

## 2022-07-22 DIAGNOSIS — R531 Weakness: Secondary | ICD-10-CM

## 2022-07-22 DIAGNOSIS — C569 Malignant neoplasm of unspecified ovary: Secondary | ICD-10-CM

## 2022-07-22 DIAGNOSIS — Z515 Encounter for palliative care: Secondary | ICD-10-CM

## 2022-07-22 DIAGNOSIS — R52 Pain, unspecified: Secondary | ICD-10-CM

## 2022-07-22 NOTE — Progress Notes (Signed)
[facility] Consult Note Telephone: [phone]  Fax: [phone]    Date of encounter: 07/22/22 2:42 PM PATIENT NAME: Brandy Ewing [address] [place] [address]   [phone] (home)  DOB: 06/06/1951 MRN: [id number] PRIMARY CARE PROVIDER:    [facility],  [place] [address] [phone]  RESPONSIBLE PARTY:    Contact Information     Name Relation Home Work Mobile   TEIRRA, CARAPIA [phone]  [phone]   [name], [name]   [phone]      Due to the COVID-19 crisis, this visit was done via telemedicine from my office and it was initiated and consent by this patient and or family.  I connected with  [name] [facility] PROXY on 07/22/22 by telephone as video not available enabled telemedicine application and verified that I am speaking with the correct person.   I discussed the limitations of evaluation and management by telemedicine. The patient expressed understanding and agreed to proceed. Palliative Care was asked to follow this patient by consultation request of  [facility] to address advance care planning and complex medical decision making. This is a follow up visit.                                  ASSESSMENT AND PLAN / RECOMMENDATIONS:  Symptom Management/Plan: ACP: full code, ongoing discussions   2. 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   3. Nausea-no further nausea, managed previously with Zofran 4 mg BID and once daily PRN, compazine PRN.    4. Generalized weakness-continues to improve; encourage times of rest.   5. Appetite: improved, weight gain, discussed nutrition, education completed. [name] endorses her family does help her with meals, her husband cooks, adult children actively helping her.    6. Neuropathy; pain-currently managed. Continue gabapentin for  peripheral neuropathy stable   Follow up Palliative Care Visit: Palliative care will continue to follow for complex medical decision making, advance care planning, and clarification of goals. Return 6 weeks or prn.   I spent 31 minutes providing this consultation. More than 50% of the time in this consultation was spent in counseling and care coordination. PPS: 50% Chief Complaint: Follow up palliative consult for complex medical decision making HISTORY OF PRESENT ILLNESS:  SHALVA ROZYCKI is a 71 y.o. year old female  with adenocarcinoma stage IV with lesions to pubic bone and bilateral lungs. She has received palliative radiation/chemotherapy. I called [name] for telemedicine telephonic f/u visit as video not available. Message left and [name] returned call, in agreement. We talked about how she has been feeling. [name] endorses she has been doing better. Talked about recent Oncology appointment with [facility] started, no side effects as of yet and possible pending sgy. We talked about ros, including specially pain, nausea, appetite which has been good with weight gain. We talked about her functional ability continues to improve. No recent falls, infections, hospitalizations. We talked about medical goals, quality of life, role pc in  goc. [name] talked at length about her husband and adult children who help her with meals, cleaning, going to MD appointments. We talked about f/u pc visit, [name] in agreement, scheduled. Therapeutic listening, emotional support provided. Questions answered. Contact information provided.   Therapeutic listening, emotional support  provided. I talked with Mr Hiott separately, update on visit, no new changes. Mr. Gattuso endorses [name] is doing well. Questions answered.    We talked about medical goals. We talked about role pc in poc with f/u visit, [name] in agreement, scheduled. Therapeutic listening, emotional support provided. Questions answered.    History obtained from review of EMR, discussion with primary team, and interview with family, facility staff/caregiver and/or [name].  I reviewed available labs, medications, imaging, studies and related documents from the EMR.  Records reviewed and summarized above.    ROS 10 point system reviewed all negative except HPI   Physical Exam: Deferred Thank you for the opportunity to participate in the care of [name].  The palliative care team will continue to follow. Please call our office at [phone] if we can be of additional assistance.   Ismar Yabut Ihor Gully, NP

## 2022-07-23 MED ORDER — GABAPENTIN 300 MG CAPSULE
ORAL_CAPSULE | Freq: Two times a day (BID) | ORAL | 5 refills | 30 days | Status: CP
Start: 2022-07-23 — End: ?

## 2022-07-23 MED ORDER — APIXABAN 5 MG TABLET
ORAL_TABLET | Freq: Two times a day (BID) | ORAL | 5 refills | 30 days | Status: CP
Start: 2022-07-23 — End: ?

## 2022-07-23 MED ORDER — ONDANSETRON 4 MG DISINTEGRATING TABLET
ORAL_TABLET | Freq: Three times a day (TID) | ORAL | 2 refills | 20 days | Status: CP | PRN
Start: 2022-07-23 — End: ?

## 2022-07-23 MED ORDER — LIDOCAINE-PRILOCAINE 2.5 %-2.5 % TOPICAL CREAM
Freq: Every day | TOPICAL | 2 refills | 0 days | Status: CP | PRN
Start: 2022-07-23 — End: 2023-07-23

## 2022-08-11 ENCOUNTER — Ambulatory Visit: Admit: 2022-08-11 | Discharge: 2022-08-12 | Payer: MEDICARE

## 2022-08-11 ENCOUNTER — Other Ambulatory Visit: Admit: 2022-08-11 | Discharge: 2022-08-12 | Payer: MEDICARE

## 2022-08-11 DIAGNOSIS — C801 Malignant (primary) neoplasm, unspecified: Principal | ICD-10-CM

## 2022-08-11 DIAGNOSIS — C549 Malignant neoplasm of corpus uteri, unspecified: Principal | ICD-10-CM

## 2022-08-11 DIAGNOSIS — R530 Neoplastic (malignant) related fatigue: Principal | ICD-10-CM

## 2022-08-11 DIAGNOSIS — G620 Drug-induced polyneuropathy: Principal | ICD-10-CM

## 2022-08-11 DIAGNOSIS — T451X5A Adverse effect of antineoplastic and immunosuppressive drugs, initial encounter: Principal | ICD-10-CM

## 2022-08-11 DIAGNOSIS — R899 Unspecified abnormal finding in specimens from other organs, systems and tissues: Principal | ICD-10-CM

## 2022-08-11 DIAGNOSIS — D8989 Other specified disorders involving the immune mechanism, not elsewhere classified: Principal | ICD-10-CM

## 2022-08-11 DIAGNOSIS — E878 Other disorders of electrolyte and fluid balance, not elsewhere classified: Principal | ICD-10-CM

## 2022-08-11 DIAGNOSIS — R799 Abnormal finding of blood chemistry, unspecified: Principal | ICD-10-CM

## 2022-08-11 MED ORDER — DULOXETINE 20 MG CAPSULE,DELAYED RELEASE
ORAL_CAPSULE | Freq: Every day | ORAL | 3 refills | 30 days | Status: CP
Start: 2022-08-11 — End: ?

## 2022-08-27 ENCOUNTER — Ambulatory Visit: Admit: 2022-08-27 | Discharge: 2022-08-28 | Payer: MEDICARE

## 2022-08-31 MED ORDER — LORAZEPAM 0.5 MG TABLET
ORAL_TABLET | Freq: Three times a day (TID) | ORAL | 0 refills | 2 days | Status: CP | PRN
Start: 2022-08-31 — End: 2023-08-31

## 2022-09-01 ENCOUNTER — Ambulatory Visit: Admit: 2022-09-01 | Discharge: 2022-09-01 | Payer: MEDICARE

## 2022-09-01 ENCOUNTER — Other Ambulatory Visit: Admit: 2022-09-01 | Discharge: 2022-09-01 | Payer: MEDICARE

## 2022-09-01 DIAGNOSIS — E878 Other disorders of electrolyte and fluid balance, not elsewhere classified: Principal | ICD-10-CM

## 2022-09-01 DIAGNOSIS — C801 Malignant (primary) neoplasm, unspecified: Principal | ICD-10-CM

## 2022-09-01 DIAGNOSIS — D8989 Other specified disorders involving the immune mechanism, not elsewhere classified: Principal | ICD-10-CM

## 2022-09-01 DIAGNOSIS — R799 Abnormal finding of blood chemistry, unspecified: Principal | ICD-10-CM

## 2022-09-01 DIAGNOSIS — C549 Malignant neoplasm of corpus uteri, unspecified: Principal | ICD-10-CM

## 2022-09-01 DIAGNOSIS — R899 Unspecified abnormal finding in specimens from other organs, systems and tissues: Principal | ICD-10-CM

## 2022-09-07 ENCOUNTER — Other Ambulatory Visit: Admit: 2022-09-07 | Discharge: 2022-09-07 | Payer: MEDICARE

## 2022-09-07 ENCOUNTER — Ambulatory Visit
Admit: 2022-09-07 | Discharge: 2022-09-07 | Payer: MEDICARE | Attending: Student in an Organized Health Care Education/Training Program | Primary: Student in an Organized Health Care Education/Training Program

## 2022-09-07 ENCOUNTER — Encounter
Admit: 2022-09-07 | Discharge: 2022-09-07 | Payer: MEDICARE | Attending: Student in an Organized Health Care Education/Training Program | Primary: Student in an Organized Health Care Education/Training Program

## 2022-09-07 ENCOUNTER — Ambulatory Visit: Admit: 2022-09-07 | Discharge: 2022-09-07 | Payer: MEDICARE

## 2022-09-07 DIAGNOSIS — C549 Malignant neoplasm of corpus uteri, unspecified: Principal | ICD-10-CM

## 2022-09-07 DIAGNOSIS — I8290 Acute embolism and thrombosis of unspecified vein: Principal | ICD-10-CM

## 2022-09-07 DIAGNOSIS — K219 Gastro-esophageal reflux disease without esophagitis: Principal | ICD-10-CM

## 2022-09-07 DIAGNOSIS — F419 Anxiety disorder, unspecified: Principal | ICD-10-CM

## 2022-09-07 DIAGNOSIS — T451X5A Adverse effect of antineoplastic and immunosuppressive drugs, initial encounter: Principal | ICD-10-CM

## 2022-09-07 DIAGNOSIS — D6481 Anemia due to antineoplastic chemotherapy: Principal | ICD-10-CM

## 2022-09-07 DIAGNOSIS — C55 Malignant neoplasm of uterus, part unspecified: Principal | ICD-10-CM

## 2022-09-07 DIAGNOSIS — I10 Essential (primary) hypertension: Principal | ICD-10-CM

## 2022-09-07 DIAGNOSIS — C801 Malignant (primary) neoplasm, unspecified: Principal | ICD-10-CM

## 2022-09-07 DIAGNOSIS — Z01818 Encounter for other preprocedural examination: Principal | ICD-10-CM

## 2022-09-07 MED ORDER — LIDOCAINE HCL 4 % TOPICAL CREAM
Freq: Every day | TOPICAL | 0 refills | 0 days | Status: CP | PRN
Start: 2022-09-07 — End: 2022-09-07

## 2022-09-10 ENCOUNTER — Encounter: Admit: 2022-09-10 | Discharge: 2022-09-12 | Payer: MEDICARE

## 2022-09-10 ENCOUNTER — Encounter
Admit: 2022-09-10 | Discharge: 2022-09-12 | Disposition: A | Discharge status: Home / Self Care | Payer: MEDICARE | Attending: Critical Care Medicine

## 2022-09-10 ENCOUNTER — Ambulatory Visit: Admit: 2022-09-10 | Discharge: 2022-09-12 | Disposition: A | Discharge status: Home / Self Care | Payer: MEDICARE

## 2022-09-12 MED ORDER — OXYCODONE 5 MG TABLET
ORAL_TABLET | ORAL | 0 refills | 3 days | Status: CP | PRN
Start: 2022-09-12 — End: ?

## 2022-09-13 DIAGNOSIS — C801 Malignant (primary) neoplasm, unspecified: Principal | ICD-10-CM

## 2022-09-13 DIAGNOSIS — C549 Malignant neoplasm of corpus uteri, unspecified: Principal | ICD-10-CM

## 2022-09-14 ENCOUNTER — Ambulatory Visit: Admit: 2022-09-14 | Discharge: 2022-09-15 | Payer: PRIVATE HEALTH INSURANCE

## 2022-09-14 DIAGNOSIS — C801 Malignant (primary) neoplasm, unspecified: Principal | ICD-10-CM

## 2022-09-14 DIAGNOSIS — C549 Malignant neoplasm of corpus uteri, unspecified: Principal | ICD-10-CM

## 2022-09-22 ENCOUNTER — Ambulatory Visit: Admit: 2022-09-22 | Discharge: 2022-09-23 | Payer: MEDICARE

## 2022-09-22 ENCOUNTER — Other Ambulatory Visit: Admit: 2022-09-22 | Discharge: 2022-09-23 | Payer: MEDICARE

## 2022-09-22 DIAGNOSIS — R799 Abnormal finding of blood chemistry, unspecified: Principal | ICD-10-CM

## 2022-09-22 DIAGNOSIS — C801 Malignant (primary) neoplasm, unspecified: Principal | ICD-10-CM

## 2022-09-22 DIAGNOSIS — E878 Other disorders of electrolyte and fluid balance, not elsewhere classified: Principal | ICD-10-CM

## 2022-09-22 DIAGNOSIS — C549 Malignant neoplasm of corpus uteri, unspecified: Principal | ICD-10-CM

## 2022-09-22 DIAGNOSIS — R899 Unspecified abnormal finding in specimens from other organs, systems and tissues: Principal | ICD-10-CM

## 2022-09-22 DIAGNOSIS — D8989 Other specified disorders involving the immune mechanism, not elsewhere classified: Principal | ICD-10-CM

## 2022-09-22 MED ORDER — DULOXETINE 20 MG CAPSULE,DELAYED RELEASE
ORAL_CAPSULE | Freq: Every day | ORAL | 3 refills | 30.00000 days | Status: CP
Start: 2022-09-22 — End: 2023-01-20

## 2022-10-06 ENCOUNTER — Ambulatory Visit: Admit: 2022-10-06 | Discharge: 2022-10-07 | Payer: PRIVATE HEALTH INSURANCE

## 2022-10-06 DIAGNOSIS — R899 Unspecified abnormal finding in specimens from other organs, systems and tissues: Principal | ICD-10-CM

## 2022-10-06 DIAGNOSIS — D8989 Other specified disorders involving the immune mechanism, not elsewhere classified: Principal | ICD-10-CM

## 2022-10-06 DIAGNOSIS — R799 Abnormal finding of blood chemistry, unspecified: Principal | ICD-10-CM

## 2022-10-06 DIAGNOSIS — E878 Other disorders of electrolyte and fluid balance, not elsewhere classified: Principal | ICD-10-CM

## 2022-10-06 DIAGNOSIS — C549 Malignant neoplasm of corpus uteri, unspecified: Principal | ICD-10-CM

## 2022-10-06 DIAGNOSIS — C801 Malignant (primary) neoplasm, unspecified: Principal | ICD-10-CM

## 2022-10-13 ENCOUNTER — Ambulatory Visit: Payer: BC Managed Care – PPO | Admitting: Nurse Practitioner

## 2022-10-13 ENCOUNTER — Other Ambulatory Visit: Admit: 2022-10-13 | Discharge: 2022-10-13 | Payer: MEDICARE

## 2022-10-13 ENCOUNTER — Ambulatory Visit: Admit: 2022-10-13 | Discharge: 2022-10-13 | Payer: MEDICARE

## 2022-10-13 DIAGNOSIS — R110 Nausea: Secondary | ICD-10-CM

## 2022-10-13 DIAGNOSIS — C569 Malignant neoplasm of unspecified ovary: Secondary | ICD-10-CM

## 2022-10-13 DIAGNOSIS — R52 Pain, unspecified: Secondary | ICD-10-CM

## 2022-10-13 DIAGNOSIS — Z515 Encounter for palliative care: Secondary | ICD-10-CM

## 2022-10-13 DIAGNOSIS — R531 Weakness: Secondary | ICD-10-CM

## 2022-10-13 DIAGNOSIS — D8989 Other specified disorders involving the immune mechanism, not elsewhere classified: Principal | ICD-10-CM

## 2022-10-13 DIAGNOSIS — C801 Malignant (primary) neoplasm, unspecified: Principal | ICD-10-CM

## 2022-10-13 DIAGNOSIS — C549 Malignant neoplasm of corpus uteri, unspecified: Principal | ICD-10-CM

## 2022-10-13 DIAGNOSIS — R899 Unspecified abnormal finding in specimens from other organs, systems and tissues: Principal | ICD-10-CM

## 2022-10-13 DIAGNOSIS — E878 Other disorders of electrolyte and fluid balance, not elsewhere classified: Principal | ICD-10-CM

## 2022-10-13 DIAGNOSIS — R799 Abnormal finding of blood chemistry, unspecified: Principal | ICD-10-CM

## 2022-10-13 NOTE — Progress Notes (Signed)
[facility] Consult Note Telephone: [phone]  Fax: [phone]    Date of encounter: 10/13/22 2:32 PM PATIENT NAME: KINDLE STROHMEIER [address] [place] [address]   [phone] (home)  DOB: 29-Jan-1951 MRN: [id number] PRIMARY CARE PROVIDER:    [facility],  [place] [address] [phone]  RESPONSIBLE PARTY:    Contact Information     Name Relation Home Work Mobile   COLLIER, MONICA [phone]  [phone]   Anyla, Israelson   [phone]     Due to the COVID-19 crisis, this visit was done via telemedicine from my office and it was initiated and consent by this patient and or family.   I connected with  [name] [facility] PROXY on 07/22/22 by telephone as video not available enabled telemedicine application and verified that I am speaking with the correct person.   I discussed the limitations of evaluation and management by telemedicine. The patient expressed understanding and agreed to proceed. Palliative Care was asked to follow this patient by consultation request of  [facility] to address advance care planning and complex medical decision making. This is a follow up visit.                                  ASSESSMENT AND PLAN / RECOMMENDATIONS:  Symptom Management/Plan: ACP: full code, ongoing discussions  PC transition with PC NP to sign off with PC RN/PC SW to continue to follow.  2. 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   3. Nausea-no further nausea, continue if needed with Zofran 4 mg BID and once daily PRN, compazine PRN.    4. Generalized weakness-continues to improve; encourage times of rest.    5. Appetite: improved, weight gain, discussed nutrition, education completed. Ms. Whitehouse endorses her family does help her with meals, her husband cooks, adult children actively helping  her.    6. Neuropathy; pain-currently managed. Continue gabapentin for peripheral neuropathy stable   Follow up Palliative Care Visit: Palliative care will continue to follow for complex medical decision making, advance care planning, and clarification of goals. Return 6 weeks or prn.   I spent 26 minutes providing this consultation. More than 50% of the time in this consultation was spent in counseling and care coordination. PPS: 50% Chief Complaint: Follow up palliative consult for complex medical decision making HISTORY OF PRESENT ILLNESS:  TANDREA KOMMER is a 71 y.o. year old female  with adenocarcinoma stage IV with lesions to pubic bone and bilateral lungs. She has received palliative radiation/chemotherapy. I called Ms. Royle for telemedicine telephonic f/u visit as video not available. Message left and Ms Ellerby returned call, in agreement. We talked about how she has been feeling. Ms. Chea endorses she continues with Keytruda with no side effects as of yet and pending sgy. We talked about Oncology last appointment. We talked about ros, including specially pain, nausea, appetite which continues to be good with weight gain. We talked about her functional ability continues to improve. No recent falls, infections, hospitalizations. We talked about medical goals, quality of life, role pc in goc. Ms. Lehmkuhl endorses overall she has been doing well, no current needs, symptoms currently asymptomatic. We talked about f/u pc visit, Ms. Kauzlarich in agreement, scheduled. Therapeutic listening, emotional support provided. Questions answered.  Contact information provided. We talked about PC transition with PC NP to sign off with PC RN/PC SW to continue to follow.    Therapeutic listening, emotional support provided. I talked with Mr Tamplin separately, update on visit, no new changes. Mr. Shatz endorses Mrs. Madrazo is doing well. Questions answered.    We talked about medical goals. We talked about role pc in poc  with f/u visit, Ms. Plitt in agreement, scheduled. Therapeutic listening, emotional support provided. Questions answered.   History obtained from review of EMR, discussion with primary team, and interview with family, facility staff/caregiver and/or Ms. Chalk.  I reviewed available labs, medications, imaging, studies and related documents from the EMR.  Records reviewed and summarized above.    ROS 10 point system reviewed all negative except HPI   Physical Exam: Deferred Thank you for the opportunity to participate in the care of Ms. Mitcheltree. Please call our office at [phone] if we can be of additional assistance.   Donta Mcinroy Ihor Gully, NP

## 2022-11-03 ENCOUNTER — Ambulatory Visit: Admit: 2022-11-03 | Discharge: 2022-11-03 | Payer: MEDICARE

## 2022-11-03 ENCOUNTER — Other Ambulatory Visit: Admit: 2022-11-03 | Discharge: 2022-11-03 | Payer: MEDICARE

## 2022-11-03 DIAGNOSIS — C801 Malignant (primary) neoplasm, unspecified: Principal | ICD-10-CM

## 2022-11-03 DIAGNOSIS — C549 Malignant neoplasm of corpus uteri, unspecified: Principal | ICD-10-CM

## 2022-11-03 DIAGNOSIS — R899 Unspecified abnormal finding in specimens from other organs, systems and tissues: Principal | ICD-10-CM

## 2022-11-03 DIAGNOSIS — R799 Abnormal finding of blood chemistry, unspecified: Principal | ICD-10-CM

## 2022-11-03 DIAGNOSIS — D649 Anemia, unspecified: Principal | ICD-10-CM

## 2022-11-03 DIAGNOSIS — D8989 Other specified disorders involving the immune mechanism, not elsewhere classified: Principal | ICD-10-CM

## 2022-11-03 MED ORDER — GABAPENTIN 300 MG CAPSULE
ORAL_CAPSULE | 2 refills | 0 days | Status: CP
Start: 2022-11-03 — End: ?

## 2022-11-03 MED ORDER — LIDOCAINE-PRILOCAINE 2.5 %-2.5 % TOPICAL CREAM
Freq: Every day | TOPICAL | 2 refills | 0 days | Status: CP | PRN
Start: 2022-11-03 — End: 2023-11-03

## 2022-11-24 ENCOUNTER — Ambulatory Visit: Admit: 2022-11-24 | Discharge: 2022-11-25 | Payer: MEDICARE

## 2022-11-24 ENCOUNTER — Other Ambulatory Visit: Admit: 2022-11-24 | Discharge: 2022-11-25 | Payer: MEDICARE

## 2022-11-24 DIAGNOSIS — C801 Malignant (primary) neoplasm, unspecified: Principal | ICD-10-CM

## 2022-11-24 DIAGNOSIS — R799 Abnormal finding of blood chemistry, unspecified: Principal | ICD-10-CM

## 2022-11-24 DIAGNOSIS — D8989 Other specified disorders involving the immune mechanism, not elsewhere classified: Principal | ICD-10-CM

## 2022-11-24 DIAGNOSIS — R899 Unspecified abnormal finding in specimens from other organs, systems and tissues: Principal | ICD-10-CM

## 2022-11-24 DIAGNOSIS — C549 Malignant neoplasm of corpus uteri, unspecified: Principal | ICD-10-CM

## 2022-11-24 MED ORDER — DULOXETINE 20 MG CAPSULE,DELAYED RELEASE
ORAL_CAPSULE | Freq: Every day | ORAL | 3 refills | 30 days | Status: CP
Start: 2022-11-24 — End: 2023-03-24

## 2022-11-26 ENCOUNTER — Ambulatory Visit: Admit: 2022-11-26 | Discharge: 2022-11-26 | Payer: MEDICARE

## 2022-11-26 DIAGNOSIS — C549 Malignant neoplasm of corpus uteri, unspecified: Principal | ICD-10-CM

## 2022-11-26 MED ORDER — LENVATINIB 20 MG/DAY (10 MG X 2) CAPSULE
ORAL_CAPSULE | Freq: Every day | ORAL | 5 refills | 0 days | Status: CP
Start: 2022-11-26 — End: ?
  Filled 2022-12-20: qty 60, 30d supply, fill #0

## 2022-12-01 DIAGNOSIS — C549 Malignant neoplasm of corpus uteri, unspecified: Principal | ICD-10-CM

## 2022-12-13 ENCOUNTER — Telehealth: Payer: Self-pay

## 2022-12-13 NOTE — Telephone Encounter (Signed)
Palliative care SW LVM for patient to complete telephonic check in and schedule next PC home visit.

## 2022-12-15 ENCOUNTER — Ambulatory Visit: Admit: 2022-12-15 | Discharge: 2022-12-15 | Payer: MEDICARE

## 2022-12-15 ENCOUNTER — Other Ambulatory Visit: Admit: 2022-12-15 | Discharge: 2022-12-15 | Payer: MEDICARE

## 2022-12-15 DIAGNOSIS — C549 Malignant neoplasm of corpus uteri, unspecified: Principal | ICD-10-CM

## 2022-12-15 DIAGNOSIS — C801 Malignant (primary) neoplasm, unspecified: Principal | ICD-10-CM

## 2022-12-15 DIAGNOSIS — D8989 Other specified disorders involving the immune mechanism, not elsewhere classified: Principal | ICD-10-CM

## 2022-12-15 DIAGNOSIS — R799 Abnormal finding of blood chemistry, unspecified: Principal | ICD-10-CM

## 2022-12-15 DIAGNOSIS — R899 Unspecified abnormal finding in specimens from other organs, systems and tissues: Principal | ICD-10-CM

## 2022-12-15 MED ORDER — ONDANSETRON 4 MG DISINTEGRATING TABLET
ORAL_TABLET | Freq: Three times a day (TID) | ORAL | 2 refills | 10 days | Status: CP | PRN
Start: 2022-12-15 — End: ?

## 2022-12-16 ENCOUNTER — Encounter: Admit: 2022-12-16 | Discharge: 2022-12-16 | Payer: MEDICARE

## 2022-12-16 DIAGNOSIS — C801 Malignant (primary) neoplasm, unspecified: Principal | ICD-10-CM

## 2022-12-16 DIAGNOSIS — C549 Malignant neoplasm of corpus uteri, unspecified: Principal | ICD-10-CM

## 2022-12-17 NOTE — Unmapped (Signed)
[facility]   Patient Onboarding/Medication Counseling        Robin Weber is a 72 y.o. female with endometrial cancer who I am counseling today on initiation of therapy.  I am speaking to the patient.    Was a Nurse, learning disability used for this call? No    Verified patient's date of birth / HIPAA.    Specialty medication(s) to be sent: Hematology/Oncology: [name]      Non-specialty medications/supplies to be sent: n/a      Medications not needed at this time: n/a         Lenvima (Lenvatinib)    Medication & Administration     Dosage:   Take 20mg  (two 10mg  capsules) daily  Comes in blister card packaging.    Administration:   Comes in a blister card packing. It is important to take exact dose as prescribed.    Administer orally at the same time each day.   May be administered with or without food.  Capsules may be swallowed whole or dissolved in a small glass of liquid. To dissolve in liquid, measure 15 mL of water or apple juice into a glass; add whole capsule (do not break or crush capsule) and leave in liquid for at least 10 minutes, then stir for at least 3 minutes. Administer liquid, then add 15 mL of additional water or apple juice to glass, swirl a few times and then swallow additional liquid.    Adherence/Missed dose instructions: If you miss a dose, take it as soon as you remember.  If the next dose is scheduled within 12 hours, skip the missed dose and take the next dose at its next regularly scheduled time. Do not take an extra dose to make up for a missed dose.    Goals of Therapy     Prevent disease progression    Side Effects & Monitoring Parameters     Nausea/Vomiting  Diarrhea  Fatigue  Cough   High Blood Pressure  Hand Foot Syndrome  Increase bleeding risk  Decreased appetite and changes in taste  Dry mouth, mouth irritation, mouth pain, mouth sores  Joint/muscle aches  Restlessness, difficulty sleeping  Impaired wound healing  Heartburn  Hair loss   Headache  Protein in the urine  Voice changes     The following side effects should be reported to the provider:  Heartbeat that doesn't feel normal (heart feels like it's racing, skipping a beat or fluttering)  Hypertension  Dark urine or yellow skin or eyes  Signs of infection (fever, chills, cough)  Excessive bruising, gums bleeding or nose bleeds  Mouth irritation or mouth sores  Redness or irritation on the palms of the hands or soles of the feet  Swelling, warmth, numbness, change in color or pain in a leg or arm  Signs of low thyroid (constipation, trouble handling heat or cold, memory problems, mood changes, or burning, numbness or tingling feeling).  Severe or persistent diarrhea  Signs of posterior reversible encephalopathy syndrome (confusion, not alert, vision changes, seizures, or severe headache)  Signs of cerebrovascular disease (change in strength on one side is greater than the other, trouble speaking or thinking, change in balance or vision changes)  Signs of fluid and electrolyte problems (mood changes, confusion, muscle pain or weakness, abnormal/fast heartbeat, severe dizziness, passing out)  Signs of kidney problems (inability to pass urine, blood in the urine, change in amount of urine passed, weight gain)  Signs of UTI (blood in the urine,  burning or painful urination, passing a lot of urine, fever, lower abdominal or pelvic pain)  Signs of anaphylaxis (wheezing, chest tightness, swelling of face, lips, tongue or throat)    Monitoring Parameters:  Monitor for Qtc prolongation  Blood pressure  Liver function tests at baseline and during treatment  Renal function  Electrolytes  Serum calcium  TSH  Thyroid function at baseline and monthly  Proteinuria baseline and during treatment  Pregnancy test in females of reproductive potential      Contraindications, Warnings, & Precautions     Hand-foot syndrome- counseled patient to use moisturizer on hands/feet, use lukewarm water especially when washing hands and pat them dry instead of rubbing them   Mouth sores- discussed use of baking soda/salt water rinses   Cardiac effects : hypertension  GI perforation  Hemorrhage  Hepatotoxicity  Hypocalcemia  Hypothyroidism  Reversible posterior leukocencephalopathy syndrome  Thromboembolic events  Wound healing impairment  Exposure of an unborn child to this medication could cause birth defects, so you should not become pregnant or father a child while on this medication.  Effective birth control is necessary during treatment and for at least 30 days after treatment.    Drug/Food Interactions     Medication list reviewed in Epic. The patient was instructed to inform the care team before taking any new medications or supplements including over the counter medications, vitamins, and herbal supplements. No drug interactions identified.     Storage, Handling Precautions, & Disposal     This medication should be stored at room temperature and in a dry location. Keep out of reach of others including children and pets. Keep the medicine in the original blister packs (no pillboxes).   Do not throw away or flush unused mediation down the toilet or sink. This drug is considered hazardous and should be handled as little as possible.  If someone else helps with medication administration, they should wear gloves      Current Medications (including OTC/herbals), Comorbidities and Allergies     Current Outpatient Medications   Medication Sig Dispense Refill    apixaban (ELIQUIS) 5 mg Tab Take 1 tablet (5 mg total) by mouth Two (2) times a day. 60 tablet 5    DULoxetine (CYMBALTA) 20 MG capsule Take 1 capsule (20 mg total) by mouth daily. 30 capsule 3    gabapentin (NEURONTIN) 300 MG capsule Please take 1 capsule (300 mg) in the morning and 2 capsules (600 mg) in the evening 90 capsule 2    lenvatinib 20 mg/day (10 mg x 2) cap Take 2 capsules (20 mg) by mouth daily. 60 capsule 5    lidocaine-prilocaine (EMLA) 2.5-2.5 % cream Apply topically daily as needed. Prior to port access 30 g 2    NON FORMULARY Nature Made Iron Gummy - two gummies once a day      NON FORMULARY Nature Made Vitamin B12 Gummy - two gummies once a day      ondansetron (ZOFRAN-ODT) 4 MG disintegrating tablet Take 1 tablet (4 mg total) by mouth every eight (8) hours as needed for nausea. 30 tablet 2    polyethylene glycol (MIRALAX) 17 gram packet Take 17 g by mouth Two (2) times a day. (Patient taking differently: Take 17 g by mouth daily.)      senna (SENOKOT) 8.6 mg tablet Take 2 tablets by mouth daily. 60 tablet 5    timolo/brimon/dorzo/latanop/PF (TIMOL-BRIMON-DORZO-LATANOP,PF,) 0.5 %-0.15 %- 2 %-0.005 % Drop Apply 1 drop to eye two (2) times  a day.       No current facility-administered medications for this visit.       Allergies   Allergen Reactions    Banana Nausea Only    Meloxicam Nausea Only       Patient Active Problem List   Diagnosis    Essential hypertension    GERD (gastroesophageal reflux disease)    Lung nodule    Weight loss    Adenocarcinoma (CMS-HCC)    Cancer related pain    Glaucoma    Osteoarthritis of cervical spine    Lytic bone lesions on xray    Leukopenia    Electrolyte abnormality    Anemia associated with chemotherapy    Chemotherapy-induced peripheral neuropathy (CMS-HCC)    Abnormal laboratory test result    Other specified disorders involving the immune mechanism, not elsewhere classified (CMS-HCC)    Malignant neoplasm of corpus uteri, unspecified (CMS-HCC)    Abnormal finding of blood chemistry, unspecified       Reviewed and up to date in Epic.    Appropriateness of Therapy     Acute infections noted within Epic:  No active infections  Patient reported infection: None    Is medication and dose appropriate based on diagnosis and infection status? Yes    Prescription has been clinically reviewed: Yes      Baseline Quality of Life Assessment      How many days over the past month did your condition  keep you from your normal activities? For example, brushing your teeth or getting up in the morning. 0    Financial Information     Medication Assistance provided: Prior Authorization and Copay Assistance    Anticipated copay of $0 reviewed with patient. Verified delivery address.    Delivery Information     Scheduled delivery date: 12/20/22    Expected start date: 12/20/22    Medication will be delivered via Same Day Courier to the prescription address in [facility].  This shipment will not require a signature.      Explained the services we provide at [facility] and that each month we would call to set up refills.  Stressed importance of returning phone calls so that we could ensure they receive their medications in time each month.  Informed patient that we should be setting up refills 7-10 days prior to when they will run out of medication.  A pharmacist will reach out to perform a clinical assessment periodically.  Informed patient that a welcome packet, containing information about our pharmacy and other support services, a Notice of Privacy Practices, and a drug information handout will be sent.      The patient or caregiver noted above participated in the development of this care plan and knows that they can request review of or adjustments to the care plan at any time.      Patient or caregiver verbalized understanding of the above information as well as how to contact the pharmacy at [phone] option 4 with any questions/concerns.  The pharmacy is open Monday through Friday 8:30am-4:30pm.  A pharmacist is available 24/7 via pager to answer any clinical questions they may have.    Patient Specific Needs     Does the patient have any physical, cognitive, or cultural barriers? No    Does the patient have adequate living arrangements? (i.e. the ability to store and take their medication appropriately) Yes    Did you identify any home environmental safety or security  hazards? No    Patient prefers to have medications discussed with  Patient     Is the patient or caregiver able to read and understand education materials at a high school level or above? Yes    Patient's primary language is  English     Is the patient high risk? Yes, patient is taking oral chemotherapy. Appropriateness of therapy as been assessed    SOCIAL DETERMINANTS OF HEALTH     At the [facility], we have learned that life circumstances - like trouble affording food, housing, utilities, or transportation can affect the health of many of our patients.   That is why we wanted to ask: are you currently experiencing any life circumstances that are negatively impacting your health and/or quality of life? No    Social Determinants of Health     Financial Resource Strain: Low Risk  (10/27/2021)    Overall Financial Resource Strain (CARDIA)     Difficulty of Paying Living Expenses: Not hard at all   Internet Connectivity: Not on file   Food Insecurity: No Food Insecurity (10/27/2021)    Hunger Vital Sign     Worried About Running Out of Food in the Last Year: Never true     Ran Out of Food in the Last Year: Never true   Tobacco Use: Low Risk  (12/15/2022)    Patient History     Smoking Tobacco Use: Never     Smokeless Tobacco Use: Never     Passive Exposure: Not on file   Housing/Utilities: Low Risk  (10/27/2021)    Housing/Utilities     Within the past 12 months, have you ever stayed: outside, in a car, in a tent, in an overnight shelter, or temporarily in someone else's home (i.e. couch-surfing)?: No     Are you worried about losing your housing?: No     Within the past 12 months, have you been unable to get utilities (heat, electricity) when it was really needed?: No   Alcohol Use: Not on file   Transportation Needs: No Transportation Needs (10/27/2021)    PRAPARE - Therapist, art (Medical): No     Lack of Transportation (Non-Medical): No   Substance Use: Not on file   Health Literacy: Not on file   Physical Activity: Not on file   Interpersonal Safety: Not on file   Stress: Not on file   Intimate Partner Violence: Not on file   Depression: Not at risk (11/24/2022)    PHQ-2     PHQ-2 Score: 0   Social Connections: Not on file       Would you be willing to receive help with any of the needs that you have identified today? Not applicable       Rollen Sox, [facility]  [facility] Specialty Pharmacist

## 2022-12-17 NOTE — Unmapped (Signed)
[facility] Specialty Medication Onboarding    Specialty Medication: Lenvima 20 mg/day capsules  Prior Authorization: Approved   Financial Assistance: Yes - copay card approved as secondary   Final Copay/Day Supply: $0 / 30    Insurance Restrictions:     Notes to Pharmacist:     The triage team has completed the benefits investigation and has determined that the patient is able to fill this medication at [facility]. Please contact the patient to complete the onboarding or follow up with the prescribing physician as needed.

## 2022-12-20 DIAGNOSIS — K0889 Other specified disorders of teeth and supporting structures: Principal | ICD-10-CM

## 2022-12-20 MED ORDER — AMOXICILLIN 875 MG-POTASSIUM CLAVULANATE 125 MG TABLET
ORAL_TABLET | Freq: Two times a day (BID) | ORAL | 0 refills | 5 days | Status: CP
Start: 2022-12-20 — End: 2022-12-25

## 2022-12-21 NOTE — Unmapped (Signed)
Specialty Medication Follow-up    Robin Weber is a 72 y.o. female with recurrent endometrial cancer who I am seeing for follow up on their treatment with lenvatinib + pembrolizumab.     Chemotherapy: Lenvatinib 20 mg PO daily (not yet started) + Pembrolizumab 200 mg IV q3wk  Start date: Cycle 1 Day 1 (12/15/22)    A/P:   1. Oral Chemotherapy: CBC w/diff and CMP reviewed. Grade 1 anemia which is stable. Grade 1 serum creatinine elevation which has worsened. Patient meets treatment parameters to start lenvatinib therapy. Patient will start lenvatinib following visit with dentist to ensure no dental work is necessary while on lenvatinib.   HOLD lenvatinib 20 mg PO daily until dental evaluation      I spent approximately 15 minutes in direct patient care.    Next follow up: Pending receipt of lenvatinib    Referring physician: Dr. Valeda Malm, PharmD, BCOP, CPP  Gynecologic Oncology Clinic Pharmacist  Pager: [phone]    S/O: Robin Weber presents to clinic prior to immunotherapy. She reports that she has not yet received lenvatinib therapy. 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 She denies rash, diarrhea, or SOB.     Medications reviewed and updated in EPIC? no    Missed doses: N/A (not yet started)    Labs  Lab on 12/15/2022   Component Date Value Ref Range Status    CA 125 12/15/2022 71 (H)  0 - 35 U/mL Final    Sodium 12/15/2022 144  135 - 145 mmol/L Final    Potassium 12/15/2022 4.5  3.5 - 5.1 mmol/L Final    Chloride 12/15/2022 112 (H)  98 - 107 mmol/L Final    CO2 12/15/2022 27.0  20.0 - 31.0 mmol/L Final    Anion Gap 12/15/2022 5  5 - 14 mmol/L Final    BUN 12/15/2022 21  9 - 23 mg/dL Final    Creatinine 84/13/2440 1.05 (H)  0.55 - 1.02 mg/dL Final    BUN/Creatinine Ratio 12/15/2022 20   Final    eGFR CKD-EPI (2021) Female 12/15/2022 58 (L)  >=60 mL/min/1.81m2 Final    eGFR calculated with CKD-EPI 2021 equation in accordance with [employer] and [employer] of Nephrology Task Force recommendations.    Glucose 12/15/2022 99  70 - 179 mg/dL Final    Calcium 09/18/2535 9.7  8.7 - 10.4 mg/dL Final    Albumin 64/40/3474 3.5  3.4 - 5.0 g/dL Final    Total Protein 12/15/2022 6.6  5.7 - 8.2 g/dL Final    Total Bilirubin 12/15/2022 0.3  0.3 - 1.2 mg/dL Final    AST 25/95/6387 25  <=34 U/L Final    ALT 12/15/2022 21  10 - 49 U/L Final    Alkaline Phosphatase 12/15/2022 73  46 - 116 U/L Final    Phosphorus 12/15/2022 3.6  2.4 - 5.1 mg/dL Final    WBC 56/43/3295 6.0  3.6 - 11.2 10*9/L Final    RBC 12/15/2022 3.25 (L)  3.95 - 5.13 10*12/L Final    HGB 12/15/2022 10.4 (L)  11.3 - 14.9 g/dL Final    HCT 18/84/1660 30.8 (L)  34.0 - 44.0 % Final    MCV 12/15/2022 94.7  77.6 - 95.7 fL Final    MCH 12/15/2022 31.9  25.9 - 32.4 pg Final    MCHC 12/15/2022 33.7  32.0 - 36.0 g/dL Final    RDW 63/11/6008 15.3 (H)  12.2 - 15.2 % Final    MPV 12/15/2022 6.8  6.8 - 10.7 fL Final    Platelet 12/15/2022 215  150 - 450 10*9/L Final    Neutrophils % 12/15/2022 72.6  % Final    Lymphocytes % 12/15/2022 14.3  % Final    Monocytes % 12/15/2022 8.5  % Final    Eosinophils % 12/15/2022 4.3  % Final    Basophils % 12/15/2022 0.3  % Final    Absolute Neutrophils 12/15/2022 4.3  1.8 - 7.8 10*9/L Final    Absolute Lymphocytes 12/15/2022 0.9 (L)  1.1 - 3.6 10*9/L Final    Absolute Monocytes 12/15/2022 0.5  0.3 - 0.8 10*9/L Final    Absolute Eosinophils 12/15/2022 0.3  0.0 - 0.5 10*9/L Final    Absolute Basophils 12/15/2022 0.0  0.0 - 0.1 10*9/L Final

## 2022-12-24 NOTE — Unmapped (Addendum)
Outgoing call to [facility] office (2/1).  I spoke to dentist (Dr. Allie Dimmer), who is requesting dental clearance for upcoming procedure. Dentist reported that pt has tooth abscess and is recommending a dental procedure/ may require several procedures.  Per Dr. Chestine Spore, pt received dental clearance and it is ok to proceed with all  dental procedures, as needed. Antibiotic RX sent to local pharmacy (Augmentin 875 mg). [facility] to contact office with any further questions/concerns.

## 2023-01-05 ENCOUNTER — Ambulatory Visit: Admit: 2023-01-05 | Discharge: 2023-01-06 | Payer: MEDICARE

## 2023-01-05 ENCOUNTER — Other Ambulatory Visit: Admit: 2023-01-05 | Discharge: 2023-01-06 | Payer: MEDICARE

## 2023-01-05 DIAGNOSIS — R899 Unspecified abnormal finding in specimens from other organs, systems and tissues: Principal | ICD-10-CM

## 2023-01-05 DIAGNOSIS — D8989 Other specified disorders involving the immune mechanism, not elsewhere classified: Principal | ICD-10-CM

## 2023-01-05 DIAGNOSIS — C549 Malignant neoplasm of corpus uteri, unspecified: Principal | ICD-10-CM

## 2023-01-05 DIAGNOSIS — R799 Abnormal finding of blood chemistry, unspecified: Principal | ICD-10-CM

## 2023-01-05 DIAGNOSIS — C801 Malignant (primary) neoplasm, unspecified: Principal | ICD-10-CM

## 2023-01-05 LAB — CBC W/ AUTO DIFF
BASOPHILS ABSOLUTE COUNT: 0 10*9/L (ref 0.0–0.1)
BASOPHILS RELATIVE PERCENT: 0.3 %
EOSINOPHILS ABSOLUTE COUNT: 0.2 10*9/L (ref 0.0–0.5)
EOSINOPHILS RELATIVE PERCENT: 3.8 %
HEMATOCRIT: 30.2 % — ABNORMAL LOW (ref 34.0–44.0)
HEMOGLOBIN: 10.2 g/dL — ABNORMAL LOW (ref 11.3–14.9)
LYMPHOCYTES ABSOLUTE COUNT: 1.1 10*9/L (ref 1.1–3.6)
LYMPHOCYTES RELATIVE PERCENT: 16.7 %
MEAN CORPUSCULAR HEMOGLOBIN CONC: 33.6 g/dL (ref 32.0–36.0)
MEAN CORPUSCULAR HEMOGLOBIN: 31.3 pg (ref 25.9–32.4)
MEAN CORPUSCULAR VOLUME: 93.2 fL (ref 77.6–95.7)
MEAN PLATELET VOLUME: 7.5 fL (ref 6.8–10.7)
MONOCYTES ABSOLUTE COUNT: 0.5 10*9/L (ref 0.3–0.8)
MONOCYTES RELATIVE PERCENT: 7.5 %
NEUTROPHILS ABSOLUTE COUNT: 4.6 10*9/L (ref 1.8–7.8)
NEUTROPHILS RELATIVE PERCENT: 71.7 %
PLATELET COUNT: 243 10*9/L (ref 150–450)
RED BLOOD CELL COUNT: 3.25 10*12/L — ABNORMAL LOW (ref 3.95–5.13)
RED CELL DISTRIBUTION WIDTH: 15.3 % — ABNORMAL HIGH (ref 12.2–15.2)
WBC ADJUSTED: 6.4 10*9/L (ref 3.6–11.2)

## 2023-01-05 LAB — COMPREHENSIVE METABOLIC PANEL
ALBUMIN: 3.6 g/dL (ref 3.4–5.0)
ALKALINE PHOSPHATASE: 73 U/L (ref 46–116)
ALT (SGPT): 15 U/L (ref 10–49)
ANION GAP: 7 mmol/L (ref 5–14)
AST (SGOT): 25 U/L (ref <=34)
BILIRUBIN TOTAL: 0.3 mg/dL (ref 0.3–1.2)
BLOOD UREA NITROGEN: 20 mg/dL (ref 9–23)
BUN / CREAT RATIO: 17
CALCIUM: 9.3 mg/dL (ref 8.7–10.4)
CHLORIDE: 116 mmol/L — ABNORMAL HIGH (ref 98–107)
CO2: 21 mmol/L (ref 20.0–31.0)
CREATININE: 1.17 mg/dL — ABNORMAL HIGH
EGFR CKD-EPI (2021) FEMALE: 51 mL/min/{1.73_m2} — ABNORMAL LOW (ref >=60)
GLUCOSE RANDOM: 103 mg/dL (ref 70–179)
POTASSIUM: 3.7 mmol/L (ref 3.5–5.1)
PROTEIN TOTAL: 6.5 g/dL (ref 5.7–8.2)
SODIUM: 144 mmol/L (ref 135–145)

## 2023-01-05 LAB — T4, FREE: FREE T4: 0.87 ng/dL — ABNORMAL LOW (ref 0.89–1.76)

## 2023-01-05 LAB — TSH: THYROID STIMULATING HORMONE: 2.559 u[IU]/mL (ref 0.550–4.780)

## 2023-01-05 LAB — CA 125: CA 125: 74 U/mL — ABNORMAL HIGH (ref 0–35)

## 2023-01-05 LAB — PHOSPHORUS: PHOSPHORUS: 3.5 mg/dL (ref 2.4–5.1)

## 2023-01-05 MED ADMIN — sodium chloride (NS) 0.9 % infusion: 100 mL/h | INTRAVENOUS | @ 16:00:00

## 2023-01-05 MED ADMIN — heparin, porcine (PF) 100 unit/mL injection 500 Units: 500 [IU] | INTRAVENOUS | @ 17:00:00 | Stop: 2023-01-06

## 2023-01-05 MED ADMIN — pembrolizumab (KEYTRUDA) 200 mg in sodium chloride (NS) 0.9 % 50 mL IVPB: 200 mg | INTRAVENOUS | @ 16:00:00 | Stop: 2023-01-05

## 2023-01-05 MED ADMIN — zoledronic acid (ZOMETA) 3.3 mg in sodium chloride (NS) 0.9 % 100 mL IVPB: 3.3 mg | INTRAVENOUS | @ 16:00:00 | Stop: 2023-01-05

## 2023-01-05 NOTE — Unmapped (Signed)
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 Following cycle#1 of carbo/taxol on 11/18/21 she was admitted on 11/20/2021 for dysuria and left groin pain with the etiology due to a destructive osseous lytic lesion in her left inferior pubic ramus. Had palliative radiation to the bony lesion completed on 1/10. Received cycle#2 on 12/09/21 prior to discharge to SNF. Her course was complicated by urinary retention due to tumor burden with failed voiding trials.      Adenocarcinoma (CMS-HCC)   11/04/2021 Initial Diagnosis    Adenocarcinoma (CMS-HCC)     11/18/2021 - 06/09/2022 Chemotherapy    OP GYN PACLITAXEL/CARBOPLATIN  PACLitaxel 175 mg/m2 IV on day 1, CARBOplatin AUC6 IV on day 1, every 21 days     07/02/2022 -  Chemotherapy    OP PEMBROLIZUMAB 200 MG Q3W  Pembrolizumab 200 mg IV on Day 1  21-day cycle     Malignant neoplasm of corpus uteri, unspecified (CMS-HCC)   06/29/2022 Initial Diagnosis    Malignant neoplasm of corpus uteri, unspecified (CMS-HCC)     07/02/2022 -  Chemotherapy    OP PEMBROLIZUMAB 200 MG Q3W  Pembrolizumab 200 mg IV on Day 1  21-day cycle     09/10/2022 Surgery    Procedure on 09/10/22:  Robotic-assisted total laparoscopic hysterectomy, left salpingo-oophorectomy, appendectomy      Operative Findings:  Bimanual exam without palpable adnexal masses, small mobile uterus. Normal appearing cervix without lesions. Upper abdominal survey normal. Mild intraperitoneal adhesive disease, including a thick adhesion of the transverse colon to the anterior abdominal wall and adhesion of the appendix to the right uterine cornua necessitating appendectomy. No evidence of residual intraperitoneal disease. 09/22/2022 -  Cancer Staged    Cancer Staging   No matching staging information was found for the patient.       09/22/2022 Tumor Board    FIGO Stage: IVB endometrial endometrioid adenocarcinoma, FIGO grade 2, extensive +LVSI, MMRi, p53 WT; s/p NACT followed by 3 cycles single-agent pembrolizumab and interval palliative robotic hysterectomy.  Plan:   - Continue maintenance pembrolizumab.  - Obtain ER/PR staining.     Sources:   Eskander et al. (2023) Pembrolizumab plus chemotherapy in advanced endometrial cancer (ZO109). NEJM.      09/22/2022 -  Cancer Staged    Staging form: Corpus Uteri - Carcinoma and Carcinosarcoma, AJCC 8th Edition  - Clinical stage from 09/22/2022: FIGO Stage IVB - Signed by Bary Leriche, MD on 09/23/2022       12/07/2022 -  Other    Foundation1:  AKT1 (Pathogenic)  CTNNB1 (Pathogenic)         INTERVAL HISTORY:  Had dental work. Healing but still sore. Unable to start lenvima due to dental work. Has pills at home.     PAST MEDICAL/SURGICAL HX:  Past Medical History:   Diagnosis Date    GERD (gastroesophageal reflux disease)     HTN (hypertension)     Osteoarthritis     Pelvic mass        Past Surgical History:   Procedure Laterality Date    CESAREAN SECTION  1988    IR INSERT PORT AGE GREATER THAN 5 YRS  11/12/2021    IR INSERT  PORT AGE GREATER THAN 5 YRS 11/12/2021 Ammie Dalton, MD IMG VIR [facility]    PR APPENDECTOMY,W OTHR PROC N/A 09/10/2022    Procedure: APPENDECTOMY; WHEN DONE FOR INDICATED PURPOSE @ TIME OF OTHER MAJOR PROCEDURE;  Surgeon: Randa Ngo, MD;  Location: MAIN OR [facility];  Service: Gynecology Oncology    PR BIOPSY OF VAGINA,SIMPLE N/A 10/27/2021    Procedure: BIOPSY OF VAGINAL MUCOSA; SIMPLE (SEPARATE PROCEDURE);  Surgeon: Gaynelle Cage, MD;  Location: [facility] OR [facility];  Service: Family Planning    PR LAPAROSCOPY W TOT HYSTERECTUTERUS <=250 [address] Filler TUBE/OVARY Bilateral 09/10/2022    Procedure: ROBOTIC TOTAL HYSTERECTOMY WITH LEFT SALPINGOOPHORECTOMY, RIGHT TUBE AND OVARY ABSENT;  Surgeon: Randa Ngo, MD;  Location: MAIN OR [facility];  Service: Gynecology Oncology       Family History   Problem Relation Age of Onset    Anesthesia problems Neg Hx     Bleeding Disorder Neg Hx        Social History     Socioeconomic History    Marital status: Married     Spouse name: None    Number of children: None    Years of education: None    Highest education level: None   Tobacco Use    Smoking status: Never    Smokeless tobacco: Never   Vaping Use    Vaping status: Never Used   Substance and Sexual Activity    Alcohol use: Not Currently    Drug use: Never     Social Determinants of Health     Financial Resource Strain: Low Risk  (10/27/2021)    Overall Financial Resource Strain (CARDIA)     Difficulty of Paying Living Expenses: Not hard at all   Food Insecurity: No Food Insecurity (10/27/2021)    Hunger Vital Sign     Worried About Running Out of Food in the Last Year: Never true     Ran Out of Food in the Last Year: Never true   Transportation Needs: No Transportation Needs (10/27/2021)    PRAPARE - Therapist, art (Medical): No     Lack of Transportation (Non-Medical): No       CURRENT MEDICATIONS:    Current Outpatient Medications:     amoxicillin (AMOXIL) 875 MG tablet, Take 1 tablet (875 mg total) by mouth two (2) times a day., Disp: , Rfl:     apixaban (ELIQUIS) 5 mg Tab, Take 1 tablet (5 mg total) by mouth Two (2) times a day., Disp: 60 tablet, Rfl: 5    chlorhexidine (PERIDEX) 0.12 % solution, 15 mL by Oromucosal route two (2) times a day., Disp: , Rfl:     diazePAM (VALIUM) 5 MG tablet, Take 1 tablet (5 mg total) by mouth nightly as needed for anxiety., Disp: , Rfl:     gabapentin (NEURONTIN) 300 MG capsule, Please take 1 capsule (300 mg) in the morning and 2 capsules (600 mg) in the evening, Disp: 90 capsule, Rfl: 2    latanoprost (XALATAN) 0.005 % ophthalmic solution, Administer 1 drop to both eyes nightly., Disp: , Rfl:     lenvatinib 20 mg/day (10 mg x 2) cap, Take 2 capsules (20 mg) by mouth daily., Disp: 60 capsule, Rfl: 5    lidocaine-prilocaine (EMLA) 2.5-2.5 % cream, Apply topically daily as needed. Prior to port access, Disp: 30 g, Rfl: 2    NON FORMULARY, Nature Made Iron Gummy - two gummies once a day, Disp: ,  Rfl:     NON FORMULARY, Nature Made Vitamin B12 Gummy - two gummies once a day, Disp: , Rfl:     ondansetron (ZOFRAN-ODT) 4 MG disintegrating tablet, Take 1 tablet (4 mg total) by mouth every eight (8) hours as needed for nausea., Disp: 30 tablet, Rfl: 2    polyethylene glycol (MIRALAX) 17 gram packet, Take 17 g by mouth Two (2) times a day. (Patient taking differently: Take 17 g by mouth daily.), Disp: , Rfl:     senna (SENOKOT) 8.6 mg tablet, Take 2 tablets by mouth daily., Disp: 60 tablet, Rfl: 5    timolo/brimon/dorzo/latanop/PF (TIMOL-BRIMON-DORZO-LATANOP,PF,) 0.5 %-0.15 %- 2 %-0.005 % Drop, Apply 1 drop to eye two (2) times a day., Disp: , Rfl:     DULoxetine (CYMBALTA) 20 MG capsule, Take 1 capsule (20 mg total) by mouth daily. (Patient not taking: Reported on 01/05/2023), Disp: 30 capsule, Rfl: 3  No current facility-administered medications for this visit.    Facility-Administered Medications Ordered in Other Visits:     diphenhydrAMINE (BENADRYL) injection 25 mg, 25 mg, Intravenous, Once PRN, Randa Ngo, MD    EPINEPHrine [facility]) injection 0.3 mg, 0.3 mg, Intramuscular, Once PRN, Randa Ngo, MD    famotidine (PF) (PEPCID) injection 20 mg, 20 mg, Intravenous, Once PRN, Randa Ngo, MD    heparin, porcine (PF) 100 unit/mL injection 500 Units, 500 Units, Intravenous, Q30 Min PRN, Randa Ngo, MD, 500 Units at 01/05/23 1149    meperidine (DEMEROL) injection 25 mg, 25 mg, Intravenous, Once PRN, Randa Ngo, MD    OKAY TO SEND MEDICATION/CHEMOTHERAPY TO OUTPATIENT UNIT, , Other, Once, Randa Ngo, MD    sodium chloride (NS) 0.9 % infusion, 100 mL/hr, Intravenous, Continuous, Randa Ngo, MD, Stopped at 01/05/23 1149    sodium chloride (NS) 0.9 % infusion, 20 mL/hr, Intravenous, Continuous PRN, Randa Ngo, MD    sodium chloride 0.9% (NS) bolus 1,000 mL, 1,000 mL, Intravenous, Once PRN, Randa Ngo, MD    REVIEW OF SYSTEMS:  Complete 10-system review is negative except as above in Interval History.    PHYSICAL EXAM:  BP 125/67  - Pulse 54  - Temp 35.8 ??C (96.4 ??F) (Temporal)  - Resp 18  - Ht 152.4 cm (5')  - Wt 70.1 kg (154 lb 8 oz)  - SpO2 100%  - BMI 30.17 kg/m??   General: Alert, oriented, no acute distress.  HEENT: Sclera anicteric.  Chest: Port site clean.  Cardiovascular: Regular rate and rhythm.  Abdomen: Soft, nontender.  Extremities: Grossly normal range of motion.  Warm, well perfused.      LABORATORY AND RADIOLOGIC STUDIES:  CA 125 74, last 71    Lab Results   Component Value Date    WBC 6.4 01/05/2023    HGB 10.2 (L) 01/05/2023    HCT 30.2 (L) 01/05/2023    PLT 243 01/05/2023       Chemistry        Component Value Date/Time    NA 144 01/05/2023 0824    K 3.7 01/05/2023 0824    CL 116 (H) 01/05/2023 0824    CO2 21.0 01/05/2023 0824    BUN 20 01/05/2023 0824    CREATININE 1.17 (H) 01/05/2023 0824    GLU 103 01/05/2023 0824        Component Value Date/Time    CALCIUM 9.3 01/05/2023 0824    ALKPHOS 73 01/05/2023 0824    AST 25 01/05/2023 0824  ALT 15 01/05/2023 0824    BILITOT 0.3 01/05/2023 1610              ASSESSMENT AND PLAN:  OSHA BORUTA is a 72 y.o. woman with Stage IVB uterine cancer (MMR intact, p53wt, presumed uterine origin based on histology) treated with 6 cycles of carbo/taxol followed by 3 additional cycles of carbo/taxol keytruda (last platinum 05/2022) with progression at cycle #8 keytruda maintenance who presents for cycle#1 of Len/Pem.      1) Uterine Cancer: ECOG1.  Imaging in January with concern for progression on single agent pembro so starting Lenvima for Len/Pem as next line as last platinum 05/2022. Holding lenvima for dental work but will start as soon as cleared. Foundation1 with AKT mutation suggesting may respond to Letrozole/Everolimus as next line therapy. Also potentially candidate for ONC201 trial. Still needs HER2 testing although genomics did not see ERBB2 mutation.   2) Cancer associated pain: Stable. Palliative managing.  3) Chemotherapy associated nausea: Did well with prophylactic steroids. No issues now.    4) ACP: previously addressed but needs to be readdressed at subsequent visits if change in disease status.   5) Bony mets: On zometa, new fracture on imaging, CTM, ortho prior with no recommendations, s/p palliative radiation. Having hip pain and xray on 6/21 showed only OA.  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   7) Right hip arthritis: xray with OA. Resolved with prednisone course and now off taper.  8) Imaging: Next due at end of March. Ordered today.      I have independently reviewed notes from prior visits, results from labs and imaging. The current plan of care requires intensive monitoring for toxicity due to use of anti-neoplastic treatments. Medical decision making was of high complexity due to ongoing risk of morbidity/mortality from chemotherapy treatments.     Junie Spencer. Chestine Spore, MD  Gynecologic Oncology

## 2023-01-05 NOTE — Unmapped (Signed)
Patient was screened high risk for falls, the following steps were taken to reduce the risk of falls:    - Patient using assistive device; remained within patients reach  - Patient oriented to environment  - Patient advised to wait to climb onto exam/procedure table/chair until clinic staff could assist  - Physical environment was clear of slip/trip hazards  - Footwear observed to ensure it was appropriate, if not, patient advised on importance of well fitted, closed toe, sturdy shoes with non-skid soles

## 2023-01-05 NOTE — Unmapped (Signed)
Patient presented for pembrolizumab and zometa infusions today. Patient denied any pain, shortness of breath or nausea during treatment; no s/s of reaction noted. Patient tolerated treatment well and was stable at time of discharge; self-ambulated to lobby with family.

## 2023-01-05 NOTE — Unmapped (Signed)
Lab on 01/05/2023   Component Date Value Ref Range Status    CA 125 01/05/2023 74 (H)  0 - 35 U/mL Final    Sodium 01/05/2023 144  135 - 145 mmol/L Final    Potassium 01/05/2023 3.7  3.5 - 5.1 mmol/L Final    Chloride 01/05/2023 116 (H)  98 - 107 mmol/L Final    CO2 01/05/2023 21.0  20.0 - 31.0 mmol/L Final    Anion Gap 01/05/2023 7  5 - 14 mmol/L Final    BUN 01/05/2023 20  9 - 23 mg/dL Final    Creatinine 16/08/9603 1.17 (H)  0.55 - 1.02 mg/dL Final    BUN/Creatinine Ratio 01/05/2023 17   Final    eGFR CKD-EPI (2021) Female 01/05/2023 51 (L)  >=60 mL/min/1.41m2 Final    eGFR calculated with CKD-EPI 2021 equation in accordance with [employer] and [employer] of Nephrology Task Force recommendations.    Glucose 01/05/2023 103  70 - 179 mg/dL Final    Calcium 54/07/8118 9.3  8.7 - 10.4 mg/dL Final    Albumin 14/78/2956 3.6  3.4 - 5.0 g/dL Final    Total Protein 01/05/2023 6.5  5.7 - 8.2 g/dL Final    Total Bilirubin 01/05/2023 0.3  0.3 - 1.2 mg/dL Final    AST 21/30/8657 25  <=34 U/L Final    ALT 01/05/2023 15  10 - 49 U/L Final    Alkaline Phosphatase 01/05/2023 73  46 - 116 U/L Final    TSH 01/05/2023 2.559  0.550 - 4.780 uIU/mL Final    Free T4 01/05/2023 0.87 (L)  0.89 - 1.76 ng/dL Final    Phosphorus 84/69/6295 3.5  2.4 - 5.1 mg/dL Final    WBC 28/41/3244 6.4  3.6 - 11.2 10*9/L Final    RBC 01/05/2023 3.25 (L)  3.95 - 5.13 10*12/L Final    HGB 01/05/2023 10.2 (L)  11.3 - 14.9 g/dL Final    HCT 11/24/7251 30.2 (L)  34.0 - 44.0 % Final    MCV 01/05/2023 93.2  77.6 - 95.7 fL Final    MCH 01/05/2023 31.3  25.9 - 32.4 pg Final    MCHC 01/05/2023 33.6  32.0 - 36.0 g/dL Final    RDW 66/44/0347 15.3 (H)  12.2 - 15.2 % Final    MPV 01/05/2023 7.5  6.8 - 10.7 fL Final    Platelet 01/05/2023 243  150 - 450 10*9/L Final    Neutrophils % 01/05/2023 71.7  % Final    Lymphocytes % 01/05/2023 16.7  % Final    Monocytes % 01/05/2023 7.5  % Final    Eosinophils % 01/05/2023 3.8  % Final    Basophils % 01/05/2023 0.3  % Final    Absolute Neutrophils 01/05/2023 4.6  1.8 - 7.8 10*9/L Final    Absolute Lymphocytes 01/05/2023 1.1  1.1 - 3.6 10*9/L Final    Absolute Monocytes 01/05/2023 0.5  0.3 - 0.8 10*9/L Final    Absolute Eosinophils 01/05/2023 0.2  0.0 - 0.5 10*9/L Final    Absolute Basophils 01/05/2023 0.0  0.0 - 0.1 10*9/L Final          RED ZONE Means: RED ZONE: Take action now!     You need to be seen right away  Symptoms are at a severe level of discomfort    Call 911 or go to your nearest  Hospital for help     - Bleeding that will not stop    - Hard to  breathe    - New seizure - Chest pain  - Fall or passing out  -Thoughts of hurting    yourself or others      Call 911 if you are going into the RED ZONE                  YELLOW ZONE Means:     Please call with any new or worsening symptom(s), even if not on this list.  Call [phone]  After hours, weekends, and holidays - you will reach a long recording with specific instructions, If not in an emergency such as above, please listen closely all the way to the end and choose the option that relates to your need.   You can be seen by a provider the same day through our Same Day Acute Care for Patients with Cancer program.      YELLOW ZONE: Take action today     Symptoms are new or worsening  You are not within your goal range for:    - Pain    - Shortness of breath    - Bleeding (nose, urine, stool, wound)    - Feeling sick to your stomach and throwing up    - Mouth sores/pain in your mouth or throat    - Hard stool or very loose stools (increase in       ostomy output)    - No urine for 12 hours    - Feeding tube or other catheter/tube issue    - Redness or pain at previous IV or port/catheter site    - Depressed or anxiety   - Swelling (leg, arm, abdomen,     face, neck)  - Skin rash or skin changes  - Wound issues (redness, drainage,    re-opened)  - Confusion  - Vision changes  - Fever >100.4 F or chills  - Worsening cough with mucus that is    green, yellow, or bloody  - Pain or burning when going to the    bathroom  - Home Infusion Pump Issue- call    [phone]         Call your healthcare provider if you are going into the YELLOW ZONE     GREEN ZONE Means:  Your symptoms are under controls  Continue to take your medicine as ordered  Keep all visits to the provider GREEN ZONE: You are in control  No increase or worsening symptoms  Able to take your medicine  Able to drink and eat    - DO NOT use MyChart messages to report red or yellow symptoms. Allow up to 3    business days for a reply.  -MyChart is for non-urgent medication refills, scheduling requests, or other general questions.         [id number] Rev. 05/21/2022  Approved by Oncology Patient Education Committee

## 2023-01-11 NOTE — Unmapped (Signed)
Specialty Medication Follow-up    Robin Weber is a 72 y.o. female with recurrent endometrial cancer who I am seeing for follow up on their treatment with lenvatinib + pembrolizumab.     Chemotherapy: Lenvatinib 20 mg PO daily (not yet started) + Pembrolizumab 200 mg IV q3wk  Start date: Cycle 1 Day 1 (01/05/23)    A/P:   1. Oral Chemotherapy: CBC w/diff and CMP reviewed. Grade 1 anemia which is stable. Grade 1 serum creatinine elevation and grade 1 hypothyroidism both of which have worsened. Although patient meets treatment parameters to start lenvatinib given the acute dental issues will wait for dentist to evaluate prior to starting therapy.   HOLD lenvatinib 20 mg PO daily until dental evaluation    2. Hypothyroidism: Grade 1 which has worsened with low free T4. Given that T4 is barely out of range and TSH remains normal will continue to monitor and re-evaluate prior to next infusion.   Continue to monitor      I spent approximately 15 minutes in direct patient care.    Next follow up: Pending dental evaluation    Referring physician: Dr. Valeda Malm, PharmD, BCOP, CPP  Gynecologic Oncology Clinic Pharmacist  Pager: [phone]    S/O: [name] presents to clinic prior to immunotherapy. She confirmed that she has received lenvatinib therapy but has not yet started it as recommended. She endorses that she had an intense jaw pain for which she had to have dental work done that also required antibiotics. She reports that her jaw still feels very tender and she is concerned about inflammation. She endorses that after a few days of augmentin she experienced diarrhea which has resolved since completion of this therapy. She endorses some trouble breathing with and without activity with a cough but denies a fever. Her blood pressure upon presentation was 129/60 mmHg.    Medications reviewed and updated in EPIC? no    Missed doses: N/A (not yet started)    Labs  Lab on 01/05/2023   Component Date Value Ref Range Status    CA 125 01/05/2023 74 (H)  0 - 35 U/mL Final    Sodium 01/05/2023 144  135 - 145 mmol/L Final    Potassium 01/05/2023 3.7  3.5 - 5.1 mmol/L Final    Chloride 01/05/2023 116 (H)  98 - 107 mmol/L Final    CO2 01/05/2023 21.0  20.0 - 31.0 mmol/L Final    Anion Gap 01/05/2023 7  5 - 14 mmol/L Final    BUN 01/05/2023 20  9 - 23 mg/dL Final    Creatinine 14/78/2956 1.17 (H)  0.55 - 1.02 mg/dL Final    BUN/Creatinine Ratio 01/05/2023 17   Final    eGFR CKD-EPI (2021) Female 01/05/2023 51 (L)  >=60 mL/min/1.32m2 Final    eGFR calculated with CKD-EPI 2021 equation in accordance with [employer] and [employer] of Nephrology Task Force recommendations.    Glucose 01/05/2023 103  70 - 179 mg/dL Final    Calcium 21/30/8657 9.3  8.7 - 10.4 mg/dL Final    Albumin 84/69/6295 3.6  3.4 - 5.0 g/dL Final    Total Protein 01/05/2023 6.5  5.7 - 8.2 g/dL Final    Total Bilirubin 01/05/2023 0.3  0.3 - 1.2 mg/dL Final    AST 28/41/3244 25  <=34 U/L Final    ALT 01/05/2023 15  10 - 49 U/L Final    Alkaline Phosphatase 01/05/2023 73  46 -  116 U/L Final    TSH 01/05/2023 2.559  0.550 - 4.780 uIU/mL Final    Free T4 01/05/2023 0.87 (L)  0.89 - 1.76 ng/dL Final    Phosphorus 16/08/9603 3.5  2.4 - 5.1 mg/dL Final    WBC 54/07/8118 6.4  3.6 - 11.2 10*9/L Final    RBC 01/05/2023 3.25 (L)  3.95 - 5.13 10*12/L Final    HGB 01/05/2023 10.2 (L)  11.3 - 14.9 g/dL Final    HCT 14/78/2956 30.2 (L)  34.0 - 44.0 % Final    MCV 01/05/2023 93.2  77.6 - 95.7 fL Final    MCH 01/05/2023 31.3  25.9 - 32.4 pg Final    MCHC 01/05/2023 33.6  32.0 - 36.0 g/dL Final    RDW 21/30/8657 15.3 (H)  12.2 - 15.2 % Final    MPV 01/05/2023 7.5  6.8 - 10.7 fL Final    Platelet 01/05/2023 243  150 - 450 10*9/L Final    Neutrophils % 01/05/2023 71.7  % Final    Lymphocytes % 01/05/2023 16.7  % Final    Monocytes % 01/05/2023 7.5  % Final    Eosinophils % 01/05/2023 3.8  % Final    Basophils % 01/05/2023 0.3  % Final    Absolute Neutrophils 01/05/2023 4.6  1.8 - 7.8 10*9/L Final    Absolute Lymphocytes 01/05/2023 1.1  1.1 - 3.6 10*9/L Final    Absolute Monocytes 01/05/2023 0.5  0.3 - 0.8 10*9/L Final    Absolute Eosinophils 01/05/2023 0.2  0.0 - 0.5 10*9/L Final    Absolute Basophils 01/05/2023 0.0  0.0 - 0.1 10*9/L Final

## 2023-01-13 ENCOUNTER — Telehealth: Payer: Self-pay

## 2023-01-13 NOTE — Telephone Encounter (Signed)
Palliative care outreach x2. SW LVM for patient to complete telephonic check in and schedule next PC home visit

## 2023-01-14 NOTE — Unmapped (Signed)
Robin Weber has been contacted in regards to their refill of Lenvima. At this time, they have declined refill due to  has not started (hopefully 01/21/23) . Refill assessment call date has been updated per the patient's request.

## 2023-01-14 NOTE — Unmapped (Signed)
Specialty Medication Follow-up    Robin Weber is a 72 y.o. female with recurrent endometrial cancer who I am seeing for follow up on their treatment with lenvatinib + pembrolizumab.     Chemotherapy: Lenvatinib 20 mg PO daily (not yet started) + Pembrolizumab 200 mg IV q3wk  Start date: Cycle 1 Day 1 (01/05/23)    A/P:   1. Oral Chemotherapy: Labs were previously ok for treatment no new labs obtained. Will plan to start lenvatinib 20 mg PO daily on 3/1. Administration instructions reviewed and reinforced the importance of blood pressure assessments upon starting.   Start lenvatinib 20 mg PO daily on 3/1    2. Hypothyroidism: Not evaluated at this visit but will continue to monitor while on this regimen.   Continue to monitor      I spent approximately 15 minutes in direct patient care.    Next follow up: In 1 week after starting    Referring physician: Dr. Valeda Malm, PharmD, BCOP, CPP  Gynecologic Oncology Clinic Pharmacist  Pager: [phone]    S/O: Robin Weber was contacted via telephone regarding oral chemotherapy initiation. Patient endorses that her mouth is much improved and no longer hurting or swollen. She reports that she is ready to start the new medication.     Medications reviewed and updated in EPIC? no    Missed doses: N/A (not yet started)    Labs (no new labs)    Gynecologic Oncology    Gynecologic Oncology Metrics:      Dose and Schedule: Lenvatinib 20 mg PO daily + Pembrolizumab 200 mg IV q3wk     Chemotherapy Dose: Dose Documented     Chemotherapy Schedule: Schedule documented

## 2023-01-18 NOTE — Unmapped (Signed)
Telephone Call: L hip pain    A/P  1. Arthritis: Grade 2 which has improved with prednisone but did acutely worsen over the weekend. Will continue to monitor and this is likely secondary to pembrolizumab as it previously was.   Ok to continue prednisone 2.5 mg PO daily     S/O: Robin Weber is a 72 y.o. female with recurrent endometrial cancer about to start lenvatinib + pembrolizumab. She reports that over the weekend she started to experience left hip pain again which was a throb that extended down leg. It got to the point where she had trouble putting pressure on the leg. She took a dose of prednisone 5 mg daily on Saturday and Sunday. She took prednisone 2.5 mg daily Monday and Tuesday. She reports that she is now feeling much better and back to being able to walk without her walker.     I spent approximately 10 minutes in patient care activities.    Referring physician: Dr. Valeda Malm, PharmD, BCOP, CPP  Gynecologic Oncology Clinic Pharmacist  Pager: [phone]

## 2023-01-24 NOTE — Unmapped (Signed)
Specialty Medication Follow-up    Robin Weber is a 72 y.o. female with recurrent endometrial cancer who I am seeing for follow up on their treatment with lenvatinib + pembrolizumab.     Chemotherapy: Lenvatinib 20 mg PO daily + Pembrolizumab 200 mg IV q3wk  Start date: Cycle 1 Day 1 (01/05/23)    A/P:   1. Oral Chemotherapy: No new labs to review. Grade 1 fatigue which has worsened. No grade 3 toxicities therefore will continue at current dose intensity. Will repeat labs prior to infusion.   Continue lenvatinib 20 mg PO daily  Obtain labs prior to next infusion      I spent approximately 5 minutes in direct patient care.    Next follow up: Later this week for toxicity assessment    Referring physician: Dr. Valeda Malm, PharmD, BCOP, CPP  Gynecologic Oncology Clinic Pharmacist  Pager: [phone]    S/O: Robin Weber was contacted via telephone regarding oral chemotherapy initiation. She reports that she has already started to experience dysphonia. She reports that she was feeling fine on Friday but did start to feel unwell on Saturday. She had a general unwell feeling accompanied by nausea. She reports that she started to feel better Sunday but still experiencing fatigue. Her blood pressures have been the following: 119/53 mmHg, 131/55 mmHg, 100/60 mmHg.     Medications reviewed and updated in EPIC? no    Missed doses: ----    Labs (no new labs)    Gynecologic Oncology    Gynecologic Oncology Metrics:      Dose and Schedule: Lenvatinib 20 mg PO daily + Pembrolizumab 200 mg IV q3wk     Chemotherapy Dose: Dose Documented     Chemotherapy Schedule: Schedule documented     NCI CTCAE: Fatigue - Grade 1

## 2023-01-26 ENCOUNTER — Ambulatory Visit: Admit: 2023-01-26 | Discharge: 2023-01-26 | Payer: MEDICARE

## 2023-01-26 ENCOUNTER — Other Ambulatory Visit: Admit: 2023-01-26 | Discharge: 2023-01-26 | Payer: MEDICARE

## 2023-01-26 DIAGNOSIS — E032 Hypothyroidism due to medicaments and other exogenous substances: Principal | ICD-10-CM

## 2023-01-26 DIAGNOSIS — C549 Malignant neoplasm of corpus uteri, unspecified: Principal | ICD-10-CM

## 2023-01-26 DIAGNOSIS — R899 Unspecified abnormal finding in specimens from other organs, systems and tissues: Principal | ICD-10-CM

## 2023-01-26 DIAGNOSIS — R0602 Shortness of breath: Principal | ICD-10-CM

## 2023-01-26 DIAGNOSIS — D8989 Other specified disorders involving the immune mechanism, not elsewhere classified: Principal | ICD-10-CM

## 2023-01-26 DIAGNOSIS — R799 Abnormal finding of blood chemistry, unspecified: Principal | ICD-10-CM

## 2023-01-26 DIAGNOSIS — C801 Malignant (primary) neoplasm, unspecified: Principal | ICD-10-CM

## 2023-01-26 LAB — COMPREHENSIVE METABOLIC PANEL
ALBUMIN: 3.6 g/dL (ref 3.4–5.0)
ALKALINE PHOSPHATASE: 80 U/L (ref 46–116)
ALT (SGPT): 9 U/L — ABNORMAL LOW (ref 10–49)
ANION GAP: 9 mmol/L (ref 5–14)
AST (SGOT): 25 U/L (ref <=34)
BILIRUBIN TOTAL: 0.6 mg/dL (ref 0.3–1.2)
BLOOD UREA NITROGEN: 16 mg/dL (ref 9–23)
BUN / CREAT RATIO: 12
CALCIUM: 9.7 mg/dL (ref 8.7–10.4)
CHLORIDE: 108 mmol/L — ABNORMAL HIGH (ref 98–107)
CO2: 25 mmol/L (ref 20.0–31.0)
CREATININE: 1.36 mg/dL — ABNORMAL HIGH
EGFR CKD-EPI (2021) FEMALE: 42 mL/min/{1.73_m2} — ABNORMAL LOW (ref >=60)
GLUCOSE RANDOM: 96 mg/dL (ref 70–179)
POTASSIUM: 3.4 mmol/L — ABNORMAL LOW (ref 3.5–5.1)
PROTEIN TOTAL: 7.1 g/dL (ref 5.7–8.2)
SODIUM: 142 mmol/L (ref 135–145)

## 2023-01-26 LAB — TSH: THYROID STIMULATING HORMONE: 4.966 u[IU]/mL — ABNORMAL HIGH (ref 0.550–4.780)

## 2023-01-26 LAB — CBC W/ AUTO DIFF
BASOPHILS ABSOLUTE COUNT: 0 10*9/L (ref 0.0–0.1)
BASOPHILS RELATIVE PERCENT: 0.3 %
EOSINOPHILS ABSOLUTE COUNT: 0.1 10*9/L (ref 0.0–0.5)
EOSINOPHILS RELATIVE PERCENT: 1.6 %
HEMATOCRIT: 32.6 % — ABNORMAL LOW (ref 34.0–44.0)
HEMOGLOBIN: 11 g/dL — ABNORMAL LOW (ref 11.3–14.9)
LYMPHOCYTES ABSOLUTE COUNT: 1.2 10*9/L (ref 1.1–3.6)
LYMPHOCYTES RELATIVE PERCENT: 13 %
MEAN CORPUSCULAR HEMOGLOBIN CONC: 33.9 g/dL (ref 32.0–36.0)
MEAN CORPUSCULAR HEMOGLOBIN: 30.9 pg (ref 25.9–32.4)
MEAN CORPUSCULAR VOLUME: 91.2 fL (ref 77.6–95.7)
MEAN PLATELET VOLUME: 7.1 fL (ref 6.8–10.7)
MONOCYTES ABSOLUTE COUNT: 0.9 10*9/L — ABNORMAL HIGH (ref 0.3–0.8)
MONOCYTES RELATIVE PERCENT: 9.4 %
NEUTROPHILS ABSOLUTE COUNT: 6.9 10*9/L (ref 1.8–7.8)
NEUTROPHILS RELATIVE PERCENT: 75.7 %
PLATELET COUNT: 248 10*9/L (ref 150–450)
RED BLOOD CELL COUNT: 3.57 10*12/L — ABNORMAL LOW (ref 3.95–5.13)
RED CELL DISTRIBUTION WIDTH: 15.3 % — ABNORMAL HIGH (ref 12.2–15.2)
WBC ADJUSTED: 9.1 10*9/L (ref 3.6–11.2)

## 2023-01-26 LAB — PHOSPHORUS: PHOSPHORUS: 2.6 mg/dL (ref 2.4–5.1)

## 2023-01-26 LAB — T4, FREE: FREE T4: 0.9 ng/dL (ref 0.89–1.76)

## 2023-01-26 LAB — CA 125: CA 125: 90 U/mL — ABNORMAL HIGH (ref 0–35)

## 2023-01-26 MED ORDER — FAMOTIDINE 20 MG TABLET
ORAL_TABLET | Freq: Two times a day (BID) | ORAL | 5 refills | 30 days | Status: CP | PRN
Start: 2023-01-26 — End: 2024-01-26

## 2023-01-26 MED ADMIN — heparin, porcine (PF) 100 unit/mL injection 500 Units: 500 [IU] | INTRAVENOUS | @ 18:00:00 | Stop: 2023-01-27

## 2023-01-26 MED ADMIN — sodium chloride (NS) 0.9 % infusion: 100 mL/h | INTRAVENOUS | @ 17:00:00

## 2023-01-26 MED ADMIN — pembrolizumab (KEYTRUDA) 200 mg in sodium chloride (NS) 0.9 % 50 mL IVPB: 200 mg | INTRAVENOUS | @ 17:00:00 | Stop: 2023-01-26

## 2023-01-26 MED ADMIN — potassium chloride ER tablet 40 mEq: 40 meq | ORAL | @ 17:00:00

## 2023-01-26 NOTE — Unmapped (Signed)
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 Following cycle#1 of carbo/taxol on 11/18/21 she was admitted on 11/20/2021 for dysuria and left groin pain with the etiology due to a destructive osseous lytic lesion in her left inferior pubic ramus. Had palliative radiation to the bony lesion completed on 1/10. Received cycle#2 on 12/09/21 prior to discharge to SNF. Her course was complicated by urinary retention due to tumor burden with failed voiding trials.      Adenocarcinoma (CMS-HCC)   11/04/2021 Initial Diagnosis    Adenocarcinoma (CMS-HCC)     11/18/2021 - 06/09/2022 Chemotherapy    OP GYN PACLITAXEL/CARBOPLATIN  PACLitaxel 175 mg/m2 IV on day 1, CARBOplatin AUC6 IV on day 1, every 21 days     07/02/2022 -  Chemotherapy    OP PEMBROLIZUMAB 200 MG Q3W  Pembrolizumab 200 mg IV on Day 1  21-day cycle     Malignant neoplasm of corpus uteri, unspecified (CMS-HCC)   06/29/2022 Initial Diagnosis    Malignant neoplasm of corpus uteri, unspecified (CMS-HCC)     07/02/2022 -  Chemotherapy    OP PEMBROLIZUMAB 200 MG Q3W  Pembrolizumab 200 mg IV on Day 1  21-day cycle     09/10/2022 Surgery    Procedure on 09/10/22:  Robotic-assisted total laparoscopic hysterectomy, left salpingo-oophorectomy, appendectomy      Operative Findings:  Bimanual exam without palpable adnexal masses, small mobile uterus. Normal appearing cervix without lesions. Upper abdominal survey normal. Mild intraperitoneal adhesive disease, including a thick adhesion of the transverse colon to the anterior abdominal wall and adhesion of the appendix to the right uterine cornua necessitating appendectomy. No evidence of residual intraperitoneal disease.      09/22/2022 -  Cancer Staged    Cancer Staging   No matching staging information was found for the patient.       09/22/2022 Tumor Board    FIGO Stage: IVB endometrial endometrioid adenocarcinoma, FIGO grade 2, extensive +LVSI, MMRi, p53 WT; s/p NACT followed by 3 cycles single-agent pembrolizumab and interval palliative robotic hysterectomy.  Plan:   - Continue maintenance pembrolizumab.  - Obtain ER/PR staining.     Sources:   Eskander et al. (2023) Pembrolizumab plus chemotherapy in advanced endometrial cancer (ZO109). NEJM.      09/22/2022 -  Cancer Staged    Staging form: Corpus Uteri - Carcinoma and Carcinosarcoma, AJCC 8th Edition  - Clinical stage from 09/22/2022: FIGO Stage IVB - Signed by Bary Leriche, MD on 09/23/2022       12/07/2022 -  Other    Foundation1:  AKT1 (Pathogenic)  CTNNB1 (Pathogenic)         INTERVAL HISTORY:  Lost voice. Taking 20mg  Lenvima. Having increased SOB. Improved with rest. Cough some increased.     PAST MEDICAL/SURGICAL HX:  Past Medical History:   Diagnosis Date    GERD (gastroesophageal reflux disease)     HTN (hypertension)     Osteoarthritis     Pelvic mass        Past Surgical History:   Procedure Laterality Date    CESAREAN SECTION  1988    IR INSERT PORT AGE GREATER THAN 5 YRS  11/12/2021    IR INSERT PORT AGE GREATER THAN 5 YRS 11/12/2021 Ammie Dalton, MD IMG VIR [facility]  PR APPENDECTOMY,W OTHR PROC N/A 09/10/2022    Procedure: APPENDECTOMY; WHEN DONE FOR INDICATED PURPOSE @ TIME OF OTHER MAJOR PROCEDURE;  Surgeon: Randa Ngo, MD;  Location: MAIN OR [facility];  Service: Gynecology Oncology    PR BIOPSY OF VAGINA,SIMPLE N/A 10/27/2021    Procedure: BIOPSY OF VAGINAL MUCOSA; SIMPLE (SEPARATE PROCEDURE);  Surgeon: Gaynelle Cage, MD;  Location: [facility] OR [facility];  Service: Family Planning    PR LAPAROSCOPY W TOT HYSTERECTUTERUS <=250 [address] Filler TUBE/OVARY Bilateral 09/10/2022    Procedure: ROBOTIC TOTAL HYSTERECTOMY WITH LEFT SALPINGOOPHORECTOMY, RIGHT TUBE AND OVARY ABSENT;  Surgeon: Randa Ngo, MD;  Location: MAIN OR [facility];  Service: Gynecology Oncology       Family History   Problem Relation Age of Onset    Anesthesia problems Neg Hx     Bleeding Disorder Neg Hx        Social History     Socioeconomic History    Marital status: Married     Spouse name: None    Number of children: None    Years of education: None    Highest education level: None   Tobacco Use    Smoking status: Never    Smokeless tobacco: Never   Vaping Use    Vaping status: Never Used   Substance and Sexual Activity    Alcohol use: Not Currently    Drug use: Never     Social Determinants of Health     Financial Resource Strain: Low Risk  (10/27/2021)    Overall Financial Resource Strain (CARDIA)     Difficulty of Paying Living Expenses: Not hard at all   Food Insecurity: No Food Insecurity (10/27/2021)    Hunger Vital Sign     Worried About Running Out of Food in the Last Year: Never true     Ran Out of Food in the Last Year: Never true   Transportation Needs: No Transportation Needs (10/27/2021)    PRAPARE - Therapist, art (Medical): No     Lack of Transportation (Non-Medical): No       CURRENT MEDICATIONS:    Current Outpatient Medications:     amoxicillin (AMOXIL) 875 MG tablet, Take 1 tablet (875 mg total) by mouth two (2) times a day., Disp: , Rfl:     apixaban (ELIQUIS) 5 mg Tab, Take 1 tablet (5 mg total) by mouth Two (2) times a day., Disp: 60 tablet, Rfl: 5    chlorhexidine (PERIDEX) 0.12 % solution, 15 mL by Oromucosal route two (2) times a day., Disp: , Rfl:     diazePAM (VALIUM) 5 MG tablet, Take 1 tablet (5 mg total) by mouth nightly as needed for anxiety., Disp: , Rfl:     DULoxetine (CYMBALTA) 20 MG capsule, Take 1 capsule (20 mg total) by mouth daily. (Patient not taking: Reported on 01/05/2023), Disp: 30 capsule, Rfl: 3    gabapentin (NEURONTIN) 300 MG capsule, Please take 1 capsule (300 mg) in the morning and 2 capsules (600 mg) in the evening, Disp: 90 capsule, Rfl: 2    latanoprost (XALATAN) 0.005 % ophthalmic solution, Administer 1 drop to both eyes nightly., Disp: , Rfl:     lenvatinib 20 mg/day (10 mg x 2) cap, Take 2 capsules (20 mg) by mouth daily., Disp: 60 capsule, Rfl: 5    lidocaine-prilocaine (EMLA) 2.5-2.5 % cream, Apply topically daily as needed. Prior to port access, Disp: 30 g, Rfl: 2    NON  FORMULARY, Nature Made Iron Gummy - two gummies once a day, Disp: , Rfl:     NON FORMULARY, Nature Made Vitamin B12 Gummy - two gummies once a day, Disp: , Rfl:     ondansetron (ZOFRAN-ODT) 4 MG disintegrating tablet, Take 1 tablet (4 mg total) by mouth every eight (8) hours as needed for nausea., Disp: 30 tablet, Rfl: 2    polyethylene glycol (MIRALAX) 17 gram packet, Take 17 g by mouth Two (2) times a day. (Patient taking differently: Take 17 g by mouth daily.), Disp: , Rfl:     senna (SENOKOT) 8.6 mg tablet, Take 2 tablets by mouth daily., Disp: 60 tablet, Rfl: 5    timolo/brimon/dorzo/latanop/PF (TIMOL-BRIMON-DORZO-LATANOP,PF,) 0.5 %-0.15 %- 2 %-0.005 % Drop, Apply 1 drop to eye two (2) times a day., Disp: , Rfl:     REVIEW OF SYSTEMS:  Complete 10-system review is negative except as above in Interval History.    PHYSICAL EXAM:  BP 133/68  - Pulse 77  - Temp 36.1 ??C (97 ??F) (Temporal)  - Resp 16  - Wt 67.4 kg (148 lb 9.6 oz)  - SpO2 96%  - BMI 29.02 kg/m??   General: Alert, oriented, no acute distress.  HEENT: Sclera anicteric. Dental abscess healed.  Chest: Port site clean. Decreased BS in right middle and lower lobe. Some end expiratory wheeze bilaterally.   Cardiovascular: Regular rate and rhythm.  Abdomen: Soft, nontender.  Extremities: Grossly normal range of motion.  Warm, well perfused.      LABORATORY AND RADIOLOGIC STUDIES:  CA 125 pending, last 74    Lab Results   Component Value Date    WBC 9.1 01/26/2023    HGB 11.0 (L) 01/26/2023    HCT 32.6 (L) 01/26/2023    PLT 248 01/26/2023     CXR ordered stat    ASSESSMENT AND PLAN:  JAQUIA GIZZI is a 72 y.o. woman with Stage IVB uterine cancer (MMR intact, p53wt, presumed uterine origin based on histology) treated with 6 cycles of carbo/taxol followed by 3 additional cycles of carbo/taxol keytruda (last platinum 05/2022) with progression at cycle #8 keytruda maintenance so transitioned to Len/Pem.      1) Uterine Cancer: ECOG1.  Imaging in January with concern for progression on single agent pembro and switched to Len/Pem. Tolerating 20mg  Lenvima, but increased fatigue and SOB. Foundation1 with AKT mutation suggesting may respond to Letrozole/Everolimus as next line therapy. Also potentially candidate for ONC201 trial. HER2 testing sent with 2+ and reflex to FISH. Also candidate for Doxil.   2) Cancer associated pain: Stable. Palliative managing.  3) Chemotherapy associated nausea: Did well with prophylactic steroids. No issues now.    4) ACP: previously addressed but needs to be readdressed at subsequent visits if change in disease status.   5) Bony mets: On zometa, new fracture on imaging, CTM, ortho prior with no recommendations, s/p palliative radiation. Having hip pain and xray on 6/21 showed only OA.  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   7) Right hip arthritis: xray with OA. Resolved with prednisone course and now off taper.  8) Imaging: Next due at end of March. Scheduled 3/22.   9) SOB: ?effusion on exam, stat CXR now. Will obtain CT chest noncon to rule out pneumonitis given [place]. ER precautions reviewed.      I have independently reviewed notes from prior visits, results from labs and imaging. The current plan of care requires intensive monitoring for toxicity due to use of  anti-neoplastic treatments. Medical decision making was of high complexity due to ongoing risk of morbidity/mortality from chemotherapy treatments.     Junie Spencer. Chestine Spore, MD  Gynecologic Oncology

## 2023-01-26 NOTE — Unmapped (Addendum)
Outgoing call to the pt. Spoke to daughter. Pt/daughter currently at [facility] Infusion dept. Advised pt/daughter regarding imaging for today (CT Chest Wo Contrast), as per Dr. Chestine Spore. Received notification from [facility] (No authorization is required for Medicare) for same day imaging.  Advised daughter that all appt details are listed in My Chart. Advised pt/daughter to contact office with any further questions/concerns. Pt verbalized understanding and is in agreement with the plan.

## 2023-01-26 NOTE — Unmapped (Signed)
RED ZONE Means: RED ZONE: Take action now!     You need to be seen right away  Symptoms are at a severe level of discomfort    Call 911 or go to your nearest  Hospital for help     - Bleeding that will not stop    - Hard to breathe    - New seizure - Chest pain  - Fall or passing out  -Thoughts of hurting    yourself or others      Call 911 if you are going into the RED ZONE                  YELLOW ZONE Means:     Please call with any new or worsening symptom(s), even if not on this list.  Call [phone]  After hours, weekends, and holidays - you will reach a long recording with specific instructions, If not in an emergency such as above, please listen closely all the way to the end and choose the option that relates to your need.   You can be seen by a provider the same day through our Same Day Acute Care for Patients with Cancer program.      YELLOW ZONE: Take action today     Symptoms are new or worsening  You are not within your goal range for:    - Pain    - Shortness of breath    - Bleeding (nose, urine, stool, wound)    - Feeling sick to your stomach and throwing up    - Mouth sores/pain in your mouth or throat    - Hard stool or very loose stools (increase in       ostomy output)    - No urine for 12 hours    - Feeding tube or other catheter/tube issue    - Redness or pain at previous IV or port/catheter site    - Depressed or anxiety   - Swelling (leg, arm, abdomen,     face, neck)  - Skin rash or skin changes  - Wound issues (redness, drainage,    re-opened)  - Confusion  - Vision changes  - Fever >100.4 F or chills  - Worsening cough with mucus that is    green, yellow, or bloody  - Pain or burning when going to the    bathroom  - Home Infusion Pump Issue- call    [phone]         Call your healthcare provider if you are going into the YELLOW ZONE     GREEN ZONE Means:  Your symptoms are under controls  Continue to take your medicine as ordered  Keep all visits to the provider GREEN ZONE: You are in control  No increase or worsening symptoms  Able to take your medicine  Able to drink and eat    - DO NOT use MyChart messages to report red or yellow symptoms. Allow up to 3    business days for a reply.  -MyChart is for non-urgent medication refills, scheduling requests, or other general questions.         [id number] Rev. 05/21/2022  Approved by Oncology Patient Education Committee        Lab on 01/26/2023   Component Date Value Ref Range Status    CA 125 01/26/2023 90 (H)  0 - 35 U/mL Final    Sodium 01/26/2023 142  135 - 145 mmol/L Final    Potassium 01/26/2023 3.4 (L)  3.5 - 5.1 mmol/L Final    Chloride 01/26/2023 108 (H)  98 - 107 mmol/L Final    CO2 01/26/2023 25.0  20.0 - 31.0 mmol/L Final    Anion Gap 01/26/2023 9  5 - 14 mmol/L Final    BUN 01/26/2023 16  9 - 23 mg/dL Final    Creatinine 16/08/9603 1.36 (H)  0.55 - 1.02 mg/dL Final    BUN/Creatinine Ratio 01/26/2023 12   Final    eGFR CKD-EPI (2021) Female 01/26/2023 42 (L)  >=60 mL/min/1.75m2 Final    eGFR calculated with CKD-EPI 2021 equation in accordance with [employer] and [employer] of Nephrology Task Force recommendations.    Glucose 01/26/2023 96  70 - 179 mg/dL Final    Calcium 54/07/8118 9.7  8.7 - 10.4 mg/dL Final    Albumin 14/78/2956 3.6  3.4 - 5.0 g/dL Final    Total Protein 01/26/2023 7.1  5.7 - 8.2 g/dL Final    Total Bilirubin 01/26/2023 0.6  0.3 - 1.2 mg/dL Final    AST 21/30/8657 25  <=34 U/L Final    ALT 01/26/2023 9 (L)  10 - 49 U/L Final    Alkaline Phosphatase 01/26/2023 80  46 - 116 U/L Final    Phosphorus 01/26/2023 2.6  2.4 - 5.1 mg/dL Final    WBC 84/69/6295 9.1  3.6 - 11.2 10*9/L Final    RBC 01/26/2023 3.57 (L)  3.95 - 5.13 10*12/L Final    HGB 01/26/2023 11.0 (L)  11.3 - 14.9 g/dL Final    HCT 28/41/3244 32.6 (L)  34.0 - 44.0 % Final    MCV 01/26/2023 91.2  77.6 - 95.7 fL Final    MCH 01/26/2023 30.9  25.9 - 32.4 pg Final    MCHC 01/26/2023 33.9  32.0 - 36.0 g/dL Final    RDW 11/24/7251 15.3 (H)  12.2 - 15.2 % Final    MPV 01/26/2023 7.1  6.8 - 10.7 fL Final    Platelet 01/26/2023 248  150 - 450 10*9/L Final    Neutrophils % 01/26/2023 75.7  % Final    Lymphocytes % 01/26/2023 13.0  % Final    Monocytes % 01/26/2023 9.4  % Final    Eosinophils % 01/26/2023 1.6  % Final    Basophils % 01/26/2023 0.3  % Final    Absolute Neutrophils 01/26/2023 6.9  1.8 - 7.8 10*9/L Final    Absolute Lymphocytes 01/26/2023 1.2  1.1 - 3.6 10*9/L Final    Absolute Monocytes 01/26/2023 0.9 (H)  0.3 - 0.8 10*9/L Final    Absolute Eosinophils 01/26/2023 0.1  0.0 - 0.5 10*9/L Final    Absolute Basophils 01/26/2023 0.0  0.0 - 0.1 10*9/L Final    Free T4 01/26/2023 0.90  0.89 - 1.76 ng/dL Final    TSH 66/44/0347 4.966 (H)  0.550 - 4.780 uIU/mL Final   ]

## 2023-01-26 NOTE — Unmapped (Signed)
Patient was screened high risk for falls, the following steps were taken to reduce the risk of falls:      - Patient using assistive device; remained within patients reach  - Patient oriented to environment  - Patient advised to wait to climb onto exam/procedure table/chair until clinic staff could assist  - Physical environment was clear of slip/trip hazards  - Footwear observed to ensure it was appropriate, if not, patient advised on importance of well fitted, closed toe, sturdy shoes with non-skid soles  - ???Preventing falls: Care instructions??? placed in patient AVS

## 2023-01-26 NOTE — Unmapped (Signed)
Crcl 41.43, outside parameters for zometa, message sent to CPP [place]  K 3.4 PO

## 2023-01-26 NOTE — Unmapped (Signed)
11:25 -  pt ambulated with stable gait to treatment area for planned treatment with C11D1 Pembro and Zometa after seen and evaluated by provider. Pt states feeling well and denies any complaints at this time - denies any S/Sx of colitis/pneumonitis. Pt with RCW Port accessed PTA, flushed with good BR. Labs resulted and within parameters to treat. Request for drug sent to pharmacy. Pt aware and in agreement with plan. Pt repleted with Potassium per therapy order as K 3.4. Pt's Zometa dose adjusted for elevated Creat per provider.     12:50 - Pt tolerated treatment Pembro well with no adverse reaction noted and unable to stay for her Zometa infusion per daughter and patient. Patient provider Caprice Red MD) made aware and will be administered on next visit. Port with good BR at completion. Pt Port flushed and de-accessed per protocol. Pt given copy of AVS and Pt ambulated from treatment area with stable gait.

## 2023-01-31 DIAGNOSIS — C549 Malignant neoplasm of corpus uteri, unspecified: Principal | ICD-10-CM

## 2023-01-31 MED ORDER — LENVATINIB 14 MG/DAY (10 MG X 1 AND 4 MG X 1) CAPSULE
ORAL_CAPSULE | Freq: Every day | ORAL | 5 refills | 0 days | Status: CP
Start: 2023-01-31 — End: ?
  Filled 2023-02-04: qty 60, 30d supply, fill #0

## 2023-01-31 NOTE — Unmapped (Incomplete)
Horse voice coughing more. Feeling more fatigued. HOLD Decreased appetite.

## 2023-02-01 NOTE — Unmapped (Signed)
Clinical Assessment Needed For: Dose Change  Medication: Lenvima  Last Fill Date/Day Supply: 1-29 / 30  Copay $0  Was previous dose already scheduled to fill: No    Notes to Pharmacist:

## 2023-02-02 NOTE — Unmapped (Signed)
[facility] Clinical Assessment & Refill Coordination Note    Robin Weber, DOB: [birth year]  Phone: [phone] (home)     All above HIPAA information was verified with patient.     Was a Nurse, learning disability used for this call? No    Specialty Medication(s):   Hematology/Oncology: [name]     Current Outpatient Medications   Medication Sig Dispense Refill    amoxicillin (AMOXIL) 875 MG tablet Take 1 tablet (875 mg total) by mouth two (2) times a day.      apixaban (ELIQUIS) 5 mg Tab Take 1 tablet (5 mg total) by mouth Two (2) times a day. 60 tablet 5    chlorhexidine (PERIDEX) 0.12 % solution 15 mL by Oromucosal route two (2) times a day.      diazePAM (VALIUM) 5 MG tablet Take 1 tablet (5 mg total) by mouth nightly as needed for anxiety.      DULoxetine (CYMBALTA) 20 MG capsule Take 1 capsule (20 mg total) by mouth daily. (Patient not taking: Reported on 01/05/2023) 30 capsule 3    famotidine (PEPCID) 20 MG tablet Take 1 tablet (20 mg total) by mouth two (2) times a day as needed for heartburn. 60 tablet 5    gabapentin (NEURONTIN) 300 MG capsule Please take 1 capsule (300 mg) in the morning and 2 capsules (600 mg) in the evening 90 capsule 2    latanoprost (XALATAN) 0.005 % ophthalmic solution Administer 1 drop to both eyes nightly.      lenvatinib 14 mg/day (10 mg x 1 AND 4 mg x 1) cap Take 2 capsules (14 mg) by mouth daily. 60 capsule 5    lidocaine-prilocaine (EMLA) 2.5-2.5 % cream Apply topically daily as needed. Prior to port access 30 g 2    NON FORMULARY Nature Made Iron Gummy - two gummies once a day      NON FORMULARY Nature Made Vitamin B12 Gummy - two gummies once a day      ondansetron (ZOFRAN-ODT) 4 MG disintegrating tablet Take 1 tablet (4 mg total) by mouth every eight (8) hours as needed for nausea. 30 tablet 2    polyethylene glycol (MIRALAX) 17 gram packet Take 17 g by mouth Two (2) times a day. (Patient taking differently: Take 17 g by mouth daily.)      senna (SENOKOT) 8.6 mg tablet Take 2 tablets by mouth daily. 60 tablet 5    timolo/brimon/dorzo/latanop/PF (TIMOL-BRIMON-DORZO-LATANOP,PF,) 0.5 %-0.15 %- 2 %-0.005 % Drop Apply 1 drop to eye two (2) times a day.       No current facility-administered medications for this visit.        Changes to medications: [name] reports no changes at this time.    Allergies   Allergen Reactions    Banana Nausea Only    Meloxicam Nausea Only       Changes to allergies: No    SPECIALTY MEDICATION ADHERENCE     Lenvima 14 mg: 0 days of medicine on hand   Medication Adherence    Patient reported X missed doses in the last month: 0  Specialty Medication: Lenvima 14mg           Specialty medication(s) dose(s) confirmed:  dose decrease to 14mg  daily      Are there any concerns with adherence? No    Adherence counseling provided? Not needed    CLINICAL MANAGEMENT AND INTERVENTION      Clinical Benefit Assessment:    Do you feel  the medicine is effective or helping your condition? Yes    Clinical Benefit counseling provided? Not needed    Adverse Effects Assessment:    Are you experiencing any side effects? No    Are you experiencing difficulty administering your medicine? No    Quality of Life Assessment:    Quality of Life      Oncology  1. What impact has your specialty medication had on the reduction of your daily pain or discomfort level?: None  2. On a scale of 1-10, how would you rate your ability to manage side effects associated with your specialty medication? (1=no issues, 10 = unable to take medication due to side effects): 1            How many days over the past month did your condition/medication  keep you from your normal activities? For example, brushing your teeth or getting up in the morning. 0    Have you discussed this with your provider? Not needed    Acute Infection Status:    Acute infections noted within Epic:  No active infections  Patient reported infection: None    Therapy Appropriateness:    Is therapy appropriate and patient progressing towards therapeutic goals? Yes, therapy is appropriate and should be continued    DISEASE/MEDICATION-SPECIFIC INFORMATION      N/A    Oncology: Is the patient receiving adequate infection prevention treatment? Not applicable  Does the patient have adequate nutritional support? Not applicable    PATIENT SPECIFIC NEEDS     Does the patient have any physical, cognitive, or cultural barriers? No    Is the patient high risk? Yes, patient is taking oral chemotherapy. Appropriateness of therapy as been assessed    Did the patient require a clinical intervention? No    Does the patient require physician intervention or other additional services (i.e., nutrition, smoking cessation, social work)? No    SOCIAL DETERMINANTS OF HEALTH     At the [facility], we have learned that life circumstances - like trouble affording food, housing, utilities, or transportation can affect the health of many of our patients.   That is why we wanted to ask: are you currently experiencing any life circumstances that are negatively impacting your health and/or quality of life? No    Social Determinants of Health     Financial Resource Strain: Low Risk  (10/27/2021)    Overall Financial Resource Strain (CARDIA)     Difficulty of Paying Living Expenses: Not hard at all   Internet Connectivity: Not on file   Food Insecurity: No Food Insecurity (10/27/2021)    Hunger Vital Sign     Worried About Running Out of Food in the Last Year: Never true     Ran Out of Food in the Last Year: Never true   Tobacco Use: Low Risk  (01/26/2023)    Patient History     Smoking Tobacco Use: Never     Smokeless Tobacco Use: Never     Passive Exposure: Not on file   Housing/Utilities: Low Risk  (10/27/2021)    Housing/Utilities     Within the past 12 months, have you ever stayed: outside, in a car, in a tent, in an overnight shelter, or temporarily in someone else's home (i.e. couch-surfing)?: No     Are you worried about losing your housing?: No Within the past 12 months, have you been unable to get utilities (heat, electricity) when it was really needed?: No   Alcohol  Use: Not on file   Transportation Needs: No Transportation Needs (10/27/2021)    PRAPARE - Therapist, art (Medical): No     Lack of Transportation (Non-Medical): No   Substance Use: Not on file   Health Literacy: Not on file   Physical Activity: Not on file   Interpersonal Safety: Not on file   Stress: Not on file   Intimate Partner Violence: Not on file   Depression: Not at risk (01/05/2023)    PHQ-2     PHQ-2 Score: 0   Social Connections: Not on file       Would you be willing to receive help with any of the needs that you have identified today? Not applicable       SHIPPING     Specialty Medication(s) to be Shipped:   Hematology/Oncology: [name]    Other medication(s) to be shipped: No additional medications requested for fill at this time     Changes to insurance: No    Patient was informed of new phone menu: Yes    Delivery Scheduled: Yes, Expected medication delivery date: 02/04/23.     Medication will be delivered via Same Day Courier to the confirmed prescription address in [facility].    The patient will receive a drug information handout for each medication shipped and additional FDA Medication Guides as required.  Verified that patient has previously received a Conservation officer, historic buildings and a Surveyor, mining.    The patient or caregiver noted above participated in the development of this care plan and knows that they can request review of or adjustments to the care plan at any time.      All of the patient's questions and concerns have been addressed.    Rollen Sox, [facility]   [facility] Specialty Pharmacist

## 2023-02-03 NOTE — Unmapped (Signed)
Specialty Medication Follow-up    Robin Weber is a 72 y.o. female with recurrent endometrial cancer who I am seeing for follow up on their treatment with lenvatinib + pembrolizumab.     Chemotherapy: Lenvatinib 20 mg PO daily + Pembrolizumab 200 mg IV q3wk  Start date: Cycle 2 Day 1 (01/26/23)    A/P:   1. Oral Chemotherapy: CBC w/diff and CMP reviewed. Grade 1 anemia which has improved. Grade 2 dyspepsia, grade 1 serum creatinine elevation, and grade 1 fatigue all of which have worsened. Given that patient recently started lenvatinib and is not currently experiencing a grade 3 toxicity will continue at current dose intensity and re-evaluate in 1 week.   Continue lenvatinib 20 mg PO daily  Obtain labs prior to next infusion    2. Dyspepsia: Grade 2 which has worsened. Patient is not currently on pharmacologic therapy therefore will start with an H2RA and re-evaluate. Could consider addition of PPI if not controlled with H2RA.   Start famotidine 20 mg PO BID      I spent approximately 15 minutes in direct patient care.    Next follow up: Next week for toxicity assessment    Referring physician: Dr. Valeda Malm, PharmD, BCOP, CPP  Gynecologic Oncology Clinic Pharmacist  Pager: [phone]    S/O: Robin Weber presents to clinic prior to infusion. Her blood pressure upon presentation was 135/58 mmHg. She reports that she has noticed worsening of fatigue requiring more rest throughout the day. She reports worsening cough and SOB. She reports a hoarse voice which started shortly after starting lenvatinib therapy. She endorses that she experienced loose BM today which is new. She believes that she may be experiencing worsening of acid reflux.     Medications reviewed and updated in EPIC? no    Missed doses: ----    Labs  Lab on 01/26/2023   Component Date Value Ref Range Status    CA 125 01/26/2023 90 (H)  0 - 35 U/mL Final    Sodium 01/26/2023 142  135 - 145 mmol/L Final    Potassium 01/26/2023 3.4 (L)  3.5 - 5.1 mmol/L Final    Chloride 01/26/2023 108 (H)  98 - 107 mmol/L Final    CO2 01/26/2023 25.0  20.0 - 31.0 mmol/L Final    Anion Gap 01/26/2023 9  5 - 14 mmol/L Final    BUN 01/26/2023 16  9 - 23 mg/dL Final    Creatinine 45/40/9811 1.36 (H)  0.55 - 1.02 mg/dL Final    BUN/Creatinine Ratio 01/26/2023 12   Final    eGFR CKD-EPI (2021) Female 01/26/2023 42 (L)  >=60 mL/min/1.52m2 Final    eGFR calculated with CKD-EPI 2021 equation in accordance with [employer] and [employer] of Nephrology Task Force recommendations.    Glucose 01/26/2023 96  70 - 179 mg/dL Final    Calcium 91/47/8295 9.7  8.7 - 10.4 mg/dL Final    Albumin 62/13/0865 3.6  3.4 - 5.0 g/dL Final    Total Protein 01/26/2023 7.1  5.7 - 8.2 g/dL Final    Total Bilirubin 01/26/2023 0.6  0.3 - 1.2 mg/dL Final    AST 78/46/9629 25  <=34 U/L Final    ALT 01/26/2023 9 (L)  10 - 49 U/L Final    Alkaline Phosphatase 01/26/2023 80  46 - 116 U/L Final    Phosphorus 01/26/2023 2.6  2.4 - 5.1 mg/dL Final    WBC 52/84/1324 9.1  3.6 -  11.2 10*9/L Final    RBC 01/26/2023 3.57 (L)  3.95 - 5.13 10*12/L Final    HGB 01/26/2023 11.0 (L)  11.3 - 14.9 g/dL Final    HCT 16/08/9603 32.6 (L)  34.0 - 44.0 % Final    MCV 01/26/2023 91.2  77.6 - 95.7 fL Final    MCH 01/26/2023 30.9  25.9 - 32.4 pg Final    MCHC 01/26/2023 33.9  32.0 - 36.0 g/dL Final    RDW 54/07/8118 15.3 (H)  12.2 - 15.2 % Final    MPV 01/26/2023 7.1  6.8 - 10.7 fL Final    Platelet 01/26/2023 248  150 - 450 10*9/L Final    Neutrophils % 01/26/2023 75.7  % Final    Lymphocytes % 01/26/2023 13.0  % Final    Monocytes % 01/26/2023 9.4  % Final    Eosinophils % 01/26/2023 1.6  % Final    Basophils % 01/26/2023 0.3  % Final    Absolute Neutrophils 01/26/2023 6.9  1.8 - 7.8 10*9/L Final    Absolute Lymphocytes 01/26/2023 1.2  1.1 - 3.6 10*9/L Final    Absolute Monocytes 01/26/2023 0.9 (H)  0.3 - 0.8 10*9/L Final    Absolute Eosinophils 01/26/2023 0.1  0.0 - 0.5 10*9/L Final    Absolute Basophils 01/26/2023 0.0  0.0 - 0.1 10*9/L Final    Free T4 01/26/2023 0.90  0.89 - 1.76 ng/dL Final    TSH 14/78/2956 4.966 (H)  0.550 - 4.780 uIU/mL Final         Gynecologic Oncology    Gynecologic Oncology Metrics:      Dose and Schedule: Lenvatinib 20 mg PO daily + Pembrolizumab 200 mg IV q3wk     Chemotherapy Dose: Dose Documented     Chemotherapy Schedule: Schedule documented     NCI CTCAE: Anemia - Grade 1, Dyspepsia - Grade 2, Serum creatinine - Grade 1, Fatigue - Grade 1        Interventions: Additional agent prescribed for side effect management  Comments: Start famotidine 20 mg PO BID

## 2023-02-07 NOTE — Unmapped (Signed)
Specialty Medication Follow-up    Robin Weber is a 72 y.o. female with recurrent endometrial cancer who I am seeing for follow up on their treatment with lenvatinib + pembrolizumab.     Chemotherapy: Lenvatinib 14 mg PO daily + Pembrolizumab 200 mg IV q3wk  Start date: Cycle 1 Day 1 (01/05/23)    A/P:   1. Oral Chemotherapy: No new labs to review. No grade 3 toxicities therefore patient meets treatment parameters to restart lenvatinib at reduced dose. Will reassess toxicity later this week.   Restart lenvatinib 14 mg PO daily (started 02/07/23)  Obtain labs prior to next infusion      I spent approximately 10 minutes in direct patient care.    Next follow up: Later this week for toxicity assessment (02/11/23)    Referring physician: Dr. Valeda Malm, PharmD, BCOP, CPP  Gynecologic Oncology Clinic Pharmacist  Pager: [phone]    S/O: Robin Weber was contacted via telephone regarding oral chemotherapy toxicity assessment. She reports that she has been feeling much improved since stopping the higher dose of lenvatinib. She continues to have a cough but her voice hoarseness and appetite have returned to normal. She noticed that the shoulder pain she was experiencing has also improved since stopping lenvatinib. She reports that since she is feeling so well that she started lenvatinib at the lower dose today.     Medications reviewed and updated in EPIC? no    Missed doses: ----    Labs (no new labs)    Gynecologic Oncology    Gynecologic Oncology Metrics:      Dose and Schedule: Lenvatinib 14 mg PO daily + Pembrolizumab 200 mg IV q3wk     Chemotherapy Dose: Dose Documented     Chemotherapy Schedule: Schedule documented           Comments: Dose reduction due to toxicity

## 2023-02-11 MED ORDER — BENZONATATE 100 MG CAPSULE
ORAL_CAPSULE | Freq: Three times a day (TID) | ORAL | 1 refills | 10 days | Status: CP | PRN
Start: 2023-02-11 — End: 2024-02-11

## 2023-02-11 NOTE — Unmapped (Signed)
Specialty Medication Follow-up    Robin Weber is a 72 y.o. female with recurrent endometrial cancer who I am seeing for follow up on their treatment with lenvatinib + pembrolizumab.     Chemotherapy: Lenvatinib 14 mg PO daily + Pembrolizumab 200 mg IV q3wk  Start date: Cycle 1 Day 1 (01/05/23)    A/P:   1. Oral Chemotherapy: No new labs to review. No grade 3 toxicities therefore will continue at current dose intensity. Will repeat labs prior to next infusion.   Continue lenvatinib 14 mg PO daily (started 02/07/23)  Obtain labs prior to next infusion    2. Cough:  Likely secondary to disease burden therefore will prescribe antitussive agent.   Start benzonatate 10 mg PO TID PRN cough    I spent approximately 10 minutes in direct patient care.    Next follow up: Next week prior to infusion (02/16/23)     Referring physician: Dr. Valeda Malm, PharmD, BCOP, CPP  Gynecologic Oncology Clinic Pharmacist  Pager: [phone]    S/O: Robin Weber was contacted via telephone regarding oral chemotherapy toxicity assessment. She reports that her blood pressure is overall stable and looks good. She didn't have her blood pressure log in front of her but she reports systolic blood pressures 127 mmHg and 145 mmHg. She reports that her energy and appetite remain good overall despite restarting lenvatinib. She does endorse a hoarse voice but reports that this is tolerable. She does report a cough that remains present and is bothersome. She is not currently taking anything for her cough.     Medications reviewed and updated in EPIC? no    Missed doses: ----    Labs (no new labs)    Gynecologic Oncology    Gynecologic Oncology Metrics:      Dose and Schedule: Lenvatinib 14 mg PO daily + Pembrolizumab 200 mg IV q3wk     Chemotherapy Dose: Dose Documented     Chemotherapy Schedule: Schedule documented

## 2023-02-15 NOTE — Unmapped (Addendum)
 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    11/18/21-03/12/22: Paclitaxel/Carboplatin x6 cycles  11/2021: 2000cGy palliative XRT to left pubic ramus lesion  04/16/22-06/09/22: Paclitaxel/Carboplatin/Pembrolizumab  x 3 cycles  07/02/22--09/01/22: maintenance pembrolizumab   09/10/2022: Palliative robotic-assisted total laparoscopic hysterectomy, LSO, appendectomy, R2- FIGO grade 2 endometrioid adenocarcinoma with focal squamous differentiation, full thickness myometrial invasion. LVI+, margins negative; p53 WT; MMR intact; pT3aNxM1  09/22/22-11/24/22: pembrolizumab   11/26/22: Disease progression- increasing left common iliac adenopathy; increase in size of innumerable metastatic pulmonary nodules  01/05/23-present: Lenvatinib /pembrolizumab  CA125- 74    Genetics/actionable:   [employer]: MSS, TMB low  AKT1 E17K-- possible mTOR  CTNNB1 T41N-- possible mTOR  HER2/ERBB2 FISH negative    Assessment:  Robin Weber is a 72 yo female with progressive stage IVb endometrial cancer. She was started on lenvatinib /pembrolizumab  after disease progression on maintenance pembrolizumab .  She was started on 20 mg lenvatinib  daily but now has been dose-reduced to 14 mg daily due to issues with tolerance (fatigue, decreased appetite).     Today is C3D1 lenvatinib /pembrolizumab .   She notes better tolerance with reduced-dose lenvatinib .     Lab studies and clinical findings are appropriate for treatment today.     Issues addressed today:   Blood pressure- increase in systolic pressure with start of lenvatinib . Was previously on amolidipine 2.5 mg but this was discontinued due to concern for episodes of hypotension.  Will restart amlodipine  at 2.5 mg daily and she will continue to monitor her BP at home.  Thyroid dysfunction- TSH 8.01, fT4 0.77; unclear if this is 2/2 lenvatinib  or pembroluzimab- start levothyroxine  25 mcg daily  UTI- rx Keflex  500 mg BID x 5 days  Elevated creatinine- UA+ protein 30 mg/dL; creatinine 8.82 - CTM  Low serum potassium- 3.3 today- repletion in infusion center. If continues to be low may consider home prescription.     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 query worsened with pembro- no issues today- was treated with low-dose prednisone - now off steroids and resolved.  Cough/SOB- likely secondary to disease burden, updated CT chest 01/26/23- only slight enlargement of pulmonary nodules; no evidence of pneumonitis; benzonatate   prn- improved  Elevated creatinine   Elevated TSH; normal fT4- start levothyroxine  25 mcg daily      INTERVAL HISTORY:    Energy level and appetite improved with dose-reduced lenvatinib .  Denies new or worsening pain.   Cough is mildly improved.   Denies fevers, chills, sweats, chest pain or worsening shortness of breath.   Denies issues with bowel or bladder habits.   Denies dysuria or hematuria.      PAST MEDICAL/SURGICAL HX:  Past Medical History:   Diagnosis Date    GERD (gastroesophageal reflux disease)  HTN (hypertension)     Osteoarthritis     Pelvic mass      CURRENT MEDICATIONS:    Physical Exam  Constitutional:       General: She is not in acute distress.     Appearance: Normal appearance. She is not ill-appearing.   Cardiovascular:      Heart sounds: Normal heart sounds.   Pulmonary:      Effort: Pulmonary effort is normal.      Breath sounds: Normal breath sounds.   Musculoskeletal:         General: No swelling.   Skin:     General: Skin is warm and dry.   Neurological:      General: No focal deficit present.      Mental Status: She is alert and oriented to person, place, and time.   Psychiatric:         Mood and Affect: Mood normal.         Behavior: Behavior normal.     Social History:    She is married. She is retired from Agricultural consultant. She has two children, daughterGLENWOOD Weber lives nearby; son Robin Weber .      Lab on 02/16/2023   Component Date Value Ref Range Status    Color, UA 02/16/2023 Light Yellow   Final    Clarity, UA 02/16/2023 Clear   Final    Specific Gravity, UA 02/16/2023 1.011  1.003 - 1.030 Final    pH, UA 02/16/2023 7.0  5.0 - 9.0 Final    Leukocyte Esterase, UA 02/16/2023 Moderate (A)  Negative Final    Nitrite, UA 02/16/2023 Negative  Negative Final    Protein, UA 02/16/2023 30 mg/dL (A)  Negative Final    Glucose, UA 02/16/2023 Negative  Negative Final    Ketones, UA 02/16/2023 Negative  Negative Final    Urobilinogen, UA 02/16/2023 <2.0 mg/dL  <7.9 mg/dL Final    Bilirubin, UA 02/16/2023 Negative  Negative Final    Blood, UA 02/16/2023 Trace (A)  Negative Final    RBC, UA 02/16/2023 5 (H)  <=4 /HPF Final    WBC, UA 02/16/2023 7 (H)  0 - 5 /HPF Final    Squam Epithel, UA 02/16/2023 4  0 - 5 /HPF Final    Bacteria, UA 02/16/2023 None Seen  None Seen /HPF Final    CA 125 02/16/2023 58 (H)  0 - 35 U/mL Final    Sodium 02/16/2023 146 (H)  135 - 145 mmol/L Final    Potassium 02/16/2023 3.3 (L)  3.5 - 5.1 mmol/L Final    Chloride 02/16/2023 111 (H)  98 - 107 mmol/L Final    CO2 02/16/2023 26.0  20.0 - 31.0 mmol/L Final    Anion Gap 02/16/2023 9  5 - 14 mmol/L Final    BUN 02/16/2023 10  9 - 23 mg/dL Final    Creatinine 96/72/7975 1.17 (H)  0.55 - 1.02 mg/dL Final BUN/Creatinine Ratio 02/16/2023 9   Final    eGFR CKD-EPI (2021) Female 02/16/2023 51 (L)  >=60 mL/min/1.28m2 Final    eGFR calculated with CKD-EPI 2021 equation in accordance with [employer] and [employer] of Nephrology Task Force recommendations.    Glucose 02/16/2023 96  70 - 179 mg/dL Final    Calcium 96/72/7975 9.1  8.7 - 10.4 mg/dL Final    Albumin 96/72/7975 3.5  3.4 - 5.0 g/dL Final    Total Protein 02/16/2023 6.4  5.7 - 8.2 g/dL Final  Total Bilirubin 02/16/2023 0.4  0.3 - 1.2 mg/dL Final    AST 96/72/7975 21  <=34 U/L Final    ALT 02/16/2023 12  10 - 49 U/L Final    Alkaline Phosphatase 02/16/2023 71  46 - 116 U/L Final    TSH 02/16/2023 8.010 (H)  0.550 - 4.780 uIU/mL Final    Free T4 02/16/2023 0.77 (L)  0.89 - 1.76 ng/dL Final    Phosphorus 96/72/7975 2.4  2.4 - 5.1 mg/dL Final    WBC 96/72/7975 5.3  3.6 - 11.2 10*9/L Final    RBC 02/16/2023 3.37 (L)  3.95 - 5.13 10*12/L Final    HGB 02/16/2023 10.7 (L)  11.3 - 14.9 g/dL Final    HCT 96/72/7975 31.1 (L)  34.0 - 44.0 % Final    MCV 02/16/2023 92.1  77.6 - 95.7 fL Final    MCH 02/16/2023 31.8  25.9 - 32.4 pg Final    MCHC 02/16/2023 34.5  32.0 - 36.0 g/dL Final    RDW 96/72/7975 15.9 (H)  12.2 - 15.2 % Final    MPV 02/16/2023 7.3  6.8 - 10.7 fL Final    Platelet 02/16/2023 239  150 - 450 10*9/L Final    Neutrophils % 02/16/2023 69.7  % Final    Lymphocytes % 02/16/2023 18.3  % Final    Monocytes % 02/16/2023 7.6  % Final    Eosinophils % 02/16/2023 3.9  % Final    Basophils % 02/16/2023 0.5  % Final    Absolute Neutrophils 02/16/2023 3.7  1.8 - 7.8 10*9/L Final    Absolute Lymphocytes 02/16/2023 1.0 (L)  1.1 - 3.6 10*9/L Final    Absolute Monocytes 02/16/2023 0.4  0.3 - 0.8 10*9/L Final    Absolute Eosinophils 02/16/2023 0.2  0.0 - 0.5 10*9/L Final    Absolute Basophils 02/16/2023 0.0  0.0 - 0.1 10*9/L Final

## 2023-02-16 ENCOUNTER — Ambulatory Visit: Admit: 2023-02-16 | Discharge: 2023-02-16 | Payer: MEDICARE

## 2023-02-16 ENCOUNTER — Other Ambulatory Visit: Admit: 2023-02-16 | Discharge: 2023-02-16 | Payer: MEDICARE

## 2023-02-16 DIAGNOSIS — C549 Malignant neoplasm of corpus uteri, unspecified: Principal | ICD-10-CM

## 2023-02-16 DIAGNOSIS — R899 Unspecified abnormal finding in specimens from other organs, systems and tissues: Principal | ICD-10-CM

## 2023-02-16 DIAGNOSIS — R799 Abnormal finding of blood chemistry, unspecified: Principal | ICD-10-CM

## 2023-02-16 DIAGNOSIS — C801 Malignant (primary) neoplasm, unspecified: Principal | ICD-10-CM

## 2023-02-16 DIAGNOSIS — D8989 Other specified disorders involving the immune mechanism, not elsewhere classified: Principal | ICD-10-CM

## 2023-02-16 LAB — URINALYSIS WITH MICROSCOPY
BACTERIA: NONE SEEN /HPF
BILIRUBIN UA: NEGATIVE
GLUCOSE UA: NEGATIVE
KETONES UA: NEGATIVE
NITRITE UA: NEGATIVE
PH UA: 7 (ref 5.0–9.0)
PROTEIN UA: 30 — AB
RBC UA: 5 /HPF — ABNORMAL HIGH (ref <=4)
SPECIFIC GRAVITY UA: 1.011 (ref 1.003–1.030)
SQUAMOUS EPITHELIAL: 4 /HPF (ref 0–5)
UROBILINOGEN UA: <2
WBC UA: 7 /HPF — ABNORMAL HIGH (ref 0–5)

## 2023-02-16 LAB — COMPREHENSIVE METABOLIC PANEL
ALBUMIN: 3.5 g/dL (ref 3.4–5.0)
ALKALINE PHOSPHATASE: 71 U/L (ref 46–116)
ALT (SGPT): 12 U/L (ref 10–49)
ANION GAP: 9 mmol/L (ref 5–14)
AST (SGOT): 21 U/L (ref <=34)
BILIRUBIN TOTAL: 0.4 mg/dL (ref 0.3–1.2)
BLOOD UREA NITROGEN: 10 mg/dL (ref 9–23)
BUN / CREAT RATIO: 9
CALCIUM: 9.1 mg/dL (ref 8.7–10.4)
CHLORIDE: 111 mmol/L — ABNORMAL HIGH (ref 98–107)
CO2: 26 mmol/L (ref 20.0–31.0)
CREATININE: 1.17 mg/dL — ABNORMAL HIGH
EGFR CKD-EPI (2021) FEMALE: 51 mL/min/{1.73_m2} — ABNORMAL LOW (ref >=60)
GLUCOSE RANDOM: 96 mg/dL (ref 70–179)
POTASSIUM: 3.3 mmol/L — ABNORMAL LOW (ref 3.5–5.1)
PROTEIN TOTAL: 6.4 g/dL (ref 5.7–8.2)
SODIUM: 146 mmol/L — ABNORMAL HIGH (ref 135–145)

## 2023-02-16 LAB — CBC W/ AUTO DIFF
BASOPHILS ABSOLUTE COUNT: 0 10*9/L (ref 0.0–0.1)
BASOPHILS RELATIVE PERCENT: 0.5 %
EOSINOPHILS ABSOLUTE COUNT: 0.2 10*9/L (ref 0.0–0.5)
EOSINOPHILS RELATIVE PERCENT: 3.9 %
HEMATOCRIT: 31.1 % — ABNORMAL LOW (ref 34.0–44.0)
HEMOGLOBIN: 10.7 g/dL — ABNORMAL LOW (ref 11.3–14.9)
LYMPHOCYTES ABSOLUTE COUNT: 1 10*9/L — ABNORMAL LOW (ref 1.1–3.6)
LYMPHOCYTES RELATIVE PERCENT: 18.3 %
MEAN CORPUSCULAR HEMOGLOBIN CONC: 34.5 g/dL (ref 32.0–36.0)
MEAN CORPUSCULAR HEMOGLOBIN: 31.8 pg (ref 25.9–32.4)
MEAN CORPUSCULAR VOLUME: 92.1 fL (ref 77.6–95.7)
MEAN PLATELET VOLUME: 7.3 fL (ref 6.8–10.7)
MONOCYTES ABSOLUTE COUNT: 0.4 10*9/L (ref 0.3–0.8)
MONOCYTES RELATIVE PERCENT: 7.6 %
NEUTROPHILS ABSOLUTE COUNT: 3.7 10*9/L (ref 1.8–7.8)
NEUTROPHILS RELATIVE PERCENT: 69.7 %
PLATELET COUNT: 239 10*9/L (ref 150–450)
RED BLOOD CELL COUNT: 3.37 10*12/L — ABNORMAL LOW (ref 3.95–5.13)
RED CELL DISTRIBUTION WIDTH: 15.9 % — ABNORMAL HIGH (ref 12.2–15.2)
WBC ADJUSTED: 5.3 10*9/L (ref 3.6–11.2)

## 2023-02-16 LAB — PHOSPHORUS: PHOSPHORUS: 2.4 mg/dL (ref 2.4–5.1)

## 2023-02-16 LAB — T4, FREE: FREE T4: 0.77 ng/dL — ABNORMAL LOW (ref 0.89–1.76)

## 2023-02-16 LAB — CA 125: CA 125: 58 U/mL — ABNORMAL HIGH (ref 0–35)

## 2023-02-16 LAB — TSH: THYROID STIMULATING HORMONE: 8.01 u[IU]/mL — ABNORMAL HIGH (ref 0.550–4.780)

## 2023-02-16 MED ORDER — LEVOTHYROXINE 25 MCG TABLET
ORAL_TABLET | Freq: Every day | ORAL | 1 refills | 90 days | Status: CP
Start: 2023-02-16 — End: 2024-02-16

## 2023-02-16 MED ORDER — CEPHALEXIN 500 MG CAPSULE
ORAL_CAPSULE | Freq: Two times a day (BID) | ORAL | 0 refills | 5 days | Status: CP
Start: 2023-02-16 — End: 2023-02-21

## 2023-02-16 MED ORDER — AMLODIPINE 2.5 MG TABLET
ORAL_TABLET | Freq: Every day | ORAL | 2 refills | 30 days | Status: CP
Start: 2023-02-16 — End: 2024-02-16

## 2023-02-16 MED ADMIN — zoledronic acid (ZOMETA) 3.3 mg in sodium chloride (NS) 0.9 % 100 mL IVPB: 3.3 mg | INTRAVENOUS | @ 18:00:00 | Stop: 2023-02-16

## 2023-02-16 MED ADMIN — sodium chloride (NS) 0.9 % infusion: 100 mL/h | INTRAVENOUS | @ 16:00:00

## 2023-02-16 MED ADMIN — pembrolizumab (KEYTRUDA) 200 mg in sodium chloride (NS) 0.9 % 50 mL IVPB: 200 mg | INTRAVENOUS | @ 16:00:00 | Stop: 2023-02-16

## 2023-02-16 NOTE — Unmapped (Signed)
Patient arrived to chair 3.  No complaints noted.  Access of port intact with blood return.  Patient completed and tolerated treatment.  AVS given and patient discharged to home.

## 2023-02-16 NOTE — Unmapped (Signed)
If you feel like this is an emergency please call 911.  For appointments or questions Monday through Friday 8AM-5PM please call [phone] or Toll Free [phone]. For Medical questions or concerns ask for the Nurse Triage Line.  On Nights, Weekends, and Holidays call [phone] and ask for the Oncologist on Call.  Reasons to call the Nurse Triage Line:  Fever of 100.5 or greater  Nausea and/or vomiting not relived with nausea medicine  Diarrhea or constipation  Severe pain not relieved with usual pain regimen  Shortness of breath  Uncontrolled bleeding  Mental status changes  Lab on 02/16/2023   Component Date Value Ref Range Status    Color, UA 02/16/2023 Light Yellow   Final    Clarity, UA 02/16/2023 Clear   Final    Specific Gravity, UA 02/16/2023 1.011  1.003 - 1.030 Final    pH, UA 02/16/2023 7.0  5.0 - 9.0 Final    Leukocyte Esterase, UA 02/16/2023 Moderate (A)  Negative Final    Nitrite, UA 02/16/2023 Negative  Negative Final    Protein, UA 02/16/2023 30 mg/dL (A)  Negative Final    Glucose, UA 02/16/2023 Negative  Negative Final    Ketones, UA 02/16/2023 Negative  Negative Final    Urobilinogen, UA 02/16/2023 <2.0 mg/dL  <3.6 mg/dL Final    Bilirubin, UA 02/16/2023 Negative  Negative Final    Blood, UA 02/16/2023 Trace (A)  Negative Final    RBC, UA 02/16/2023 5 (H)  <=4 /HPF Final    WBC, UA 02/16/2023 7 (H)  0 - 5 /HPF Final    Squam Epithel, UA 02/16/2023 4  0 - 5 /HPF Final    Bacteria, UA 02/16/2023 None Seen  None Seen /HPF Final    CA 125 02/16/2023 58 (H)  0 - 35 U/mL Final    Sodium 02/16/2023 146 (H)  135 - 145 mmol/L Final    Potassium 02/16/2023 3.3 (L)  3.5 - 5.1 mmol/L Final    Chloride 02/16/2023 111 (H)  98 - 107 mmol/L Final    CO2 02/16/2023 26.0  20.0 - 31.0 mmol/L Final    Anion Gap 02/16/2023 9  5 - 14 mmol/L Final    BUN 02/16/2023 10  9 - 23 mg/dL Final    Creatinine 64/40/3474 1.17 (H)  0.55 - 1.02 mg/dL Final    BUN/Creatinine Ratio 02/16/2023 9   Final    eGFR CKD-EPI (2021) Female 02/16/2023 51 (L)  >=60 mL/min/1.70m2 Final    eGFR calculated with CKD-EPI 2021 equation in accordance with [employer] and [employer] of Nephrology Task Force recommendations.    Glucose 02/16/2023 96  70 - 179 mg/dL Final    Calcium 25/95/6387 9.1  8.7 - 10.4 mg/dL Final    Albumin 56/43/3295 3.5  3.4 - 5.0 g/dL Final    Total Protein 02/16/2023 6.4  5.7 - 8.2 g/dL Final    Total Bilirubin 02/16/2023 0.4  0.3 - 1.2 mg/dL Final    AST 18/84/1660 21  <=34 U/L Final    ALT 02/16/2023 12  10 - 49 U/L Final    Alkaline Phosphatase 02/16/2023 71  46 - 116 U/L Final    TSH 02/16/2023 8.010 (H)  0.550 - 4.780 uIU/mL Final    Free T4 02/16/2023 0.77 (L)  0.89 - 1.76 ng/dL Final    Phosphorus 63/11/6008 2.4  2.4 - 5.1 mg/dL Final    WBC 93/23/5573 5.3  3.6 - 11.2 10*9/L Final    RBC  02/16/2023 3.37 (L)  3.95 - 5.13 10*12/L Final    HGB 02/16/2023 10.7 (L)  11.3 - 14.9 g/dL Final    HCT 16/08/9603 31.1 (L)  34.0 - 44.0 % Final    MCV 02/16/2023 92.1  77.6 - 95.7 fL Final    MCH 02/16/2023 31.8  25.9 - 32.4 pg Final    MCHC 02/16/2023 34.5  32.0 - 36.0 g/dL Final    RDW 54/07/8118 15.9 (H)  12.2 - 15.2 % Final    MPV 02/16/2023 7.3  6.8 - 10.7 fL Final    Platelet 02/16/2023 239  150 - 450 10*9/L Final    Neutrophils % 02/16/2023 69.7  % Final    Lymphocytes % 02/16/2023 18.3  % Final    Monocytes % 02/16/2023 7.6  % Final    Eosinophils % 02/16/2023 3.9  % Final    Basophils % 02/16/2023 0.5  % Final    Absolute Neutrophils 02/16/2023 3.7  1.8 - 7.8 10*9/L Final    Absolute Lymphocytes 02/16/2023 1.0 (L)  1.1 - 3.6 10*9/L Final    Absolute Monocytes 02/16/2023 0.4  0.3 - 0.8 10*9/L Final    Absolute Eosinophils 02/16/2023 0.2  0.0 - 0.5 10*9/L Final    Absolute Basophils 02/16/2023 0.0  0.0 - 0.1 10*9/L Final

## 2023-02-21 ENCOUNTER — Ambulatory Visit: Admit: 2023-02-21 | Discharge: 2023-02-22 | Payer: MEDICARE

## 2023-02-21 MED ADMIN — heparin, porcine (PF) 100 unit/mL injection 500 Units: 500 [IU] | INTRAVENOUS | @ 18:00:00

## 2023-02-21 MED ADMIN — iohexol (OMNIPAQUE) 350 mg iodine/mL solution 100 mL: 100 mL | INTRAVENOUS | @ 18:00:00 | Stop: 2023-02-21

## 2023-02-22 NOTE — Unmapped (Signed)
Specialty Medication Follow-up    Robin Weber is a 71 y.o. female with recurrent endometrial cancer who I am seeing for follow up on their treatment with lenvatinib + pembrolizumab.     Chemotherapy: Lenvatinib 14 mg PO daily + Pembrolizumab 200 mg IV q3wk  Start date: Cycle 2 Day 1 (02/16/23)    A/P:   1. Oral Chemotherapy: CBC w/diff and CMP reviewed. Grade 1 anemia which is stable. Grade 1 serum creatinine elevation which has improved. Grade 1 hypothyroidism which is new. Grade 2 hypertension which has worsened. No grade 3 toxicities therefore will continue at current dose intensity. Will repeat labs prior to next infusion.   Continue lenvatinib 14 mg PO daily (started 02/07/23)  Obtain labs prior to next infusion    2. Cough:  Given some benefit with benzonatate will continue as needed. Will continue to monitor cough.   Continue benzonatate 10 mg PO TID PRN cough    3. Hypertension: Grade 2 which has worsened and is now uncontrolled. Given elevated blood pressure will start antihypertensive therapy with amlodipine 2.5 mg PO daily which the patient was previously on.   Start amlodipine 2.5 mg PO daily  Continue to monitor home BP    4. Hypothyroidism: Grade 1 which has worsened and given low FT4 will start levothyroxine and reassess in 3-6 weeks.   Start levothyroxine 25 mcg PO daily    I spent approximately 15 minutes in direct patient care.    Next follow up: In 1 week for toxicity assessment (02/23/23)    Referring physician: Dr. Valeda Malm, PharmD, BCOP, CPP  Gynecologic Oncology Clinic Pharmacist  Pager: [phone]    S/O: Robin Weber presents to clinic prior to infusion. Her blood pressure upon presentation was 150/60 mmHg and upon repeat 149/79 mmHg. She reports the following blood pressures while at home: 153/70 mmhg, 144/77 mmHG, 147/69 mmHg, 172/74 mmHg, and 152/67 mmHg. She reports that this lower dose of lenvatinib is much better tolerated. She still endorses a cough but states that the benzonatate capsules are somewhat helpful. She denies rash, SOB, diarrhea, or joint pain.     Medications reviewed and updated in EPIC? no    Missed doses: ----    Labs  Lab on 02/16/2023   Component Date Value Ref Range Status    Color, UA 02/16/2023 Light Yellow   Final    Clarity, UA 02/16/2023 Clear   Final    Specific Gravity, UA 02/16/2023 1.011  1.003 - 1.030 Final    pH, UA 02/16/2023 7.0  5.0 - 9.0 Final    Leukocyte Esterase, UA 02/16/2023 Moderate (A)  Negative Final    Nitrite, UA 02/16/2023 Negative  Negative Final    Protein, UA 02/16/2023 30 mg/dL (A)  Negative Final    Glucose, UA 02/16/2023 Negative  Negative Final    Ketones, UA 02/16/2023 Negative  Negative Final    Urobilinogen, UA 02/16/2023 <2.0 mg/dL  <4.5 mg/dL Final    Bilirubin, UA 02/16/2023 Negative  Negative Final    Blood, UA 02/16/2023 Trace (A)  Negative Final    RBC, UA 02/16/2023 5 (H)  <=4 /HPF Final    WBC, UA 02/16/2023 7 (H)  0 - 5 /HPF Final    Squam Epithel, UA 02/16/2023 4  0 - 5 /HPF Final    Bacteria, UA 02/16/2023 None Seen  None Seen /HPF Final    CA 125 02/16/2023 58 (H)  0 - 35 U/mL Final  Sodium 02/16/2023 146 (H)  135 - 145 mmol/L Final    Potassium 02/16/2023 3.3 (L)  3.5 - 5.1 mmol/L Final    Chloride 02/16/2023 111 (H)  98 - 107 mmol/L Final    CO2 02/16/2023 26.0  20.0 - 31.0 mmol/L Final    Anion Gap 02/16/2023 9  5 - 14 mmol/L Final    BUN 02/16/2023 10  9 - 23 mg/dL Final    Creatinine 16/08/9603 1.17 (H)  0.55 - 1.02 mg/dL Final    BUN/Creatinine Ratio 02/16/2023 9   Final    eGFR CKD-EPI (2021) Female 02/16/2023 51 (L)  >=60 mL/min/1.74m2 Final    eGFR calculated with CKD-EPI 2021 equation in accordance with [employer] and [employer] of Nephrology Task Force recommendations.    Glucose 02/16/2023 96  70 - 179 mg/dL Final    Calcium 54/07/8118 9.1  8.7 - 10.4 mg/dL Final    Albumin 14/78/2956 3.5  3.4 - 5.0 g/dL Final    Total Protein 02/16/2023 6.4  5.7 - 8.2 g/dL Final    Total Bilirubin 02/16/2023 0.4  0.3 - 1.2 mg/dL Final    AST 21/30/8657 21  <=34 U/L Final    ALT 02/16/2023 12  10 - 49 U/L Final    Alkaline Phosphatase 02/16/2023 71  46 - 116 U/L Final    TSH 02/16/2023 8.010 (H)  0.550 - 4.780 uIU/mL Final    Free T4 02/16/2023 0.77 (L)  0.89 - 1.76 ng/dL Final    Phosphorus 84/69/6295 2.4  2.4 - 5.1 mg/dL Final    WBC 28/41/3244 5.3  3.6 - 11.2 10*9/L Final    RBC 02/16/2023 3.37 (L)  3.95 - 5.13 10*12/L Final    HGB 02/16/2023 10.7 (L)  11.3 - 14.9 g/dL Final    HCT 11/24/7251 31.1 (L)  34.0 - 44.0 % Final    MCV 02/16/2023 92.1  77.6 - 95.7 fL Final    MCH 02/16/2023 31.8  25.9 - 32.4 pg Final    MCHC 02/16/2023 34.5  32.0 - 36.0 g/dL Final    RDW 66/44/0347 15.9 (H)  12.2 - 15.2 % Final    MPV 02/16/2023 7.3  6.8 - 10.7 fL Final    Platelet 02/16/2023 239  150 - 450 10*9/L Final    Neutrophils % 02/16/2023 69.7  % Final    Lymphocytes % 02/16/2023 18.3  % Final    Monocytes % 02/16/2023 7.6  % Final    Eosinophils % 02/16/2023 3.9  % Final    Basophils % 02/16/2023 0.5  % Final    Absolute Neutrophils 02/16/2023 3.7  1.8 - 7.8 10*9/L Final    Absolute Lymphocytes 02/16/2023 1.0 (L)  1.1 - 3.6 10*9/L Final    Absolute Monocytes 02/16/2023 0.4  0.3 - 0.8 10*9/L Final    Absolute Eosinophils 02/16/2023 0.2  0.0 - 0.5 10*9/L Final    Absolute Basophils 02/16/2023 0.0  0.0 - 0.1 10*9/L Final         Gynecologic Oncology    Gynecologic Oncology Metrics:      Dose and Schedule: Lenvatinib 14 mg PO daily + Pembrolizumab 200 mg IV q3wk     Chemotherapy Dose: Dose Documented     Chemotherapy Schedule: Schedule documented     NCI CTCAE: Anemia - Grade 1, Serum creatinine - Grade 1, Hypothyroid - Grade 1, Hypertension - Grade 2        Interventions: Additional agent prescribed for side effect management  Comments:  Amlodipine 2.5 mg PO daily  Levothyroxine 25 mcg PO daily

## 2023-02-23 NOTE — Unmapped (Signed)
Specialty Medication Follow-up    Robin Weber is a 72 y.o. female with recurrent endometrial cancer who I am seeing for follow up on their treatment with lenvatinib + pembrolizumab.     Chemotherapy: Lenvatinib 14 mg PO daily + Pembrolizumab 200 mg IV q3wk  Start date: Cycle 2 Day 1 (02/16/23)    A/P:   1. Oral Chemotherapy: No new labs to review. ***  Continue lenvatinib 14 mg PO daily (started 02/07/23)  Obtain labs prior to next infusion    2. Cough:  ***  Continue benzonatate 10 mg PO TID PRN cough    3. Hypertension: Grade ***  *** amlodipine 2.5 mg PO daily  Continue to monitor home BP      I spent approximately *** minutes in direct patient care.    Next follow up: In 2 weeks prior to next infusion (03/09/23) ***    Referring physician: Dr. Valeda Malm, PharmD, BCOP, CPP  Gynecologic Oncology Clinic Pharmacist  Pager: [phone]    S/O: [name] was contacted via telephone regarding oral chemotherapy toxicity assessment. BP *** NV *** Appetite *** Energy *** Joint pain *** Diarrhea *** Cough ***     Medications reviewed and updated in EPIC? no    Missed doses: ----    Labs (no new labs)      Gynecologic Oncology    Gynecologic Oncology Metrics: coughing up white foamy saliva. Additionally, she has the sensation that something is caught in her throat. She is not currently taking anything for acid reflux. She reports that appetite is ok.     Medications reviewed and updated in EPIC? no    Missed doses: ----    Labs (no new labs)      Gynecologic Oncology    Gynecologic Oncology Metrics:      Dose and Schedule: Lenvatinib 14 mg PO daily + Pembrolizumab 200 mg IV q3wk     Chemotherapy Dose: Dose Documented     Chemotherapy Schedule: Schedule documented     NCI CTCAE: Hypertension - Grade 2        Interventions: Additional agent prescribed for side effect management  Comments: Restart famotidine 20 mg PO BID

## 2023-02-25 NOTE — Unmapped (Signed)
[facility] Refill Coordination Note    Specialty Medication(s) to be Shipped:   Hematology/Oncology: Robin Weber 14mg /day    Other medication(s) to be shipped: No additional medications requested for fill at this time     Robin Weber, DOB: 1951/04/09  Phone: [phone] (home)       All above HIPAA information was verified with patient.     Was a Nurse, learning disability used for this call? No    Completed refill call assessment today to schedule patient's medication shipment from the [facility] [phone]).  All relevant notes have been reviewed.     Specialty medication(s) and dose(s) confirmed: Regimen is correct and unchanged.   Changes to medications: Robin Weber reports no changes at this time.  Changes to insurance: No  New side effects reported not previously addressed with a pharmacist or physician: None reported  Questions for the pharmacist: No    Confirmed patient received a Conservation officer, historic buildings and a Surveyor, mining with first shipment. The patient will receive a drug information handout for each medication shipped and additional FDA Medication Guides as required.       DISEASE/MEDICATION-SPECIFIC INFORMATION        N/A    SPECIALTY MEDICATION ADHERENCE     Medication Adherence    Patient reported X missed doses in the last month: 0  Specialty Medication: LENVIMA 14 mg/day(10 mg x 1-4 mg x 1) Cap (lenvatinib)  Patient is on additional specialty medications: No  Informant: patient              Were doses missed due to medication being on hold? No    Lenvima 14  mg/day : 14 days of medicine on hand       REFERRAL TO PHARMACIST     Referral to the pharmacist: Not needed      [facility]     Shipping address confirmed in Epic.     Patient was notified of new phone menu : Yes    Delivery Scheduled: Yes, Expected medication delivery date: 03/07/23.     Medication will be delivered via Same Day Courier to the prescription address in Epic WAM.    Robin Weber   [facility] Specialty Technician

## 2023-02-28 ENCOUNTER — Telehealth: Payer: Self-pay

## 2023-02-28 NOTE — Telephone Encounter (Signed)
312 pm.  Phone call made to patient to follow up on status and offer a home visit.  No answer.  Message left requesting a call back.

## 2023-03-03 NOTE — Unmapped (Signed)
Specialty Medication Follow-up    Robin Weber is a 72 y.o. female with recurrent endometrial cancer who I am seeing for follow up on their treatment with lenvatinib + pembrolizumab.     Chemotherapy: Lenvatinib 14 mg PO daily + Pembrolizumab 200 mg IV q3wk  Start date: Cycle 2 Day 1 (02/16/23)    A/P:   1. Oral Chemotherapy: No new labs to review. Grade 1 hypertension and cough both of which have improved. No grade 3 toxicities therefore will continue at current dose intensity. Will repeat labs prior to next infusion.   Continue lenvatinib 14 mg PO daily (started 02/07/23)  Obtain labs prior to next infusion    2. Cough:  Has improved with the addition of famotidine but will continue to monitor.   Continue benzonatate 10 mg PO TID PRN cough  Continue famotidine 20 mg PO BID    3. Hypertension: Grade 1 which is stable and within goal despite not starting amlodipine therapy. Will hold amlodipine therapy and continue to monitor.    HOLD amlodipine 2.5 mg PO daily  Continue to monitor home BP      I spent approximately 5 minutes in direct patient care.    Next follow up: In 1 week prior to infusion    Referring physician: Dr. Valeda Malm, PharmD, BCOP, CPP  Gynecologic Oncology Clinic Pharmacist  Pager: [phone]    S/O: Ms. Gotch was contacted via telephone regarding oral chemotherapy toxicity assessment. She reports that her blood pressure has been good recently. Today's blood pressure was 120/64 mmHg. She endorses that the cough remains present but the sensation of something being stuck in her throat has improved. She is able to maintain good oral intake.      Medications reviewed and updated in EPIC? no    Missed doses: ----    Labs (no new labs)      Gynecologic Oncology    Gynecologic Oncology Metrics:      Dose and Schedule: Lenvatinib 14 mg PO daily + Pembrolizumab 200 mg IV q3wk     Chemotherapy Dose: Dose Documented     Chemotherapy Schedule: Schedule documented     NCI CTCAE: Hypertension - Grade 1

## 2023-03-07 MED FILL — LENVIMA 14 MG/DAY(10 MG X 1-4 MG X 1) CAPSULE: ORAL | 30 days supply | Qty: 60 | Fill #1

## 2023-03-09 ENCOUNTER — Ambulatory Visit: Admit: 2023-03-09 | Discharge: 2023-03-09 | Payer: MEDICARE

## 2023-03-09 ENCOUNTER — Other Ambulatory Visit: Admit: 2023-03-09 | Discharge: 2023-03-09 | Payer: MEDICARE

## 2023-03-09 DIAGNOSIS — C549 Malignant neoplasm of corpus uteri, unspecified: Principal | ICD-10-CM

## 2023-03-09 DIAGNOSIS — D8989 Other specified disorders involving the immune mechanism, not elsewhere classified: Principal | ICD-10-CM

## 2023-03-09 DIAGNOSIS — T451X5A Adverse effect of antineoplastic and immunosuppressive drugs, initial encounter: Principal | ICD-10-CM

## 2023-03-09 DIAGNOSIS — C801 Malignant (primary) neoplasm, unspecified: Principal | ICD-10-CM

## 2023-03-09 DIAGNOSIS — R799 Abnormal finding of blood chemistry, unspecified: Principal | ICD-10-CM

## 2023-03-09 DIAGNOSIS — R899 Unspecified abnormal finding in specimens from other organs, systems and tissues: Principal | ICD-10-CM

## 2023-03-09 LAB — CBC W/ AUTO DIFF
BASOPHILS ABSOLUTE COUNT: 0 10*9/L (ref 0.0–0.1)
BASOPHILS RELATIVE PERCENT: 0.7 %
EOSINOPHILS ABSOLUTE COUNT: 0.3 10*9/L (ref 0.0–0.5)
EOSINOPHILS RELATIVE PERCENT: 5.8 %
HEMATOCRIT: 36.8 % (ref 34.0–44.0)
HEMOGLOBIN: 12.5 g/dL (ref 11.3–14.9)
LYMPHOCYTES ABSOLUTE COUNT: 0.9 10*9/L — ABNORMAL LOW (ref 1.1–3.6)
LYMPHOCYTES RELATIVE PERCENT: 19.3 %
MEAN CORPUSCULAR HEMOGLOBIN CONC: 33.9 g/dL (ref 32.0–36.0)
MEAN CORPUSCULAR HEMOGLOBIN: 31.7 pg (ref 25.9–32.4)
MEAN CORPUSCULAR VOLUME: 93.5 fL (ref 77.6–95.7)
MEAN PLATELET VOLUME: 7.4 fL (ref 6.8–10.7)
MONOCYTES ABSOLUTE COUNT: 0.4 10*9/L (ref 0.3–0.8)
MONOCYTES RELATIVE PERCENT: 9.1 %
NEUTROPHILS ABSOLUTE COUNT: 3 10*9/L (ref 1.8–7.8)
NEUTROPHILS RELATIVE PERCENT: 65.1 %
PLATELET COUNT: 230 10*9/L (ref 150–450)
RED BLOOD CELL COUNT: 3.94 10*12/L — ABNORMAL LOW (ref 3.95–5.13)
RED CELL DISTRIBUTION WIDTH: 17.5 % — ABNORMAL HIGH (ref 12.2–15.2)
WBC ADJUSTED: 4.7 10*9/L (ref 3.6–11.2)

## 2023-03-09 LAB — COMPREHENSIVE METABOLIC PANEL
ALBUMIN: 3.9 g/dL (ref 3.4–5.0)
ALKALINE PHOSPHATASE: 71 U/L (ref 46–116)
ALT (SGPT): 17 U/L (ref 10–49)
ANION GAP: 10 mmol/L (ref 5–14)
AST (SGOT): 24 U/L (ref <=34)
BILIRUBIN TOTAL: 0.9 mg/dL (ref 0.3–1.2)
BLOOD UREA NITROGEN: 10 mg/dL (ref 9–23)
BUN / CREAT RATIO: 9
CALCIUM: 9.9 mg/dL (ref 8.7–10.4)
CHLORIDE: 109 mmol/L — ABNORMAL HIGH (ref 98–107)
CO2: 26 mmol/L (ref 20.0–31.0)
CREATININE: 1.15 mg/dL — ABNORMAL HIGH
EGFR CKD-EPI (2021) FEMALE: 52 mL/min/{1.73_m2} — ABNORMAL LOW (ref >=60)
GLUCOSE RANDOM: 105 mg/dL (ref 70–179)
POTASSIUM: 3.3 mmol/L — ABNORMAL LOW (ref 3.5–5.1)
PROTEIN TOTAL: 6.9 g/dL (ref 5.7–8.2)
SODIUM: 145 mmol/L (ref 135–145)

## 2023-03-09 LAB — CA 125: CA 125: 37 U/mL — ABNORMAL HIGH (ref 0–35)

## 2023-03-09 LAB — PHOSPHORUS: PHOSPHORUS: 2.9 mg/dL (ref 2.4–5.1)

## 2023-03-09 MED ORDER — CODEINE 10 MG-GUAIFENESIN 100 MG/5 ML ORAL LIQUID
Freq: Three times a day (TID) | ORAL | 0 refills | 8 days | Status: CP | PRN
Start: 2023-03-09 — End: ?

## 2023-03-09 NOTE — Unmapped (Signed)
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 Following cycle#1 of carbo/taxol on 11/18/21 she was admitted on 11/20/2021 for dysuria and left groin pain with the etiology due to a destructive osseous lytic lesion in her left inferior pubic ramus. Had palliative radiation to the bony lesion completed on 1/10. Received cycle#2 on 12/09/21 prior to discharge to SNF. Her course was complicated by urinary retention due to tumor burden with failed voiding trials.      Adenocarcinoma (CMS-HCC)   11/04/2021 Initial Diagnosis    Adenocarcinoma (CMS-HCC)     11/18/2021 - 06/09/2022 Chemotherapy    OP GYN PACLITAXEL/CARBOPLATIN  PACLitaxel 175 mg/m2 IV on day 1, CARBOplatin AUC6 IV on day 1, every 21 days     07/02/2022 -  Chemotherapy    OP PEMBROLIZUMAB 200 MG Q3W  Pembrolizumab 200 mg IV on Day 1  21-day cycle     Malignant neoplasm of corpus uteri, unspecified (CMS-HCC)   06/29/2022 Initial Diagnosis    Malignant neoplasm of corpus uteri, unspecified (CMS-HCC)     07/02/2022 -  Chemotherapy    OP PEMBROLIZUMAB 200 MG Q3W  Pembrolizumab 200 mg IV on Day 1  21-day cycle     09/10/2022 Surgery    Procedure on 09/10/22:  Robotic-assisted total laparoscopic hysterectomy, left salpingo-oophorectomy, appendectomy      Operative Findings:  Bimanual exam without palpable adnexal masses, small mobile uterus. Normal appearing cervix without lesions. Upper abdominal survey normal. Mild intraperitoneal adhesive disease, including a thick adhesion of the transverse colon to the anterior abdominal wall and adhesion of the appendix to the right uterine cornua necessitating appendectomy. No evidence of residual intraperitoneal disease.      09/22/2022 -  Cancer Staged    Cancer Staging   No matching staging information was found for the patient.       09/22/2022 Tumor Board    FIGO Stage: IVB endometrial endometrioid adenocarcinoma, FIGO grade 2, extensive +LVSI, MMRi, p53 WT; s/p NACT followed by 3 cycles single-agent pembrolizumab and interval palliative robotic hysterectomy.  Plan:   - Continue maintenance pembrolizumab.  - Obtain ER/PR staining.     Sources:   Eskander et al. (2023) Pembrolizumab plus chemotherapy in advanced endometrial cancer (AV409). NEJM.      09/22/2022 -  Cancer Staged    Staging form: Corpus Uteri - Carcinoma and Carcinosarcoma, AJCC 8th Edition  - Clinical stage from 09/22/2022: FIGO Stage IVB - Signed by Bary Leriche, MD on 09/23/2022       12/07/2022 -  Other    Foundation1:  AKT1 (Pathogenic)  CTNNB1 (Pathogenic)         INTERVAL HISTORY:  Patient presents with her husband today. She endorses that since speaking with Lexi she has had worsening of her cough and SOB. She endorses it is worst in the AM and that she is having trouble taking deep breaths. Very fatigued, limiting her ADLs. She denies any nausea/vomiting/abdominal pain, denies any new onset vaginal bleeding or changes in bladder/bowels.     PAST MEDICAL/SURGICAL HX:  Past Medical History:   Diagnosis Date    GERD (gastroesophageal reflux disease)     HTN (hypertension)     Osteoarthritis     Pelvic mass        Past  Surgical History:   Procedure Laterality Date    CESAREAN SECTION  1988    IR INSERT PORT AGE GREATER THAN 5 YRS  11/12/2021    IR INSERT PORT AGE GREATER THAN 5 YRS 11/12/2021 Ammie Dalton, MD IMG VIR [facility]    PR APPENDECTOMY,W OTHR PROC N/A 09/10/2022    Procedure: APPENDECTOMY; WHEN DONE FOR INDICATED PURPOSE @ TIME OF OTHER MAJOR PROCEDURE;  Surgeon: Randa Ngo, MD;  Location: MAIN OR [facility];  Service: Gynecology Oncology    PR BIOPSY OF VAGINA,SIMPLE N/A 10/27/2021    Procedure: BIOPSY OF VAGINAL MUCOSA; SIMPLE (SEPARATE PROCEDURE);  Surgeon: Gaynelle Cage, MD;  Location: [facility];  Service: Family Planning    PR LAPAROSCOPY W TOT HYSTERECTUTERUS <=250 Gay Filler TUBE/OVARY Bilateral 09/10/2022    Procedure: ROBOTIC TOTAL HYSTERECTOMY WITH LEFT SALPINGOOPHORECTOMY, RIGHT TUBE AND OVARY ABSENT;  Surgeon: Randa Ngo, MD;  Location: MAIN OR [facility];  Service: Gynecology Oncology       Family History   Problem Relation Age of Onset    Anesthesia problems Neg Hx     Bleeding Disorder Neg Hx        Social History     Socioeconomic History    Marital status: Married     Spouse name: None    Number of children: None    Years of education: None    Highest education level: None   Tobacco Use    Smoking status: Never    Smokeless tobacco: Never   Vaping Use    Vaping status: Never Used   Substance and Sexual Activity    Alcohol use: Not Currently    Drug use: Never     Social Determinants of Health     Financial Resource Strain: Low Risk  (10/27/2021)    Overall Financial Resource Strain (CARDIA)     Difficulty of Paying Living Expenses: Not hard at all   Food Insecurity: No Food Insecurity (10/27/2021)    Hunger Vital Sign     Worried About Running Out of Food in the Last Year: Never true     Ran Out of Food in the Last Year: Never true   Transportation Needs: No Transportation Needs (10/27/2021)    PRAPARE - Therapist, art (Medical): No     Lack of Transportation (Non-Medical): No       CURRENT MEDICATIONS:    Current Outpatient Medications:     amlodipine (NORVASC) 2.5 MG tablet, Take 1 tablet (2.5 mg total) by mouth daily., Disp: 30 tablet, Rfl: 2    apixaban (ELIQUIS) 5 mg Tab, Take 1 tablet (5 mg total) by mouth Two (2) times a day., Disp: 60 tablet, Rfl: 5    benzonatate (TESSALON PERLES) 100 MG capsule, Take 1 capsule (100 mg total) by mouth Three (3) times a day as needed for cough., Disp: 30 capsule, Rfl: 1    chlorhexidine (PERIDEX) 0.12 % solution, 15 mL by Oromucosal route two (2) times a day., Disp: , Rfl:     codeine-guaiFENesin (GUAIFENESIN AC) 10-100 mg/5 mL liquid, Take 5 mL by mouth Three (3) times a day as needed for cough., Disp: 118 mL, Rfl: 0    DULoxetine (CYMBALTA) 20 MG capsule, Take 1 capsule (20 mg total) by mouth daily., Disp: 30 capsule, Rfl: 3    gabapentin (NEURONTIN) 300 MG capsule, Please take 1 capsule (300 mg) in the morning and 2 capsules (600 mg) in the evening, Disp:  90 capsule, Rfl: 2    latanoprost (XALATAN) 0.005 % ophthalmic solution, Administer 1 drop to both eyes nightly., Disp: , Rfl:     lenvatinib 14 mg/day (10 mg x 1 AND 4 mg x 1) cap, Take one 10mg  capsule plus one 4mg  capsule  (14 mg total) by mouth daily., Disp: 60 capsule, Rfl: 5    levothyroxine (SYNTHROID) 25 MCG tablet, Take 1 tablet (25 mcg total) by mouth daily., Disp: 90 tablet, Rfl: 1    lidocaine-prilocaine (EMLA) 2.5-2.5 % cream, Apply topically daily as needed. Prior to port access, Disp: 30 g, Rfl: 2    NON FORMULARY, Nature Made Iron Gummy - two gummies once a day, Disp: , Rfl:     NON FORMULARY, Nature Made Vitamin B12 Gummy - two gummies once a day, Disp: , Rfl:     ondansetron (ZOFRAN-ODT) 4 MG disintegrating tablet, Take 1 tablet (4 mg total) by mouth every eight (8) hours as needed for nausea., Disp: 30 tablet, Rfl: 2    polyethylene glycol (MIRALAX) 17 gram packet, Take 17 g by mouth Two (2) times a day. (Patient taking differently: Take 17 g by mouth daily.), Disp: , Rfl:     senna (SENOKOT) 8.6 mg tablet, Take 2 tablets by mouth daily., Disp: 60 tablet, Rfl: 5    timolo/brimon/dorzo/latanop/PF (TIMOL-BRIMON-DORZO-LATANOP,PF,) 0.5 %-0.15 %- 2 %-0.005 % Drop, Apply 1 drop to eye two (2) times a day., Disp: , Rfl:     REVIEW OF SYSTEMS:  Complete 10-system review is negative except as above in Interval History.    PHYSICAL EXAM:  BP 127/72  - Pulse 66  - Temp 36.3 ??C (97.4 ??F) (Temporal)  - Resp 16  - Wt 64.9 kg (143 lb 1.6 oz)  - SpO2 99%  - BMI 27.95 kg/m??   General: Alert, oriented, no acute distress.  HEENT: Sclera anicteric. Dental abscess healed.  Chest: Port site clean. Decreased BS in bilateral lower lobes.  Cardiovascular: Regular rate and rhythm, no murmurs.  Abdomen: Soft, non-tender, non-distended  Extremities: Grossly normal range of motion.  Warm, well perfused.      LABORATORY AND RADIOLOGIC STUDIES:  CA 125 37 today, last 58    Lab Results   Component Value Date    WBC 4.7 03/09/2023    HGB 12.5 03/09/2023    HCT 36.8 03/09/2023    PLT 230 03/09/2023     CT A/P Performed on 02/21/2023  Impression  Left common iliac lymph node conglomerate mildly decreased in size.    Similar pelvic osseous metastases.    Nonspecific subcentimeter nodule in the left anterior omentum, not seen on prior. Recommend close attention on follow-up.    Please see concurrent same day CT chest for findings above the diaphragm including numerous pulmonary nodules.      ASSESSMENT AND PLAN:  Robin Weber is a 72 y.o. woman with Stage IVB uterine cancer (MMR intact, p53wt, presumed uterine origin based on histology) treated with 6 cycles of carbo/taxol followed by 3 additional cycles of carbo/taxol [place] (last platinum 05/2022) with progression at cycle #8 keytruda maintenance so transitioned to Len/Pem now with evidence of continued progression.      1) Uterine Cancer: ECOG1.  Imaging in January with concern for progression on single agent pembro and switched to Len/Pem. Required DR to 10mg  Lenvima for increased fatigue and SOB. Foundation1 with AKT mutation suggesting may respond to Letrozole/Everolimus as next line therapy but given progressive pulmonary symptoms will switch to Doxil  for control and then consider this regimen which can take longer to see effect. Her abdominal disease appears to be responding with downtrending CA 125 and April imaging but will obtain urgent repeat chest imaging on 4/22. Future options include candidate for ONC201 trial. HER2 testing sent with 2+ so candidate for Enhertu per destiny trial but risk of pneumonitis. Plan for echo and Doxil starting in 1 week after repeat lung imaging. Doxil teaching today with MD and reinforced with pharmacist. PPE specifically reviewed as well as echo.    2) Cancer associated pain: Stable. Palliative managing.  3) Chemotherapy associated nausea: Did well with prophylactic steroids. No issues now.    4) ACP: Reviewed potential and pending risk of pulmonary compromise and discussed that intubation would likely be irreversible at the point at which this occurred.  Discussed recommendation for DNI if continued pulmonary progression resulted in respiratory compromise.  Her daughter and husband were present for these discussions today and we will continue to address her overall CODE STATUS.   5) Bony mets: On zometa, CTM, ortho prior with no recommendations, s/p palliative radiation. Having hip pain and xray on 6/21 showed only OA.  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   7) Right hip arthritis: xray with OA. Resolved with prednisone course and now off taper.  8) Imaging: 4/1 abd/pelvis and 3/6 chest. Repeat May to June pending CA 125 and symptoms.   9) Progressive cough: Tessalon Perles added as well as guanfacine with codeine.  Will obtain chest imaging with contrast given concern for progression and last chest imaging almost 6 weeks ago and plan for switch in therapy as above.     I have independently reviewed notes from prior visits, results from labs and imaging. The current plan of care requires intensive monitoring for toxicity due to use of anti-neoplastic treatments. Medical decision making was of high complexity due to ongoing risk of morbidity/mortality from chemotherapy treatments.     Junie Spencer. Chestine Spore, MD  Gynecologic Oncology

## 2023-03-14 NOTE — Unmapped (Signed)
Specialty Medication Follow-up    Robin Weber is a 72 y.o. female with recurrent endometrial cancer who I am seeing for follow up on their treatment with lenvatinib + pembrolizumab.     Chemotherapy: Lenvatinib 14 mg PO daily + Pembrolizumab 200 mg IV q3wk  Start date: Cycle 2 Day 1 (02/16/23)    A/P:   1. Oral Chemotherapy: CBC w/diff and CMP reviewed. Grade 1 serum creatinine elevation and grade 1 hypertension both of which are stable. No grade 3 toxicities but given worsening cough concern for progression therefore current therapy will be discontinued and new chemotherapy started.   Discontinue lenvatinib 14 mg PO daily (started 02/07/23)  Obtain labs prior to next infusion    2. Cough:  Has worsened despite the addition of medication targeted for cough. Given worsening cough and CT scan results will transition to new therapy.   Continue benzonatate 10 mg PO TID PRN cough  Continue famotidine 20 mg PO BID    3. Hypertension: Grade 1 which is stable and within goal. Given that patient is discontinuing lenvatinib no need for further blood pressure monitoring at this time.   Discontinue amlodipine 2.5 mg PO daily  Continue to monitor home BP    4. Doxil education: Brief education regarding adverse effects was reviewed with the patient including: PPE, mucositis, nausea, myelosuppression, and cardiac dysfunction.       I spent approximately 15 minutes in direct patient care.    Next follow up: In 1 week prior to infusion    Referring physician: Dr. Valeda Malm, PharmD, BCOP, CPP  Gynecologic Oncology Clinic Pharmacist  Pager: [phone]    S/O: Robin Weber presents to clinic for follow up with Dr. Chestine Spore. Her blood pressure upon presentation was 127/72 mmHg. She reports that her cough has continued to worsen despite trying benzonatate. She continues to report that the sensation of food getting stuck in her throat has resolved but the dry cough remains present.     Medications reviewed and updated in EPIC? no    Missed doses: ----    Labs  Lab on 03/09/2023   Component Date Value Ref Range Status    CA 125 03/09/2023 37 (H)  0 - 35 U/mL Final    Sodium 03/09/2023 145  135 - 145 mmol/L Final    Potassium 03/09/2023 3.3 (L)  3.5 - 5.1 mmol/L Final    Chloride 03/09/2023 109 (H)  98 - 107 mmol/L Final    CO2 03/09/2023 26.0  20.0 - 31.0 mmol/L Final    Anion Gap 03/09/2023 10  5 - 14 mmol/L Final    BUN 03/09/2023 10  9 - 23 mg/dL Final    Creatinine 45/40/9811 1.15 (H)  0.55 - 1.02 mg/dL Final    BUN/Creatinine Ratio 03/09/2023 9   Final    eGFR CKD-EPI (2021) Female 03/09/2023 52 (L)  >=60 mL/min/1.21m2 Final    eGFR calculated with CKD-EPI 2021 equation in accordance with [employer] and [employer] of Nephrology Task Force recommendations.    Glucose 03/09/2023 105  70 - 179 mg/dL Final    Calcium 91/47/8295 9.9  8.7 - 10.4 mg/dL Final    Albumin 62/13/0865 3.9  3.4 - 5.0 g/dL Final    Total Protein 03/09/2023 6.9  5.7 - 8.2 g/dL Final    Total Bilirubin 03/09/2023 0.9  0.3 - 1.2 mg/dL Final    AST 78/46/9629 24  <=34 U/L Final    ALT 03/09/2023  17  10 - 49 U/L Final    Alkaline Phosphatase 03/09/2023 71  46 - 116 U/L Final    Phosphorus 03/09/2023 2.9  2.4 - 5.1 mg/dL Final    WBC 16/08/9603 4.7  3.6 - 11.2 10*9/L Final    RBC 03/09/2023 3.94 (L)  3.95 - 5.13 10*12/L Final    HGB 03/09/2023 12.5  11.3 - 14.9 g/dL Final    HCT 54/07/8118 36.8  34.0 - 44.0 % Final    MCV 03/09/2023 93.5  77.6 - 95.7 fL Final    MCH 03/09/2023 31.7  25.9 - 32.4 pg Final    MCHC 03/09/2023 33.9  32.0 - 36.0 g/dL Final    RDW 14/78/2956 17.5 (H)  12.2 - 15.2 % Final    MPV 03/09/2023 7.4  6.8 - 10.7 fL Final    Platelet 03/09/2023 230  150 - 450 10*9/L Final    Neutrophils % 03/09/2023 65.1  % Final    Lymphocytes % 03/09/2023 19.3  % Final    Monocytes % 03/09/2023 9.1  % Final    Eosinophils % 03/09/2023 5.8  % Final    Basophils % 03/09/2023 0.7  % Final    Absolute Neutrophils 03/09/2023 3.0  1.8 - 7.8 10*9/L Final    Absolute Lymphocytes 03/09/2023 0.9 (L)  1.1 - 3.6 10*9/L Final    Absolute Monocytes 03/09/2023 0.4  0.3 - 0.8 10*9/L Final    Absolute Eosinophils 03/09/2023 0.3  0.0 - 0.5 10*9/L Final    Absolute Basophils 03/09/2023 0.0  0.0 - 0.1 10*9/L Final    Anisocytosis 03/09/2023 Slight (A)  Not Present Final           Gynecologic Oncology    Gynecologic Oncology Metrics:      Dose and Schedule: Lenvatinib 14 mg PO daily + Pembrolizumab 200 mg IV q3wk     Chemotherapy Dose: Dose Documented     Chemotherapy Schedule: Schedule documented     NCI CTCAE: Serum creatinine - Grade 1, Hypertension - Grade 1     Oral chemotherapy Discontinuation: Progression  Comments: DC 03/09/23

## 2023-03-15 ENCOUNTER — Ambulatory Visit: Admit: 2023-03-15 | Discharge: 2023-03-16 | Payer: MEDICARE

## 2023-03-16 ENCOUNTER — Ambulatory Visit: Admit: 2023-03-16 | Discharge: 2023-03-17 | Payer: MEDICARE

## 2023-03-16 ENCOUNTER — Other Ambulatory Visit: Admit: 2023-03-16 | Discharge: 2023-03-17 | Payer: MEDICARE

## 2023-03-16 DIAGNOSIS — D8989 Other specified disorders involving the immune mechanism, not elsewhere classified: Principal | ICD-10-CM

## 2023-03-16 DIAGNOSIS — R899 Unspecified abnormal finding in specimens from other organs, systems and tissues: Principal | ICD-10-CM

## 2023-03-16 DIAGNOSIS — C801 Malignant (primary) neoplasm, unspecified: Principal | ICD-10-CM

## 2023-03-16 DIAGNOSIS — C549 Malignant neoplasm of corpus uteri, unspecified: Principal | ICD-10-CM

## 2023-03-16 DIAGNOSIS — R799 Abnormal finding of blood chemistry, unspecified: Principal | ICD-10-CM

## 2023-03-16 LAB — CBC W/ AUTO DIFF
BASOPHILS ABSOLUTE COUNT: 0 10*9/L (ref 0.0–0.1)
BASOPHILS RELATIVE PERCENT: 0.3 %
EOSINOPHILS ABSOLUTE COUNT: 0.4 10*9/L (ref 0.0–0.5)
EOSINOPHILS RELATIVE PERCENT: 7.4 %
HEMATOCRIT: 33.4 % — ABNORMAL LOW (ref 34.0–44.0)
HEMOGLOBIN: 11.3 g/dL (ref 11.3–14.9)
LYMPHOCYTES ABSOLUTE COUNT: 1 10*9/L — ABNORMAL LOW (ref 1.1–3.6)
LYMPHOCYTES RELATIVE PERCENT: 20.8 %
MEAN CORPUSCULAR HEMOGLOBIN CONC: 33.9 g/dL (ref 32.0–36.0)
MEAN CORPUSCULAR HEMOGLOBIN: 31.9 pg (ref 25.9–32.4)
MEAN CORPUSCULAR VOLUME: 93.9 fL (ref 77.6–95.7)
MEAN PLATELET VOLUME: 7.6 fL (ref 6.8–10.7)
MONOCYTES ABSOLUTE COUNT: 0.4 10*9/L (ref 0.3–0.8)
MONOCYTES RELATIVE PERCENT: 9.1 %
NEUTROPHILS ABSOLUTE COUNT: 3 10*9/L (ref 1.8–7.8)
NEUTROPHILS RELATIVE PERCENT: 62.4 %
PLATELET COUNT: 203 10*9/L (ref 150–450)
RED BLOOD CELL COUNT: 3.55 10*12/L — ABNORMAL LOW (ref 3.95–5.13)
RED CELL DISTRIBUTION WIDTH: 18.4 % — ABNORMAL HIGH (ref 12.2–15.2)
WBC ADJUSTED: 4.9 10*9/L (ref 3.6–11.2)

## 2023-03-16 LAB — COMPREHENSIVE METABOLIC PANEL
ALBUMIN: 3.4 g/dL (ref 3.4–5.0)
ALKALINE PHOSPHATASE: 62 U/L (ref 46–116)
ALT (SGPT): 12 U/L (ref 10–49)
ANION GAP: 8 mmol/L (ref 5–14)
AST (SGOT): 20 U/L (ref <=34)
BILIRUBIN TOTAL: 0.7 mg/dL (ref 0.3–1.2)
BLOOD UREA NITROGEN: 9 mg/dL (ref 9–23)
BUN / CREAT RATIO: 9
CALCIUM: 9.2 mg/dL (ref 8.7–10.4)
CHLORIDE: 114 mmol/L — ABNORMAL HIGH (ref 98–107)
CO2: 27 mmol/L (ref 20.0–31.0)
CREATININE: 1.01 mg/dL
EGFR CKD-EPI (2021) FEMALE: 60 mL/min/{1.73_m2} (ref >=60)
GLUCOSE RANDOM: 112 mg/dL (ref 70–179)
POTASSIUM: 3.2 mmol/L — ABNORMAL LOW (ref 3.5–5.1)
PROTEIN TOTAL: 6.5 g/dL (ref 5.7–8.2)
SODIUM: 149 mmol/L — ABNORMAL HIGH (ref 135–145)

## 2023-03-16 LAB — MAGNESIUM: MAGNESIUM: 1.6 mg/dL (ref 1.6–2.6)

## 2023-03-16 LAB — TSH: THYROID STIMULATING HORMONE: 2.086 u[IU]/mL (ref 0.550–4.780)

## 2023-03-16 LAB — T4, FREE: FREE T4: 0.99 ng/dL (ref 0.89–1.76)

## 2023-03-16 LAB — CA 125: CA 125: 47 U/mL — ABNORMAL HIGH (ref 0–35)

## 2023-03-16 MED ORDER — DULOXETINE 20 MG CAPSULE,DELAYED RELEASE
ORAL_CAPSULE | Freq: Every day | ORAL | 3 refills | 30.00000 days | Status: CP
Start: 2023-03-16 — End: 2023-07-14

## 2023-03-16 MED ORDER — HYDROCODONE-HOMATROPINE 5 MG-1.5 MG/5 ML ORAL SYRUP
ORAL | 0 refills | 16.00000 days | Status: CP | PRN
Start: 2023-03-16 — End: ?

## 2023-03-16 MED ORDER — PROCHLORPERAZINE MALEATE 10 MG TABLET
ORAL_TABLET | Freq: Four times a day (QID) | ORAL | 2 refills | 8.00000 days | Status: CP | PRN
Start: 2023-03-16 — End: ?

## 2023-03-16 MED ORDER — ONDANSETRON HCL 8 MG TABLET
ORAL_TABLET | Freq: Three times a day (TID) | ORAL | 2 refills | 10.00000 days | Status: CP | PRN
Start: 2023-03-16 — End: ?

## 2023-03-16 MED ADMIN — diphenhydrAMINE (BENADRYL) capsule/tablet 25 mg: 25 mg | ORAL | @ 20:00:00 | Stop: 2023-03-16

## 2023-03-16 MED ADMIN — potassium chloride ER tablet 40 mEq: 40 meq | ORAL | @ 20:00:00

## 2023-03-16 MED ADMIN — famotidine (PEPCID) tablet 40 mg: 40 mg | ORAL | @ 20:00:00 | Stop: 2023-03-16

## 2023-03-16 MED ADMIN — dexAMETHasone (DECADRON) tablet 20 mg: 20 mg | ORAL | @ 20:00:00 | Stop: 2023-03-16

## 2023-03-16 MED ADMIN — dextrose 5 % infusion: 100 mL/h | INTRAVENOUS | @ 20:00:00

## 2023-03-16 MED ADMIN — heparin, porcine (PF) 100 unit/mL injection 500 Units: 500 [IU] | INTRAVENOUS | @ 22:00:00 | Stop: 2023-03-17

## 2023-03-16 MED ADMIN — DOXOrubicin liposomal (DOXIL) 66.4 mg in dextrose 5 % 250 mL IVPB: 40 mg/m2 | INTRAVENOUS | @ 21:00:00 | Stop: 2023-03-16

## 2023-03-16 NOTE — Unmapped (Unsigned)
OUTPATIENT ONCOLOGY PALLIATIVE CARE CONSULT NOTE        Principal Diagnosis: Robin Weber is a 72 y.o. 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  Disease sites include lung, bone, pelvis and lymph nodes.    Assessment/Plan:     #Chemo-induced neuropathy-mainly affecting bilateral toes-slightly worsening at bedtime.  Last chemotherapy was 4 months out.  Looking to see if neuropathy discomfort improves in the next 2 months.  -Increase gabapentin 300 mg twice a day TO gabapentin 300 mg a day and 600 mg at nighttime.  -Continue Cymbalta 20 mg daily. (Note-higher doses produce dizziness)  -Retry capsaicin-Salon Pas otc  -Can consider consult to spine team for Qutenza-capsaicin 8% topical application in future.  -Daughter inquiring about massage and encouraged this intervention.  Provided [place] massage therapy support.      #Goals of care  -Wants to be more active.     -At prior visits:  -At initial visit primary goal is to regain her independence.      Participants: Mr. & Mrs. Mendell and daughter Robin Weber and NP  Discussion/Action:   -Provided the prepare for your care healthcare power of attorney.  Primary healthcare decision-maker is her daughter Tambra Carkhuff followed by her son Maat Bauguess and then her spouse Mr. Neile Nuding.    Health Care Decision Maker     HCDM (patient stated preference): Robin, Weber - Daughter - [phone]    HCDM (patient stated preference): Weber,Robin SR - Spouse - [phone]    HCDM (patient stated preference): [name],Robin Weber - [phone]      #Controlled substances risk management.   - Patient does not have a signed pain medication agreement with our team.   - [place]CCS[place] database was reviewed today and it was appropriate.   - Urine drug screen was not performed at this visit. Findings: not applicable.   - Patient has received information about safe storage and administration of medications.   - Patient has not received a prescription for narcan; is not applicable.       Current cancer-directed therapy: doxil    F/u:      ----------------------------------------    Oncology Team: Dr. Chestine Spore, Marko Stai, CPP  PCP: Roger Kill, MD      HPI:  72 y.o. woman with Stage IVB GYN cancer (presumed uterine origin based on histology but primary site unclear).  Hospitalized from 11/20/2021 to 12/11/2021 for cancer related pain due to Left Groin Pain 2/2 Osseous Lytic Lesion of Pubic Bone.  Received radiation therapy and intensive rehab.  Reports that her rehab has really progressed significantly and now even able to walk without the rollator for a couple steps.  Fortunately, no more pain in the pelvis and left hip.  Primary symptom now is that neuropathic pain predominantly in bilateral toes.  Describes it as annoying and occurring 50% of the time.  Walking helps decrease the pain.  Hand neuropathy is not so much.  Has not taken any oxycodone in the last 2 months.      Symptom Review: From initial assessment    Fatigue: Describes as okay.  Mobility: Using the rollator even in the home.  When comes to [facility] uses a wheelchair.  Remains active with home health PT and OT  Sleep: Could be better.  Feels that because she is not moving as much as she normally does that she is just not as tired when she goes to bed.  Appetite: Now it  is better.  Nausea: Denies  Bowel function: Chemotherapy produces an altered bowel regimen fluctuating from constipation to diarrhea.  Lately its been daily with no issues.  Mood: Denies anxiety.  At times it is difficult to not have the lifestyle that she did have prior to the diagnosis.    -Had 09/10/22:Robotic-assisted total laparoscopic hysterectomy, left salpingo-oophorectomy, appendectomy       Interval hx 03/16/23 CK, Britta Mccreedy and [place]      Symptom Review:  General:   Pain:   Fatigue:   Mobility:   Sleep:   Appetite:   Nausea:   Bowel function:  Dyspnea:   Secretions:   Mood:              Palliative Performance Scale: 80% - Ambulation: Full / Normal Activity with effort, some evidence of disease / Self-Care:Full / Intake: Normal or reduced / Level of Conscious: Full      Social History:   Ms. Contois lives with her husband Robin Weber and her daughterJonna Weber lives close by and her son Robin Weber lives in [place].  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  Enjoys spending time with her family.  Likes football.    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 Following cycle#1 of carbo/taxol on 11/18/21 she was admitted on 11/20/2021 for dysuria and left groin pain with the etiology due to a destructive osseous lytic lesion in her left inferior pubic ramus. Had palliative radiation to the bony lesion completed on 1/10. Received cycle#2 on 12/09/21 prior to discharge to SNF. Her course was complicated by urinary retention due to tumor burden with failed voiding trials.      Adenocarcinoma (CMS-HCC)   11/04/2021 Initial Diagnosis    Adenocarcinoma (CMS-HCC)     11/18/2021 - 06/09/2022 Chemotherapy    OP GYN PACLITAXEL/CARBOPLATIN  PACLitaxel 175 mg/m2 IV on day 1, CARBOplatin AUC6 IV on day 1, every 21 days     07/02/2022 -  Chemotherapy    OP PEMBROLIZUMAB 200 MG Q3W  Pembrolizumab 200 mg IV on Day 1  21-day cycle     Malignant neoplasm of corpus uteri, unspecified (CMS-HCC) Adenocarcinoma (CMS-HCC)     11/18/2021 - 06/09/2022 Chemotherapy    OP GYN PACLITAXEL/CARBOPLATIN  PACLitaxel 175 mg/m2 IV on day 1, CARBOplatin AUC6 IV on day 1, every 21 days     07/02/2022 -  Chemotherapy    OP PEMBROLIZUMAB 200 MG Q3W  Pembrolizumab 200 mg  IV on Day 1  21-day cycle     Malignant neoplasm of corpus uteri, unspecified (CMS-HCC)   06/29/2022 Initial Diagnosis    Malignant neoplasm of corpus uteri, unspecified (CMS-HCC)     07/02/2022 -  Chemotherapy    OP PEMBROLIZUMAB 200 MG Q3W  Pembrolizumab 200 mg IV on Day 1  21-day cycle     09/10/2022 Surgery    Procedure on 09/10/22:  Robotic-assisted total laparoscopic hysterectomy, left salpingo-oophorectomy, appendectomy      Operative Findings:  Bimanual exam without palpable adnexal masses, small mobile uterus. Normal appearing cervix without lesions. Upper abdominal survey normal. Mild intraperitoneal adhesive disease, including a thick adhesion of the transverse colon to the anterior abdominal wall and adhesion of the appendix to the right uterine cornua necessitating appendectomy. No evidence of residual intraperitoneal disease.      09/22/2022 -  Cancer Staged    Cancer Staging   No matching staging information was found for the patient.       09/22/2022 Tumor Board    FIGO Stage: IVB endometrial endometrioid adenocarcinoma, FIGO grade 2, extensive +LVSI, MMRi, p53 WT; s/p NACT followed by 3 cycles single-agent pembrolizumab and interval palliative robotic hysterectomy.  Plan:   - Continue maintenance pembrolizumab.  - Obtain ER/PR staining.     Sources:   Eskander et al. (2023) Pembrolizumab plus chemotherapy in advanced endometrial cancer (DU202). NEJM.      09/22/2022 -  Cancer Staged    Staging form: Corpus Uteri - Carcinoma and Carcinosarcoma, AJCC 8th Edition  - Clinical stage from 09/22/2022: FIGO Stage IVB - Signed by Bary Leriche, MD on 09/23/2022       12/07/2022 -  Other    Foundation1:  AKT1 (Pathogenic)  CTNNB1 (Pathogenic) is unknown.      REVIEW OF SYSTEMS:  A comprehensive review of 10 systems was negative except for pertinent positives noted in HPI.    PHYSICAL EXAM:   Vital signs for this encounter: VS reviewed in EPIC.  GEN: Awake and alert, pleasant appearing female in no acute distress  HEENT: No scleral icterus. No facial asymmetry.   LUNGS: No increased work of breathing.   SKIN: No rashes, petechiae or jaundice noted on examined skin  MSK: No sarcopenia,   EXT: No edema noted of the lower extremities  NEURO: No focal deficits appreciated. No myoclonus.  PSYCH: Alert and oriented to person, place and time. Euthymic.           I personally spent 40 minutes face-to-face and non-face-to-face in the care of this patient, which includes all pre, intra, and post visit time on the date of service.  All documented time was specific to the E/M visit and does not include any procedures that may have been performed.    Pam Drown, FNP  [facility]patient Oncology Palliative [facility]

## 2023-03-16 NOTE — Unmapped (Unsigned)
Patient was screened high risk for falls, the following steps were taken to reduce the risk of falls:    - Patient using wheelchair, wheels were locked prior to standing/transferring patient- Patient using assistive device, which remained within patients reach  - Physical environment was clear of slip/trip hazards  - Footwear observed to ensure it was appropriate, if not, patient advised on importance of well fitted, closed toe, sturdy shoes with non-skid soles  - Preventing falls: Care instructions given to patient

## 2023-03-16 NOTE — Unmapped (Signed)
Pharmacy: First Cycle Chemotherapy Patient Education    Chemotherapy regimen/agents: liposomal doxorubicin  Venous Access: port  Caregivers present for education: husband    Robin Weber is a 72 y.o. with endometrial cancer. Chemotherapy education was provided to the patient by the oncology pharmacy team.    Side effects discussed included but were not limited to:   infusion-related reactions, complications associated with myelosuppresion (such as infection/fever, fatigue, and bleeding), nausea/vomiting, diarrhea, constipation, hand-foot syndrome, mucositis, taste changes, skin/nail changes, changes in color of bodily fluids, or fatigue.      The [facility] patient handout or the Hematology/Oncology Fellow on-call phone number for concerns after hours were given.The patient verbalized understanding of this information.  Medication reconciliation was completed and the medication list was updated in EPIC.      Approximate time spent with patient: 15  Minutes.    Kennon Holter, PharmD, CPP

## 2023-03-16 NOTE — Unmapped (Unsigned)
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    11/18/21-03/12/22: Paclitaxel/Carboplatin x6 cycles  11/2021: 2000cGy palliative XRT to left pubic ramus lesion  04/16/22-06/09/22: Paclitaxel/Carboplatin/Pembrolizumab x 3 cycles  07/02/22--09/01/22: maintenance pembrolizumab  09/10/2022: Palliative robotic-assisted total laparoscopic hysterectomy, LSO, appendectomy, R2- FIGO grade 2 endometrioid adenocarcinoma with focal squamous differentiation, full thickness myometrial invasion. LVI+, margins negative; p53 WT; MMR intact; pT3aNxM1  09/22/22-11/24/22: pembrolizumab  11/26/22: Disease progression- increasing left common iliac adenopathy; increase in size of innumerable metastatic pulmonary nodules  01/05/23-present: Lenvatinib/pembrolizumab CA125- 76    Genetics/actionable:   FoundationOne: MSS, TMB low  AKT1 E17K-- possible mTOR  CTNNB1 T41N-- possible mTOR  HER2/ERBB2 FISH negative; IHC 2+    Assessment:  Robin Weber is a 72 yo female with progressive stage IVb endometrial cancer. She was started on lenvatinib/pembrolizumab after disease progression on maintenance pembrolizumab.        Issues addressed today:   Blood pressure- increase in systolic pressure with start of lenvatinib. Was previously on amolidipine 2.5 mg but this was discontinued due to concern for episodes of hypotension.  Will restart amlodipine at 2.5 mg daily and she will continue to monitor her BP at home.  Thyroid dysfunction- TSH 8.01, fT4 0.77; unclear if this is 2/2 lenvatinib or pembroluzimab- start levothyroxine 25 mcg daily  UTI- rx Keflex 500 mg BID x 5 days  Elevated creatinine- UA+ protein 30 mg/dL; creatinine 1.61 - CTM  Low serum potassium- 3.3 today- repletion in infusion center. If continues to be low may consider home prescription.     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 query worsened with pembro- no issues today- was treated with low-dose prednisone- now off steroids and resolved.  Cough/SOB- likely secondary to disease burden, updated CT chest 01/26/23- only slight enlargement of pulmonary nodules; no evidence of pneumonitis; benzonatate  prn- improved  Elevated creatinine   Elevated TSH; normal fT4- start levothyroxine 25 mcg daily      INTERVAL HISTORY:    Energy level and appetite improved with dose-reduced lenvatinib.  Denies new or worsening pain.   Cough is mildly improved.   Denies fevers, chills, sweats, chest pain or worsening shortness of breath.   Denies issues with bowel or bladder habits.   Denies dysuria or hematuria.      PAST MEDICAL/SURGICAL HX:  Past Medical History:   Diagnosis Date    GERD (gastroesophageal reflux disease)     HTN (hypertension)     Osteoarthritis     Pelvic mass      CURRENT MEDICATIONS:    Physical Exam  Constitutional:       General: She is not in acute distress.     Appearance: Normal appearance. She is  not ill-appearing.   Cardiovascular:      Heart sounds: Normal heart sounds.   Pulmonary:      Effort: Pulmonary effort is normal.      Breath sounds: Normal breath sounds.   Musculoskeletal:         General: No swelling.   Skin:     General: Skin is warm and dry.   Neurological:      General: No focal deficit present.      Mental Status: She is alert and oriented to person, place, and time.   Psychiatric:         Mood and Affect: Mood normal.         Behavior: Behavior normal.     Social History:    She is married. She is retired from Agricultural consultant. She has two children, daughterJonna Coup lives nearby; son [name] in [place].      Lab on 03/09/2023   Component Date Value Ref Range Status    CA 125 03/09/2023 37 (H)  0 - 35 U/mL Final    Sodium 03/09/2023 145  135 - 145 mmol/L Final    Potassium 03/09/2023 3.3 (L)  3.5 - 5.1 mmol/L Final    Chloride 03/09/2023 109 (H)  98 - 107 mmol/L Final    CO2 03/09/2023 26.0  20.0 - 31.0 mmol/L Final    Anion Gap 03/09/2023 10  5 - 14 mmol/L Final    BUN 03/09/2023 10  9 - 23 mg/dL Final    Creatinine 16/08/9603 1.15 (H)  0.55 - 1.02 mg/dL Final    BUN/Creatinine Ratio 03/09/2023 9   Final    eGFR CKD-EPI (2021) Female 03/09/2023 52 (L)  >=60 mL/min/1.70m2 Final    eGFR calculated with CKD-EPI 2021 equation in accordance with [employer] and [employer] of Nephrology Task Force recommendations.    Glucose 03/09/2023 105  70 - 179 mg/dL Final    Calcium 54/07/8118 9.9  8.7 - 10.4 mg/dL Final    Albumin 14/78/2956 3.9  3.4 - 5.0 g/dL Final    Total Protein 03/09/2023 6.9  5.7 - 8.2 g/dL Final    Total Bilirubin 03/09/2023 0.9  0.3 - 1.2 mg/dL Final    AST 21/30/8657 24  <=34 U/L Final    ALT 03/09/2023 17  10 - 49 U/L Final    Alkaline Phosphatase 03/09/2023 71  46 - 116 U/L Final    Phosphorus 03/09/2023 2.9  2.4 - 5.1 mg/dL Final    WBC 84/69/6295 4.7  3.6 - 11.2 10*9/L Final    RBC 03/09/2023 3.94 (L)  3.95 - 5.13 10*12/L Final    HGB 03/09/2023 12.5  11.3 - 14.9 g/dL Final    HCT 28/41/3244 36.8  34.0 - 44.0 % Final    MCV 03/09/2023 93.5  77.6 - 95.7 fL Final    MCH 03/09/2023 31.7  25.9 - 32.4 pg Final    MCHC 03/09/2023 33.9  32.0 - 36.0 g/dL Final    RDW 11/24/7251 17.5 (H)  12.2 - 15.2 % Final    MPV 03/09/2023 7.4  6.8 - 10.7 fL Final Platelet 03/09/2023 230  150 - 450 10*9/L Final    Neutrophils % 03/09/2023 65.1  % Final    Lymphocytes % 03/09/2023 19.3  % Final    Monocytes % 03/09/2023 9.1  % Final    Eosinophils % 03/09/2023 5.8  % Final    Basophils % 03/09/2023 0.7  % Final    Absolute Neutrophils  03/09/2023 3.0  1.8 - 7.8 10*9/L Final    Absolute Lymphocytes 03/09/2023 0.9 (L)  1.1 - 3.6 10*9/L Final    Absolute Monocytes 03/09/2023 0.4  0.3 - 0.8 10*9/L Final    Absolute Eosinophils 03/09/2023 0.3  0.0 - 0.5 10*9/L Final    Absolute Basophils 03/09/2023 0.0  0.0 - 0.1 10*9/L Final    Anisocytosis 03/09/2023 Slight (A)  Not Present Final

## 2023-03-17 MED ORDER — POTASSIUM CHLORIDE 20 MEQ ORAL PACKET
PACK | Freq: Every day | ORAL | 3 refills | 30 days | Status: CP
Start: 2023-03-17 — End: ?

## 2023-03-17 NOTE — Unmapped (Signed)
Lab on 03/16/2023   Component Date Value Ref Range Status    CA 125 03/16/2023 47 (H)  0 - 35 U/mL Final    Magnesium 03/16/2023 1.6  1.6 - 2.6 mg/dL Final    Sodium 16/08/9603 149 (H)  135 - 145 mmol/L Final    Potassium 03/16/2023 3.2 (L)  3.5 - 5.1 mmol/L Final    Chloride 03/16/2023 114 (H)  98 - 107 mmol/L Final    CO2 03/16/2023 27.0  20.0 - 31.0 mmol/L Final    Anion Gap 03/16/2023 8  5 - 14 mmol/L Final    BUN 03/16/2023 9  9 - 23 mg/dL Final    Creatinine 54/07/8118 1.01  0.55 - 1.02 mg/dL Final    BUN/Creatinine Ratio 03/16/2023 9   Final    eGFR CKD-EPI (2021) Female 03/16/2023 60  >=60 mL/min/1.8m2 Final    eGFR calculated with CKD-EPI 2021 equation in accordance with [employer] and [employer] of Nephrology Task Force recommendations.    Glucose 03/16/2023 112  70 - 179 mg/dL Final    Calcium 14/78/2956 9.2  8.7 - 10.4 mg/dL Final    Albumin 21/30/8657 3.4  3.4 - 5.0 g/dL Final    Total Protein 03/16/2023 6.5  5.7 - 8.2 g/dL Final    Total Bilirubin 03/16/2023 0.7  0.3 - 1.2 mg/dL Final    AST 84/69/6295 20  <=34 U/L Final    ALT 03/16/2023 12  10 - 49 U/L Final    Alkaline Phosphatase 03/16/2023 62  46 - 116 U/L Final    TSH 03/16/2023 2.086  0.550 - 4.780 uIU/mL Final    Free T4 03/16/2023 0.99  0.89 - 1.76 ng/dL Final    WBC 28/41/3244 4.9  3.6 - 11.2 10*9/L Final    RBC 03/16/2023 3.55 (L)  3.95 - 5.13 10*12/L Final    HGB 03/16/2023 11.3  11.3 - 14.9 g/dL Final    HCT 11/24/7251 33.4 (L)  34.0 - 44.0 % Final    MCV 03/16/2023 93.9  77.6 - 95.7 fL Final    MCH 03/16/2023 31.9  25.9 - 32.4 pg Final    MCHC 03/16/2023 33.9  32.0 - 36.0 g/dL Final    RDW 66/44/0347 18.4 (H)  12.2 - 15.2 % Final    MPV 03/16/2023 7.6  6.8 - 10.7 fL Final    Platelet 03/16/2023 203  150 - 450 10*9/L Final    Neutrophils % 03/16/2023 62.4  % Final    Lymphocytes % 03/16/2023 20.8  % Final    Monocytes % 03/16/2023 9.1  % Final    Eosinophils % 03/16/2023 7.4  % Final    Basophils % 03/16/2023 0.3  % Final    Absolute Neutrophils 03/16/2023 3.0  1.8 - 7.8 10*9/L Final    Absolute Lymphocytes 03/16/2023 1.0 (L)  1.1 - 3.6 10*9/L Final    Absolute Monocytes 03/16/2023 0.4  0.3 - 0.8 10*9/L Final    Absolute Eosinophils 03/16/2023 0.4  0.0 - 0.5 10*9/L Final    Absolute Basophils 03/16/2023 0.0  0.0 - 0.1 10*9/L Final    Anisocytosis 03/16/2023 Slight (A)  Not Present Final

## 2023-03-17 NOTE — Unmapped (Signed)
Patient arrived to chair 57 for C1D1 Doxil. Labs within parameter for tx today. Port was flushed, + BR. Premeds given. Pt. completed and tolerated tx well. Port was flushed, +BR. Port was de-accessed and patient discharged with no additional needs, AVS provided.

## 2023-03-18 MED ORDER — LIDOCAINE-PRILOCAINE 2.5 %-2.5 % TOPICAL CREAM
Freq: Every day | TOPICAL | 2 refills | 0 days | Status: CP | PRN
Start: 2023-03-18 — End: 2024-03-17

## 2023-03-22 NOTE — Unmapped (Signed)
Addended by: Crecencio Mc on: 03/22/2023 02:49 PM     Modules accepted: Orders

## 2023-03-22 NOTE — Unmapped (Signed)
Clinical Pharmacist Practitioner: Gynecologic Oncology Clinic    Patient Name: Robin Weber  Patient Age: 72 y.o.  Encounter Date: 03/16/2023  Referring physician: Dr. Chestine Spore    Reason for visit:  Supportive care    Chemotherapy: Doxil  Chemotherapy Cycle: Cycle 1 Day 1 (03/17/23)    Assessment and recommendations:  Doxil education: Reviewed adverse effects such as mucositis, PPE, and cardiac dysfunction with the patient.     Will follow up with patient in 4 weeks on 04/13/23.    I spent 15 minutes in direct patient care.    Hermelinda Medicus, PharmD, BCOP, CPP  Gynecologic Oncology Clinic Pharmacist  Pager: [phone]    History of Present Illness:  Robin Weber Ausburn is a 72 y.o. female who was diagnosed with recurrent endometrial cancer who is currently receiving doxil therapy.    Interim History: Robin Weber presents to clinic prior to infusion. She continues to experience a cough which is not worsening but is also not controlled. PPE precautions such as avoiding heat and friction were reviewed with the patient. Patient was advised to utilize lotion especially on hands and feet.

## 2023-03-28 NOTE — Unmapped (Signed)
Robin Weber has been discontinued due to worsening cough and possible progression of disease. Will forward information to pharmacist.

## 2023-03-29 NOTE — Unmapped (Signed)
Specialty Medication(s): Robin Weber    Robin Weber has been dis-enrolled from the [facility] specialty pharmacy services due to  worsening of cough and possible progression .    Additional information provided to the patient: no    Rollen Sox, [facility]  [facility] Specialty Pharmacist

## 2023-03-31 MED ORDER — GABAPENTIN 300 MG CAPSULE
ORAL_CAPSULE | 2 refills | 0 days
Start: 2023-03-31 — End: ?

## 2023-04-01 MED ORDER — GABAPENTIN 300 MG CAPSULE
ORAL_CAPSULE | 2 refills | 0 days | Status: CP
Start: 2023-04-01 — End: ?

## 2023-04-13 ENCOUNTER — Ambulatory Visit: Admit: 2023-04-13 | Discharge: 2023-04-14 | Payer: MEDICARE

## 2023-04-13 ENCOUNTER — Other Ambulatory Visit: Admit: 2023-04-13 | Discharge: 2023-04-14 | Payer: MEDICARE

## 2023-04-13 DIAGNOSIS — C549 Malignant neoplasm of corpus uteri, unspecified: Principal | ICD-10-CM

## 2023-04-13 DIAGNOSIS — R899 Unspecified abnormal finding in specimens from other organs, systems and tissues: Principal | ICD-10-CM

## 2023-04-13 DIAGNOSIS — C548 Malignant neoplasm of overlapping sites of corpus uteri: Principal | ICD-10-CM

## 2023-04-13 DIAGNOSIS — R799 Abnormal finding of blood chemistry, unspecified: Principal | ICD-10-CM

## 2023-04-13 DIAGNOSIS — D8989 Other specified disorders involving the immune mechanism, not elsewhere classified: Principal | ICD-10-CM

## 2023-04-13 DIAGNOSIS — C801 Malignant (primary) neoplasm, unspecified: Principal | ICD-10-CM

## 2023-05-11 ENCOUNTER — Ambulatory Visit: Admit: 2023-05-11 | Discharge: 2023-05-11 | Payer: MEDICARE

## 2023-05-11 ENCOUNTER — Other Ambulatory Visit: Admit: 2023-05-11 | Discharge: 2023-05-11 | Payer: MEDICARE

## 2023-05-11 DIAGNOSIS — R899 Unspecified abnormal finding in specimens from other organs, systems and tissues: Principal | ICD-10-CM

## 2023-05-11 DIAGNOSIS — C549 Malignant neoplasm of corpus uteri, unspecified: Principal | ICD-10-CM

## 2023-05-11 DIAGNOSIS — D8989 Other specified disorders involving the immune mechanism, not elsewhere classified: Principal | ICD-10-CM

## 2023-05-11 DIAGNOSIS — C801 Malignant (primary) neoplasm, unspecified: Principal | ICD-10-CM

## 2023-05-11 DIAGNOSIS — R799 Abnormal finding of blood chemistry, unspecified: Principal | ICD-10-CM

## 2023-05-12 DIAGNOSIS — C549 Malignant neoplasm of corpus uteri, unspecified: Principal | ICD-10-CM

## 2023-05-12 DIAGNOSIS — C801 Malignant (primary) neoplasm, unspecified: Principal | ICD-10-CM

## 2023-05-12 DIAGNOSIS — R799 Abnormal finding of blood chemistry, unspecified: Principal | ICD-10-CM

## 2023-05-12 DIAGNOSIS — R899 Unspecified abnormal finding in specimens from other organs, systems and tissues: Principal | ICD-10-CM

## 2023-05-12 DIAGNOSIS — D8989 Other specified disorders involving the immune mechanism, not elsewhere classified: Principal | ICD-10-CM

## 2023-05-24 MED ORDER — PANTOPRAZOLE 20 MG TABLET,DELAYED RELEASE
ORAL_TABLET | Freq: Every day | ORAL | 1 refills | 30 days | Status: CP
Start: 2023-05-24 — End: 2024-05-23

## 2023-05-24 MED ORDER — ONDANSETRON 4 MG DISINTEGRATING TABLET
ORAL_TABLET | Freq: Three times a day (TID) | ORAL | 2 refills | 10 days | Status: CP | PRN
Start: 2023-05-24 — End: ?

## 2023-06-03 ENCOUNTER — Ambulatory Visit: Admit: 2023-06-03 | Discharge: 2023-06-03 | Payer: MEDICARE

## 2023-06-08 ENCOUNTER — Ambulatory Visit: Admit: 2023-06-08 | Discharge: 2023-06-08 | Payer: MEDICARE

## 2023-06-08 ENCOUNTER — Other Ambulatory Visit: Admit: 2023-06-08 | Discharge: 2023-06-08 | Payer: MEDICARE

## 2023-06-08 DIAGNOSIS — R899 Unspecified abnormal finding in specimens from other organs, systems and tissues: Principal | ICD-10-CM

## 2023-06-08 DIAGNOSIS — R799 Abnormal finding of blood chemistry, unspecified: Principal | ICD-10-CM

## 2023-06-08 DIAGNOSIS — D8989 Other specified disorders involving the immune mechanism, not elsewhere classified: Principal | ICD-10-CM

## 2023-06-08 DIAGNOSIS — C549 Malignant neoplasm of corpus uteri, unspecified: Principal | ICD-10-CM

## 2023-06-08 DIAGNOSIS — C801 Malignant (primary) neoplasm, unspecified: Principal | ICD-10-CM

## 2023-06-08 DIAGNOSIS — N179 Acute kidney failure, unspecified: Principal | ICD-10-CM

## 2023-06-08 MED ORDER — FAMOTIDINE 20 MG TABLET
ORAL_TABLET | Freq: Two times a day (BID) | ORAL | 1 refills | 30 days | Status: CP
Start: 2023-06-08 — End: 2024-06-07

## 2023-06-15 ENCOUNTER — Other Ambulatory Visit: Admit: 2023-06-15 | Discharge: 2023-06-16 | Payer: MEDICARE

## 2023-06-15 ENCOUNTER — Ambulatory Visit: Admit: 2023-06-15 | Discharge: 2023-06-16 | Payer: MEDICARE

## 2023-06-15 DIAGNOSIS — C549 Malignant neoplasm of corpus uteri, unspecified: Principal | ICD-10-CM

## 2023-06-15 DIAGNOSIS — R799 Abnormal finding of blood chemistry, unspecified: Principal | ICD-10-CM

## 2023-06-15 DIAGNOSIS — I509 Heart failure, unspecified: Principal | ICD-10-CM

## 2023-06-15 DIAGNOSIS — D8989 Other specified disorders involving the immune mechanism, not elsewhere classified: Principal | ICD-10-CM

## 2023-06-15 DIAGNOSIS — C801 Malignant (primary) neoplasm, unspecified: Principal | ICD-10-CM

## 2023-06-15 DIAGNOSIS — R899 Unspecified abnormal finding in specimens from other organs, systems and tissues: Principal | ICD-10-CM

## 2023-06-15 DIAGNOSIS — Z9189 Other specified personal risk factors, not elsewhere classified: Principal | ICD-10-CM

## 2023-06-16 MED ORDER — DEXAMETHASONE 4 MG TABLET
ORAL_TABLET | Freq: Every day | ORAL | 2 refills | 6 days | Status: CP
Start: 2023-06-16 — End: 2024-06-15

## 2023-06-22 MED ORDER — OLANZAPINE 2.5 MG TABLET
ORAL_TABLET | Freq: Every evening | ORAL | 2 refills | 30 days | Status: CP
Start: 2023-06-22 — End: 2023-06-22

## 2023-06-23 MED ORDER — OLANZAPINE 2.5 MG TABLET
ORAL_TABLET | Freq: Every evening | ORAL | 2 refills | 30 days | Status: CP
Start: 2023-06-23 — End: 2024-06-22
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
## 2023-07-06 ENCOUNTER — Ambulatory Visit: Admit: 2023-07-06 | Discharge: 2023-07-07 | Payer: MEDICARE

## 2023-07-06 ENCOUNTER — Other Ambulatory Visit: Admit: 2023-07-06 | Discharge: 2023-07-07 | Payer: MEDICARE

## 2023-07-06 DIAGNOSIS — C549 Malignant neoplasm of corpus uteri, unspecified: Principal | ICD-10-CM

## 2023-07-06 DIAGNOSIS — D8989 Other specified disorders involving the immune mechanism, not elsewhere classified: Principal | ICD-10-CM

## 2023-07-06 DIAGNOSIS — C801 Malignant (primary) neoplasm, unspecified: Principal | ICD-10-CM

## 2023-07-06 DIAGNOSIS — Z5111 Encounter for antineoplastic chemotherapy: Principal | ICD-10-CM

## 2023-07-06 DIAGNOSIS — R899 Unspecified abnormal finding in specimens from other organs, systems and tissues: Principal | ICD-10-CM

## 2023-07-06 DIAGNOSIS — R799 Abnormal finding of blood chemistry, unspecified: Principal | ICD-10-CM

## 2023-07-06 MED ORDER — AMOXICILLIN 875 MG-POTASSIUM CLAVULANATE 125 MG TABLET
ORAL_TABLET | Freq: Two times a day (BID) | ORAL | 0 refills | 7 days | Status: CP
Start: 2023-07-06 — End: 2023-07-13

## 2023-07-06 MED ORDER — PANTOPRAZOLE 40 MG TABLET,DELAYED RELEASE
ORAL_TABLET | Freq: Every day | ORAL | 1 refills | 30 days | Status: CP
Start: 2023-07-06 — End: 2024-07-05

## 2023-07-06 MED ORDER — MEGESTROL 625 MG/5 ML (125 MG/ML) ORAL SUSPENSION
Freq: Every day | ORAL | 0 refills | 30 days | Status: CP
Start: 2023-07-06 — End: 2024-07-05

## 2023-07-06 MED ORDER — PREDNISONE 5 MG TABLET
ORAL_TABLET | Freq: Every day | ORAL | 0 refills | 7 days | Status: CP
Start: 2023-07-06 — End: 2023-07-13

## 2023-07-06 MED ORDER — ONDANSETRON 4 MG DISINTEGRATING TABLET
ORAL_TABLET | Freq: Three times a day (TID) | ORAL | 0 refills | 10 days | Status: CP | PRN
Start: 2023-07-06 — End: ?

## 2023-07-07 DIAGNOSIS — J069 Acute upper respiratory infection, unspecified: Principal | ICD-10-CM

## 2023-07-07 MED ORDER — CEFDINIR 250 MG/5 ML ORAL SUSPENSION
Freq: Two times a day (BID) | ORAL | 0 refills | 7 days | Status: CP
Start: 2023-07-07 — End: 2023-07-14

## 2023-07-13 ENCOUNTER — Other Ambulatory Visit: Admit: 2023-07-13 | Discharge: 2023-07-14 | Payer: MEDICARE

## 2023-07-13 ENCOUNTER — Ambulatory Visit: Admit: 2023-07-13 | Discharge: 2023-07-14 | Payer: MEDICARE

## 2023-07-13 DIAGNOSIS — R0981 Nasal congestion: Principal | ICD-10-CM

## 2023-07-13 DIAGNOSIS — R899 Unspecified abnormal finding in specimens from other organs, systems and tissues: Principal | ICD-10-CM

## 2023-07-13 DIAGNOSIS — R0982 Postnasal drip: Principal | ICD-10-CM

## 2023-07-13 DIAGNOSIS — C7951 Secondary malignant neoplasm of bone: Principal | ICD-10-CM

## 2023-07-13 DIAGNOSIS — R799 Abnormal finding of blood chemistry, unspecified: Principal | ICD-10-CM

## 2023-07-13 DIAGNOSIS — H938X9 Other specified disorders of ear, unspecified ear: Principal | ICD-10-CM

## 2023-07-13 DIAGNOSIS — C549 Malignant neoplasm of corpus uteri, unspecified: Principal | ICD-10-CM

## 2023-07-13 DIAGNOSIS — C801 Malignant (primary) neoplasm, unspecified: Principal | ICD-10-CM

## 2023-07-13 DIAGNOSIS — D8989 Other specified disorders involving the immune mechanism, not elsewhere classified: Principal | ICD-10-CM

## 2023-07-13 MED ORDER — DEXAMETHASONE 4 MG TABLET
ORAL_TABLET | 0 refills | 0 days | Status: CP
Start: 2023-07-13 — End: ?

## 2023-07-21 MED ORDER — PREDNISONE 5 MG TABLET
ORAL_TABLET | ORAL | 0 refills | 30 days | Status: CP
Start: 2023-07-21 — End: 2023-08-20

## 2023-07-26 ENCOUNTER — Ambulatory Visit: Admit: 2023-07-26 | Discharge: 2023-07-27 | Payer: MEDICARE

## 2023-07-26 ENCOUNTER — Other Ambulatory Visit: Admit: 2023-07-26 | Discharge: 2023-07-27 | Payer: MEDICARE

## 2023-07-26 DIAGNOSIS — C549 Malignant neoplasm of corpus uteri, unspecified: Principal | ICD-10-CM

## 2023-07-26 DIAGNOSIS — R790 Abnormal level of blood mineral: Principal | ICD-10-CM

## 2023-07-27 DIAGNOSIS — N3000 Acute cystitis without hematuria: Principal | ICD-10-CM

## 2023-07-27 MED ORDER — NITROFURANTOIN MONOHYDRATE/MACROCRYSTALS 100 MG CAPSULE
ORAL_CAPSULE | Freq: Two times a day (BID) | ORAL | 0 refills | 5 days | Status: CP
Start: 2023-07-27 — End: 2023-08-01

## 2023-08-03 ENCOUNTER — Other Ambulatory Visit: Admit: 2023-08-03 | Discharge: 2023-08-04 | Payer: MEDICARE

## 2023-08-03 ENCOUNTER — Ambulatory Visit: Admit: 2023-08-03 | Discharge: 2023-08-04 | Payer: MEDICARE

## 2023-08-03 DIAGNOSIS — D8989 Other specified disorders involving the immune mechanism, not elsewhere classified: Principal | ICD-10-CM

## 2023-08-03 DIAGNOSIS — R899 Unspecified abnormal finding in specimens from other organs, systems and tissues: Principal | ICD-10-CM

## 2023-08-03 DIAGNOSIS — C549 Malignant neoplasm of corpus uteri, unspecified: Principal | ICD-10-CM

## 2023-08-03 DIAGNOSIS — C801 Malignant (primary) neoplasm, unspecified: Principal | ICD-10-CM

## 2023-08-03 DIAGNOSIS — R799 Abnormal finding of blood chemistry, unspecified: Principal | ICD-10-CM

## 2023-08-04 DIAGNOSIS — C801 Malignant (primary) neoplasm, unspecified: Principal | ICD-10-CM

## 2023-08-04 DIAGNOSIS — C549 Malignant neoplasm of corpus uteri, unspecified: Principal | ICD-10-CM

## 2023-08-04 DIAGNOSIS — R634 Abnormal weight loss: Principal | ICD-10-CM

## 2023-08-04 DIAGNOSIS — R630 Anorexia: Principal | ICD-10-CM

## 2023-08-04 MED ORDER — DULOXETINE 20 MG CAPSULE,DELAYED RELEASE
ORAL_CAPSULE | Freq: Every day | ORAL | 3 refills | 30.00000 days | Status: CP
Start: 2023-08-04 — End: ?

## 2023-08-04 MED ORDER — ONDANSETRON 4 MG DISINTEGRATING TABLET
ORAL_TABLET | Freq: Three times a day (TID) | ORAL | 0 refills | 10 days | PRN
Start: 2023-08-04 — End: ?

## 2023-08-05 DIAGNOSIS — C549 Malignant neoplasm of corpus uteri, unspecified: Principal | ICD-10-CM

## 2023-08-05 DIAGNOSIS — C801 Malignant (primary) neoplasm, unspecified: Principal | ICD-10-CM

## 2023-08-05 MED ORDER — ONDANSETRON 4 MG DISINTEGRATING TABLET
ORAL_TABLET | Freq: Three times a day (TID) | ORAL | 0 refills | 10 days | Status: CP | PRN
Start: 2023-08-05 — End: ?

## 2023-08-09 ENCOUNTER — Ambulatory Visit: Admit: 2023-08-09 | Discharge: 2023-08-10 | Payer: MEDICARE

## 2023-08-10 ENCOUNTER — Ambulatory Visit: Admit: 2023-08-10 | Discharge: 2023-08-11 | Payer: MEDICARE

## 2023-08-10 DIAGNOSIS — C801 Malignant (primary) neoplasm, unspecified: Principal | ICD-10-CM

## 2023-08-10 DIAGNOSIS — C549 Malignant neoplasm of corpus uteri, unspecified: Principal | ICD-10-CM

## 2023-08-10 DIAGNOSIS — R899 Unspecified abnormal finding in specimens from other organs, systems and tissues: Principal | ICD-10-CM

## 2023-08-10 DIAGNOSIS — R058 Other cough: Principal | ICD-10-CM

## 2023-08-10 DIAGNOSIS — D8989 Other specified disorders involving the immune mechanism, not elsewhere classified: Principal | ICD-10-CM

## 2023-08-10 DIAGNOSIS — R799 Abnormal finding of blood chemistry, unspecified: Principal | ICD-10-CM

## 2023-08-10 DIAGNOSIS — N179 Acute kidney failure, unspecified: Principal | ICD-10-CM

## 2023-08-10 MED ORDER — HYDROCODONE-HOMATROPINE 5 MG-1.5 MG/5 ML ORAL SYRUP
ORAL | 0 refills | 16 days | Status: CP | PRN
Start: 2023-08-10 — End: ?

## 2023-08-10 MED ORDER — APIXABAN 5 MG TABLET
ORAL_TABLET | Freq: Two times a day (BID) | ORAL | 5 refills | 30 days | Status: CP
Start: 2023-08-10 — End: ?

## 2023-08-10 MED ORDER — MIRTAZAPINE 7.5 MG TABLET
ORAL_TABLET | Freq: Every evening | ORAL | 2 refills | 30 days | Status: CP
Start: 2023-08-10 — End: ?

## 2023-08-12 ENCOUNTER — Institutional Professional Consult (permissible substitution): Admit: 2023-08-12 | Discharge: 2023-08-12 | Payer: MEDICARE

## 2023-08-12 ENCOUNTER — Ambulatory Visit: Admit: 2023-08-12 | Discharge: 2023-08-12 | Payer: MEDICARE

## 2023-08-12 DIAGNOSIS — C801 Malignant (primary) neoplasm, unspecified: Principal | ICD-10-CM

## 2023-08-12 DIAGNOSIS — R899 Unspecified abnormal finding in specimens from other organs, systems and tissues: Principal | ICD-10-CM

## 2023-08-12 DIAGNOSIS — C549 Malignant neoplasm of corpus uteri, unspecified: Principal | ICD-10-CM

## 2023-08-12 DIAGNOSIS — N179 Acute kidney failure, unspecified: Principal | ICD-10-CM

## 2023-08-12 DIAGNOSIS — R799 Abnormal finding of blood chemistry, unspecified: Principal | ICD-10-CM

## 2023-08-12 DIAGNOSIS — D8989 Other specified disorders involving the immune mechanism, not elsewhere classified: Principal | ICD-10-CM

## 2023-08-16 DIAGNOSIS — N179 Acute kidney failure, unspecified: Principal | ICD-10-CM

## 2023-08-16 DIAGNOSIS — C801 Malignant (primary) neoplasm, unspecified: Principal | ICD-10-CM

## 2023-08-18 ENCOUNTER — Emergency Department
Admission: EM | Admit: 2023-08-18 | Discharge: 2023-08-18 | Disposition: A | Discharge status: Home / Self Care | Payer: BC Managed Care – PPO | Attending: Emergency Medicine | Admitting: Emergency Medicine

## 2023-08-18 ENCOUNTER — Other Ambulatory Visit: Payer: Self-pay

## 2023-08-18 ENCOUNTER — Encounter: Payer: Self-pay | Admitting: *Deleted

## 2023-08-18 DIAGNOSIS — C539 Malignant neoplasm of cervix uteri, unspecified: Secondary | ICD-10-CM | POA: Diagnosis not present

## 2023-08-18 DIAGNOSIS — R630 Anorexia: Secondary | ICD-10-CM | POA: Diagnosis not present

## 2023-08-18 DIAGNOSIS — R300 Dysuria: Secondary | ICD-10-CM | POA: Diagnosis not present

## 2023-08-18 DIAGNOSIS — R55 Syncope and collapse: Secondary | ICD-10-CM | POA: Insufficient documentation

## 2023-08-18 DIAGNOSIS — E860 Dehydration: Secondary | ICD-10-CM | POA: Insufficient documentation

## 2023-08-18 LAB — BASIC METABOLIC PANEL
Anion gap: 9 (ref 5–15)
BUN: 11 mg/dL (ref 8–23)
CO2: 25 mmol/L (ref 22–32)
Calcium: 8.5 mg/dL — ABNORMAL LOW (ref 8.9–10.3)
Chloride: 105 mmol/L (ref 98–111)
Creatinine, Ser: 0.94 mg/dL (ref 0.44–1.00)
GFR, Estimated: >60 mL/min (ref >60)
Glucose, Bld: 118 mg/dL — ABNORMAL HIGH (ref 70–99)
Potassium: 3.3 mmol/L — ABNORMAL LOW (ref 3.5–5.1)
Sodium: 139 mmol/L (ref 135–145)

## 2023-08-18 LAB — CBC
HCT: 30.7 % — ABNORMAL LOW (ref 36.0–46.0)
Hemoglobin: 10 g/dL — ABNORMAL LOW (ref 12.0–15.0)
MCH: 30.9 pg (ref 26.0–34.0)
MCHC: 32.6 g/dL (ref 30.0–36.0)
MCV: 94.8 fL (ref 80.0–100.0)
Platelets: 285 10*3/uL (ref 150–400)
RBC: 3.24 MIL/uL — ABNORMAL LOW (ref 3.87–5.11)
RDW: 16.8 % — ABNORMAL HIGH (ref 11.5–15.5)
WBC: 7.2 10*3/uL (ref 4.0–10.5)
nRBC: 0.8 % — ABNORMAL HIGH (ref 0.0–0.2)

## 2023-08-18 LAB — URINALYSIS, ROUTINE W REFLEX MICROSCOPIC
Bilirubin Urine: NEGATIVE
Glucose, UA: NEGATIVE mg/dL
Hgb urine dipstick: NEGATIVE
Ketones, ur: NEGATIVE mg/dL
Leukocytes,Ua: NEGATIVE
Nitrite: NEGATIVE
Protein, ur: 30 mg/dL — AB
Specific Gravity, Urine: 1.011 (ref 1.005–1.030)
pH: 8 (ref 5.0–8.0)

## 2023-08-18 LAB — MAGNESIUM: Magnesium: 1.9 mg/dL (ref 1.7–2.4)

## 2023-08-18 MED ORDER — POTASSIUM CHLORIDE 20 MEQ PO PACK
40.0000 meq | PACK | Freq: Once | ORAL | Status: AC
  Administered 2023-08-18: 40 meq via ORAL
  Filled 2023-08-18: qty 2

## 2023-08-18 MED ORDER — SODIUM CHLORIDE 0.9 % IV BOLUS
1000.0000 mL | Freq: Once | INTRAVENOUS | Status: AC
  Administered 2023-08-18: 1000 mL via INTRAVENOUS
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
## 2023-08-18 NOTE — Discharge Instructions (Signed)
You were seen in the emergency department after passing out (syncope). Please arrange follow-up with your primary care doctor or oncologist in the next few days for further evaluation of your symptoms.  Make sure you are staying hydrated is much as possible.  Use your prescribed nausea medicine as needed.  Return to the ER immediately if you develop chest pain, shortness of breath, it feels like your heart is racing, repeated episodes, or other new or concerning symptoms.

## 2023-08-18 NOTE — ED Notes (Signed)
First Nurse Note: Pt to ED via [facility] from home for syncopal episode. Per EMS Pt was walking into the kitchen and passed out. Pts husband caught her and lowered her to the ground.EMS reports that pt has not been eating or drinking like she should and thinks it may be from possible dehydration. EMS reports that pt has urinary incontinence. Pt is also being treated at [facility] for lung cancer.

## 2023-08-18 NOTE — ED Notes (Signed)
Pt was able to ambulate to the hallway with walker without any issue. She's tolerating her po fluids also

## 2023-08-18 NOTE — ED Provider Notes (Signed)
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  Patient's last chemo session was last Friday.  Since then, she has had some decreased appetite and p.o. intake without vomiting or diarrhea.  No abdominal pain.  Today, she had been feeling generally weak and had an episode where she felt lightheaded with witnessed syncope.  Her husband caught her, no injuries.  Was treated for UTI about a week ago denies any ongoing urinary symptoms.     Physical Exam   Triage Vital Signs: ED Triage Vitals  Encounter Vitals Group     BP 08/18/23 1500 95/63     Systolic BP Percentile --      Diastolic BP Percentile --      Pulse Rate 08/18/23 1500 70     Resp 08/18/23 1500 20     Temp 08/18/23 1500 97.8 F (36.6 C)     Temp Source 08/18/23 1500 Oral     SpO2 08/18/23 1500 96 %     Weight 08/18/23 1500 128 lb (58.1 kg)     Height 08/18/23 1500 5' (1.524 m)     Head Circumference --      Peak Flow --      Pain Score 08/18/23 1505 0     Pain Loc --      Pain Education --      Exclude from Growth Chart --     Most recent vital signs: Vitals:   08/18/23 1630 08/18/23 1700  BP: (!) 102/43 (!) 109/58  Pulse: (!) 56 74  Resp: (!) 7 (!) 22  Temp:    SpO2:       General: Awake, interactive  HEENT: Mucous membrane slightly dry CV:  Regular rate, good peripheral perfusion.  Resp:  Lungs clear, unlabored respirations.  Abd:  Soft, nondistended, nontender to palpation Neuro:  Symmetric facial movement, fluid speech, 5-5 strength of bilateral upper and lower extremities with normal sensation   ED Results / Procedures / Treatments   Labs (all labs ordered are listed, but only abnormal  results are displayed) Labs Reviewed  BASIC METABOLIC PANEL - Abnormal; Notable for the following components:      Result Value   Potassium 3.3 (*)    Glucose, Bld 118 (*)    Calcium 8.5 (*)    All other components within normal limits  CBC - Abnormal; Notable for the following components:   RBC 3.24 (*)    Hemoglobin 10.0 (*)    HCT 30.7 (*)    RDW 16.8 (*)    nRBC 0.8 (*)    All other components within normal limits  URINALYSIS, ROUTINE W REFLEX MICROSCOPIC - Abnormal; Notable for the following components:   Color, Urine YELLOW (*)    APPearance CLEAR (*)    Protein, ur 30 (*)    Bacteria, UA RARE (*)    All other components within normal limits  URINE CULTURE  MAGNESIUM     EKG EKG independently reviewed interpreted by myself (ER attending) demonstrates:  EKG demonstrates heart rate of 61, PR 132, QRS 84, QTc 442, no acute ST changes noted  RADIOLOGY Imaging independently reviewed and interpreted by myself demonstrates:  PROCEDURES:  Critical Care performed: No  Procedures   MEDICATIONS ORDERED IN ED: Medications  sodium chloride 0.9 % bolus 1,000 mL (0 mLs Intravenous Stopped 08/18/23 1715)  potassium chloride (KLOR-CON) packet 40 mEq (40 mEq Oral Given 08/18/23 1708)     IMPRESSION / MDM / ASSESSMENT AND PLAN / ED COURSE  I reviewed the triage vital signs and the nursing notes.  Differential diagnosis includes, but is not limited to, anemia, electrolyte abnormality, dehydration, incompletely treated UTI, arrhythmia, very low suspicion ACS, PE in the absence of cardiorespiratory symptoms  Patient's presentation is most consistent with acute presentation with potential threat to life or bodily function.  72 year old female presenting to the emergency department following a syncopal episode in the setting of poor p.o. intake.  Clinical history Mo suggestive of vasovagal syncope in the setting of dehydration.  No focal neurologic deficits.  No chest pain,  shortness of breath.  Labs from triage with stable to improved anemia from prior and outside records.  Slightly low potassium, given oral repletion.  Urine overall not suggestive of infection, but with recent UTI we will send culture.  After IV fluids, patient felt much improved.  She was able to ambulate without any recurrent lightheadedness.  She is comfortable with discharge home.  Strict return precautions provided.      FINAL CLINICAL IMPRESSION(S) / ED DIAGNOSES   Final diagnoses:  Syncope and collapse  Dehydration     Rx / DC Orders   ED Discharge Orders     None        Note:  This document was prepared using Dragon voice recognition software and may include unintentional dictation errors.   Trinna Post, MD 08/19/23 [phone]

## 2023-08-20 LAB — URINE CULTURE: Culture: <10000 — AB

## 2023-08-24 ENCOUNTER — Ambulatory Visit: Admit: 2023-08-24 | Payer: MEDICARE

## 2023-08-24 ENCOUNTER — Ambulatory Visit: Admit: 2023-08-24 | Discharge: 2023-08-24 | Payer: MEDICARE

## 2023-08-24 ENCOUNTER — Ambulatory Visit: Admit: 2023-08-24 | Discharge: 2023-08-24 | Payer: MEDICARE | Attending: Registered" | Primary: Registered"

## 2023-08-24 DIAGNOSIS — N179 Acute kidney failure, unspecified: Principal | ICD-10-CM

## 2023-08-24 DIAGNOSIS — C801 Malignant (primary) neoplasm, unspecified: Principal | ICD-10-CM

## 2023-08-25 MED ORDER — BENZONATATE 100 MG CAPSULE
ORAL_CAPSULE | Freq: Three times a day (TID) | ORAL | 1 refills | 10.00000 days | Status: CP | PRN
Start: 2023-08-25 — End: ?

## 2023-08-26 DIAGNOSIS — C801 Malignant (primary) neoplasm, unspecified: Principal | ICD-10-CM

## 2023-08-26 DIAGNOSIS — N179 Acute kidney failure, unspecified: Principal | ICD-10-CM

## 2023-08-26 DIAGNOSIS — C549 Malignant neoplasm of corpus uteri, unspecified: Principal | ICD-10-CM

## 2023-08-26 MED ORDER — ONDANSETRON 4 MG DISINTEGRATING TABLET
ORAL_TABLET | Freq: Three times a day (TID) | ORAL | 2 refills | 20 days | Status: CP | PRN
Start: 2023-08-26 — End: ?

## 2023-08-31 ENCOUNTER — Ambulatory Visit: Admit: 2023-08-31 | Discharge: 2023-09-01 | Payer: MEDICARE

## 2023-08-31 DIAGNOSIS — C801 Malignant (primary) neoplasm, unspecified: Principal | ICD-10-CM

## 2023-08-31 DIAGNOSIS — N179 Acute kidney failure, unspecified: Principal | ICD-10-CM

## 2023-09-07 ENCOUNTER — Ambulatory Visit: Admit: 2023-09-07 | Discharge: 2023-09-08 | Payer: MEDICARE | Attending: Registered" | Primary: Registered"

## 2023-09-07 ENCOUNTER — Ambulatory Visit: Admit: 2023-09-07 | Discharge: 2023-09-08 | Payer: MEDICARE

## 2023-09-07 ENCOUNTER — Other Ambulatory Visit: Admit: 2023-09-07 | Discharge: 2023-09-08 | Payer: MEDICARE

## 2023-09-07 DIAGNOSIS — C549 Malignant neoplasm of corpus uteri, unspecified: Principal | ICD-10-CM

## 2023-09-07 DIAGNOSIS — R799 Abnormal finding of blood chemistry, unspecified: Principal | ICD-10-CM

## 2023-09-07 DIAGNOSIS — D8989 Other specified disorders involving the immune mechanism, not elsewhere classified: Principal | ICD-10-CM

## 2023-09-07 DIAGNOSIS — C801 Malignant (primary) neoplasm, unspecified: Principal | ICD-10-CM

## 2023-09-07 DIAGNOSIS — R899 Unspecified abnormal finding in specimens from other organs, systems and tissues: Principal | ICD-10-CM

## 2023-09-14 ENCOUNTER — Ambulatory Visit: Admit: 2023-09-14 | Discharge: 2023-09-14 | Payer: MEDICARE

## 2023-09-14 DIAGNOSIS — C801 Malignant (primary) neoplasm, unspecified: Principal | ICD-10-CM

## 2023-09-14 DIAGNOSIS — N179 Acute kidney failure, unspecified: Principal | ICD-10-CM

## 2023-09-20 ENCOUNTER — Ambulatory Visit: Admit: 2023-09-20 | Discharge: 2023-09-21 | Payer: MEDICARE

## 2023-09-20 DIAGNOSIS — C801 Malignant (primary) neoplasm, unspecified: Principal | ICD-10-CM

## 2023-09-20 DIAGNOSIS — T458X5A Adverse effect of other primarily systemic and hematological agents, initial encounter: Principal | ICD-10-CM

## 2023-09-20 DIAGNOSIS — C549 Malignant neoplasm of corpus uteri, unspecified: Principal | ICD-10-CM

## 2023-09-20 MED ORDER — ONDANSETRON 4 MG DISINTEGRATING TABLET
ORAL_TABLET | Freq: Three times a day (TID) | ORAL | 2 refills | 20 days | Status: CP | PRN
Start: 2023-09-20 — End: ?

## 2023-09-21 ENCOUNTER — Ambulatory Visit: Admit: 2023-09-21 | Discharge: 2023-09-22 | Payer: MEDICARE

## 2023-09-21 DIAGNOSIS — N179 Acute kidney failure, unspecified: Principal | ICD-10-CM

## 2023-09-21 DIAGNOSIS — C801 Malignant (primary) neoplasm, unspecified: Principal | ICD-10-CM

## 2023-09-23 DIAGNOSIS — N179 Acute kidney failure, unspecified: Principal | ICD-10-CM

## 2023-09-23 DIAGNOSIS — C801 Malignant (primary) neoplasm, unspecified: Principal | ICD-10-CM

## 2023-09-26 ENCOUNTER — Ambulatory Visit: Admit: 2023-09-26 | Discharge: 2023-09-26 | Payer: MEDICARE

## 2023-09-28 ENCOUNTER — Other Ambulatory Visit: Admit: 2023-09-28 | Discharge: 2023-09-28 | Payer: MEDICARE

## 2023-09-28 ENCOUNTER — Ambulatory Visit: Admit: 2023-09-28 | Discharge: 2023-09-28 | Payer: MEDICARE

## 2023-09-28 DIAGNOSIS — N179 Acute kidney failure, unspecified: Principal | ICD-10-CM

## 2023-09-28 DIAGNOSIS — R799 Abnormal finding of blood chemistry, unspecified: Principal | ICD-10-CM

## 2023-09-28 DIAGNOSIS — C801 Malignant (primary) neoplasm, unspecified: Principal | ICD-10-CM

## 2023-09-28 DIAGNOSIS — C549 Malignant neoplasm of corpus uteri, unspecified: Principal | ICD-10-CM

## 2023-09-28 DIAGNOSIS — R899 Unspecified abnormal finding in specimens from other organs, systems and tissues: Principal | ICD-10-CM

## 2023-09-28 DIAGNOSIS — D8989 Other specified disorders involving the immune mechanism, not elsewhere classified: Principal | ICD-10-CM

## 2023-09-28 DIAGNOSIS — Z79899 Other long term (current) drug therapy: Principal | ICD-10-CM

## 2023-09-29 ENCOUNTER — Telehealth: Admit: 2023-09-29 | Discharge: 2023-09-30 | Payer: MEDICARE | Attending: Clinical | Primary: Clinical

## 2023-09-29 DIAGNOSIS — C801 Malignant (primary) neoplasm, unspecified: Principal | ICD-10-CM

## 2023-09-29 DIAGNOSIS — N179 Acute kidney failure, unspecified: Principal | ICD-10-CM

## 2023-09-29 DIAGNOSIS — G620 Drug-induced polyneuropathy: Principal | ICD-10-CM

## 2023-09-29 DIAGNOSIS — T451X5A Adverse effect of antineoplastic and immunosuppressive drugs, initial encounter: Principal | ICD-10-CM

## 2023-09-29 DIAGNOSIS — C549 Malignant neoplasm of corpus uteri, unspecified: Principal | ICD-10-CM

## 2023-10-05 ENCOUNTER — Other Ambulatory Visit: Admit: 2023-10-05 | Discharge: 2023-10-06 | Payer: MEDICARE

## 2023-10-05 ENCOUNTER — Ambulatory Visit: Admit: 2023-10-05 | Discharge: 2023-10-06 | Payer: MEDICARE

## 2023-10-05 DIAGNOSIS — R899 Unspecified abnormal finding in specimens from other organs, systems and tissues: Principal | ICD-10-CM

## 2023-10-05 DIAGNOSIS — T451X5A Adverse effect of antineoplastic and immunosuppressive drugs, initial encounter: Principal | ICD-10-CM

## 2023-10-05 DIAGNOSIS — G620 Drug-induced polyneuropathy: Principal | ICD-10-CM

## 2023-10-05 DIAGNOSIS — C549 Malignant neoplasm of corpus uteri, unspecified: Principal | ICD-10-CM

## 2023-10-05 DIAGNOSIS — R799 Abnormal finding of blood chemistry, unspecified: Principal | ICD-10-CM

## 2023-10-05 DIAGNOSIS — C801 Malignant (primary) neoplasm, unspecified: Principal | ICD-10-CM

## 2023-10-05 DIAGNOSIS — N179 Acute kidney failure, unspecified: Principal | ICD-10-CM

## 2023-10-05 DIAGNOSIS — D8989 Other specified disorders involving the immune mechanism, not elsewhere classified: Principal | ICD-10-CM

## 2023-10-06 ENCOUNTER — Ambulatory Visit: Admit: 2023-10-06 | Discharge: 2023-10-07 | Payer: MEDICARE

## 2023-10-06 DIAGNOSIS — R899 Unspecified abnormal finding in specimens from other organs, systems and tissues: Principal | ICD-10-CM

## 2023-10-06 DIAGNOSIS — D8989 Other specified disorders involving the immune mechanism, not elsewhere classified: Principal | ICD-10-CM

## 2023-10-06 DIAGNOSIS — C801 Malignant (primary) neoplasm, unspecified: Principal | ICD-10-CM

## 2023-10-06 DIAGNOSIS — C549 Malignant neoplasm of corpus uteri, unspecified: Principal | ICD-10-CM

## 2023-10-06 DIAGNOSIS — R799 Abnormal finding of blood chemistry, unspecified: Principal | ICD-10-CM

## 2023-10-06 MED ORDER — DEXAMETHASONE 4 MG TABLET
ORAL_TABLET | Freq: Every day | ORAL | 0 refills | 10 days | Status: CP
Start: 2023-10-06 — End: ?

## 2023-10-11 DIAGNOSIS — C549 Malignant neoplasm of corpus uteri, unspecified: Principal | ICD-10-CM

## 2023-10-12 ENCOUNTER — Ambulatory Visit: Admit: 2023-10-12 | Discharge: 2023-10-13 | Payer: MEDICARE

## 2023-10-12 DIAGNOSIS — C801 Malignant (primary) neoplasm, unspecified: Principal | ICD-10-CM

## 2023-10-12 DIAGNOSIS — N179 Acute kidney failure, unspecified: Principal | ICD-10-CM

## 2023-10-14 DIAGNOSIS — C801 Malignant (primary) neoplasm, unspecified: Principal | ICD-10-CM

## 2023-10-19 ENCOUNTER — Ambulatory Visit: Admit: 2023-10-19 | Discharge: 2023-10-20 | Payer: MEDICARE

## 2023-10-19 DIAGNOSIS — C801 Malignant (primary) neoplasm, unspecified: Principal | ICD-10-CM

## 2023-10-19 DIAGNOSIS — N179 Acute kidney failure, unspecified: Principal | ICD-10-CM

## 2023-10-25 DIAGNOSIS — C801 Malignant (primary) neoplasm, unspecified: Principal | ICD-10-CM

## 2023-10-27 ENCOUNTER — Ambulatory Visit: Admit: 2023-10-27 | Discharge: 2023-10-28 | Payer: MEDICARE

## 2023-10-27 ENCOUNTER — Other Ambulatory Visit: Admit: 2023-10-27 | Discharge: 2023-10-28 | Payer: MEDICARE

## 2023-10-27 DIAGNOSIS — C801 Malignant (primary) neoplasm, unspecified: Principal | ICD-10-CM

## 2023-10-27 DIAGNOSIS — H6122 Impacted cerumen, left ear: Principal | ICD-10-CM

## 2023-10-27 DIAGNOSIS — R899 Unspecified abnormal finding in specimens from other organs, systems and tissues: Principal | ICD-10-CM

## 2023-10-27 DIAGNOSIS — R799 Abnormal finding of blood chemistry, unspecified: Principal | ICD-10-CM

## 2023-10-27 DIAGNOSIS — N179 Acute kidney failure, unspecified: Principal | ICD-10-CM

## 2023-10-27 DIAGNOSIS — M87180 Osteonecrosis due to drugs, jaw: Principal | ICD-10-CM

## 2023-10-27 DIAGNOSIS — C549 Malignant neoplasm of corpus uteri, unspecified: Principal | ICD-10-CM

## 2023-10-27 DIAGNOSIS — D8989 Other specified disorders involving the immune mechanism, not elsewhere classified: Principal | ICD-10-CM

## 2023-10-27 DIAGNOSIS — T451X5A Adverse effect of antineoplastic and immunosuppressive drugs, initial encounter: Principal | ICD-10-CM

## 2023-10-27 DIAGNOSIS — T458X5A Adverse effect of other primarily systemic and hematological agents, initial encounter: Principal | ICD-10-CM

## 2023-10-27 DIAGNOSIS — R0982 Postnasal drip: Principal | ICD-10-CM

## 2023-10-27 DIAGNOSIS — Z9189 Other specified personal risk factors, not elsewhere classified: Principal | ICD-10-CM

## 2023-10-27 MED ORDER — DEXAMETHASONE 4 MG TABLET
ORAL_TABLET | Freq: Every day | ORAL | 0 refills | 12 days | Status: CP
Start: 2023-10-27 — End: ?

## 2023-10-28 DIAGNOSIS — C801 Malignant (primary) neoplasm, unspecified: Principal | ICD-10-CM

## 2023-10-31 DIAGNOSIS — C801 Malignant (primary) neoplasm, unspecified: Principal | ICD-10-CM

## 2023-11-01 DIAGNOSIS — C801 Malignant (primary) neoplasm, unspecified: Principal | ICD-10-CM

## 2023-11-02 ENCOUNTER — Ambulatory Visit: Admit: 2023-11-02 | Discharge: 2023-11-03 | Payer: MEDICARE

## 2023-11-02 DIAGNOSIS — N179 Acute kidney failure, unspecified: Principal | ICD-10-CM

## 2023-11-02 DIAGNOSIS — C801 Malignant (primary) neoplasm, unspecified: Principal | ICD-10-CM

## 2023-11-07 ENCOUNTER — Ambulatory Visit: Admit: 2023-11-07 | Discharge: 2023-11-08 | Payer: MEDICARE

## 2023-11-07 DIAGNOSIS — N179 Acute kidney failure, unspecified: Principal | ICD-10-CM

## 2023-11-07 DIAGNOSIS — C801 Malignant (primary) neoplasm, unspecified: Principal | ICD-10-CM

## 2023-11-14 ENCOUNTER — Ambulatory Visit: Admit: 2023-11-14 | Discharge: 2023-11-15 | Payer: MEDICARE

## 2023-11-14 DIAGNOSIS — C801 Malignant (primary) neoplasm, unspecified: Principal | ICD-10-CM

## 2023-11-14 DIAGNOSIS — N179 Acute kidney failure, unspecified: Principal | ICD-10-CM

## 2023-11-21 ENCOUNTER — Ambulatory Visit: Admit: 2023-11-21 | Discharge: 2023-11-22 | Payer: MEDICARE

## 2023-11-21 DIAGNOSIS — N179 Acute kidney failure, unspecified: Principal | ICD-10-CM

## 2023-11-21 DIAGNOSIS — C801 Malignant (primary) neoplasm, unspecified: Principal | ICD-10-CM

## 2023-11-22 DIAGNOSIS — C801 Malignant (primary) neoplasm, unspecified: Principal | ICD-10-CM

## 2023-11-24 DIAGNOSIS — C801 Malignant (primary) neoplasm, unspecified: Principal | ICD-10-CM

## 2023-11-25 ENCOUNTER — Inpatient Hospital Stay: Admit: 2023-11-25 | Discharge: 2023-11-26 | Payer: MEDICARE

## 2023-11-25 ENCOUNTER — Other Ambulatory Visit: Admit: 2023-11-25 | Discharge: 2023-11-26 | Payer: MEDICARE

## 2023-11-25 ENCOUNTER — Encounter: Admit: 2023-11-25 | Discharge: 2023-11-26 | Payer: MEDICARE

## 2023-11-25 ENCOUNTER — Ambulatory Visit: Admit: 2023-11-25 | Discharge: 2023-11-26 | Payer: MEDICARE

## 2023-11-25 DIAGNOSIS — D8989 Other specified disorders involving the immune mechanism, not elsewhere classified: Principal | ICD-10-CM

## 2023-11-25 DIAGNOSIS — C549 Malignant neoplasm of corpus uteri, unspecified: Principal | ICD-10-CM

## 2023-11-25 DIAGNOSIS — T451X5A Adverse effect of antineoplastic and immunosuppressive drugs, initial encounter: Principal | ICD-10-CM

## 2023-11-25 DIAGNOSIS — E876 Hypokalemia: Principal | ICD-10-CM

## 2023-11-25 DIAGNOSIS — R899 Unspecified abnormal finding in specimens from other organs, systems and tissues: Principal | ICD-10-CM

## 2023-11-25 DIAGNOSIS — R799 Abnormal finding of blood chemistry, unspecified: Principal | ICD-10-CM

## 2023-11-25 DIAGNOSIS — C801 Malignant (primary) neoplasm, unspecified: Principal | ICD-10-CM

## 2023-11-25 DIAGNOSIS — C548 Malignant neoplasm of overlapping sites of corpus uteri: Principal | ICD-10-CM

## 2023-11-25 DIAGNOSIS — H6120 Impacted cerumen, unspecified ear: Principal | ICD-10-CM

## 2023-11-25 DIAGNOSIS — D6481 Anemia due to antineoplastic chemotherapy: Principal | ICD-10-CM

## 2023-11-25 MED ORDER — DULOXETINE 20 MG CAPSULE,DELAYED RELEASE
ORAL_CAPSULE | Freq: Every day | ORAL | 3 refills | 30.00000 days | Status: CP
Start: 2023-11-25 — End: 2024-03-24

## 2023-11-25 MED ORDER — POTASSIUM CHLORIDE 20 MEQ ORAL PACKET
PACK | Freq: Every day | ORAL | 3 refills | 30.00 days | Status: CP
Start: 2023-11-25 — End: ?

## 2023-11-27 DIAGNOSIS — C549 Malignant neoplasm of corpus uteri, unspecified: Principal | ICD-10-CM

## 2023-11-27 DIAGNOSIS — R799 Abnormal finding of blood chemistry, unspecified: Principal | ICD-10-CM

## 2023-11-27 DIAGNOSIS — C801 Malignant (primary) neoplasm, unspecified: Principal | ICD-10-CM

## 2023-11-27 DIAGNOSIS — R899 Unspecified abnormal finding in specimens from other organs, systems and tissues: Principal | ICD-10-CM

## 2023-11-27 DIAGNOSIS — D8989 Other specified disorders involving the immune mechanism, not elsewhere classified: Principal | ICD-10-CM

## 2023-12-05 ENCOUNTER — Encounter: Admit: 2023-12-05 | Discharge: 2023-12-06 | Payer: MEDICARE

## 2023-12-05 DIAGNOSIS — N179 Acute kidney failure, unspecified: Principal | ICD-10-CM

## 2023-12-05 DIAGNOSIS — C801 Malignant (primary) neoplasm, unspecified: Principal | ICD-10-CM

## 2023-12-10 ENCOUNTER — Emergency Department: Admit: 2023-12-10 | Discharge: 2023-12-11 | Disposition: A | Discharge status: Home / Self Care | Payer: MEDICARE

## 2023-12-13 DIAGNOSIS — C801 Malignant (primary) neoplasm, unspecified: Principal | ICD-10-CM

## 2023-12-16 ENCOUNTER — Encounter: Admit: 2023-12-16 | Discharge: 2023-12-17 | Payer: MEDICARE

## 2023-12-16 DIAGNOSIS — N179 Acute kidney failure, unspecified: Principal | ICD-10-CM

## 2023-12-16 DIAGNOSIS — C801 Malignant (primary) neoplasm, unspecified: Principal | ICD-10-CM

## 2023-12-20 DIAGNOSIS — C801 Malignant (primary) neoplasm, unspecified: Principal | ICD-10-CM

## 2023-12-21 ENCOUNTER — Ambulatory Visit: Admit: 2023-12-21 | Discharge: 2023-12-21 | Payer: MEDICARE

## 2023-12-21 ENCOUNTER — Other Ambulatory Visit: Admit: 2023-12-21 | Discharge: 2023-12-21 | Payer: MEDICARE

## 2023-12-21 ENCOUNTER — Inpatient Hospital Stay: Admit: 2023-12-21 | Discharge: 2023-12-21 | Payer: MEDICARE

## 2023-12-21 DIAGNOSIS — C801 Malignant (primary) neoplasm, unspecified: Principal | ICD-10-CM

## 2023-12-21 DIAGNOSIS — C549 Malignant neoplasm of corpus uteri, unspecified: Principal | ICD-10-CM

## 2023-12-21 DIAGNOSIS — R899 Unspecified abnormal finding in specimens from other organs, systems and tissues: Principal | ICD-10-CM

## 2023-12-21 DIAGNOSIS — R799 Abnormal finding of blood chemistry, unspecified: Principal | ICD-10-CM

## 2023-12-21 DIAGNOSIS — C548 Malignant neoplasm of overlapping sites of corpus uteri: Principal | ICD-10-CM

## 2023-12-21 DIAGNOSIS — D8989 Other specified disorders involving the immune mechanism, not elsewhere classified: Principal | ICD-10-CM

## 2023-12-21 MED ORDER — APIXABAN 5 MG TABLET
ORAL_TABLET | Freq: Two times a day (BID) | ORAL | 5 refills | 30.00 days | Status: CP
Start: 2023-12-21 — End: ?

## 2023-12-21 MED ORDER — DEXAMETHASONE 4 MG TABLET
ORAL_TABLET | Freq: Every day | ORAL | 0 refills | 12.00 days | Status: CP
Start: 2023-12-21 — End: ?

## 2023-12-22 DIAGNOSIS — C801 Malignant (primary) neoplasm, unspecified: Principal | ICD-10-CM

## 2023-12-25 DIAGNOSIS — N179 Acute kidney failure, unspecified: Principal | ICD-10-CM

## 2023-12-25 DIAGNOSIS — C801 Malignant (primary) neoplasm, unspecified: Principal | ICD-10-CM

## 2023-12-26 DIAGNOSIS — C801 Malignant (primary) neoplasm, unspecified: Principal | ICD-10-CM

## 2023-12-27 DIAGNOSIS — C801 Malignant (primary) neoplasm, unspecified: Principal | ICD-10-CM

## 2023-12-28 ENCOUNTER — Inpatient Hospital Stay: Admit: 2023-12-28 | Discharge: 2023-12-28 | Payer: MEDICARE

## 2023-12-28 ENCOUNTER — Ambulatory Visit: Admit: 2023-12-28 | Discharge: 2023-12-28 | Payer: MEDICARE

## 2024-01-03 ENCOUNTER — Ambulatory Visit: Admit: 2024-01-03 | Discharge: 2024-01-04 | Payer: MEDICARE

## 2024-01-03 DIAGNOSIS — H6120 Impacted cerumen, unspecified ear: Principal | ICD-10-CM

## 2024-01-03 DIAGNOSIS — R0981 Nasal congestion: Principal | ICD-10-CM

## 2024-01-03 DIAGNOSIS — H9190 Unspecified hearing loss, unspecified ear: Principal | ICD-10-CM

## 2024-01-03 DIAGNOSIS — H6123 Impacted cerumen, bilateral: Principal | ICD-10-CM

## 2024-01-03 MED ORDER — FLUTICASONE PROPIONATE 50 MCG/ACTUATION NASAL SPRAY,SUSPENSION
Freq: Every day | NASAL | 3 refills | 120.00 days | Status: CP
Start: 2024-01-03 — End: ?

## 2024-01-05 DIAGNOSIS — C801 Malignant (primary) neoplasm, unspecified: Principal | ICD-10-CM

## 2024-01-10 DIAGNOSIS — C801 Malignant (primary) neoplasm, unspecified: Principal | ICD-10-CM

## 2024-01-16 DIAGNOSIS — R899 Unspecified abnormal finding in specimens from other organs, systems and tissues: Principal | ICD-10-CM

## 2024-01-16 DIAGNOSIS — D8989 Other specified disorders involving the immune mechanism, not elsewhere classified: Principal | ICD-10-CM

## 2024-01-16 DIAGNOSIS — C801 Malignant (primary) neoplasm, unspecified: Principal | ICD-10-CM

## 2024-01-16 DIAGNOSIS — C549 Malignant neoplasm of corpus uteri, unspecified: Principal | ICD-10-CM

## 2024-01-16 DIAGNOSIS — R799 Abnormal finding of blood chemistry, unspecified: Principal | ICD-10-CM

## 2024-01-19 ENCOUNTER — Ambulatory Visit: Admit: 2024-01-19 | Discharge: 2024-01-20 | Payer: MEDICARE

## 2024-01-19 ENCOUNTER — Inpatient Hospital Stay: Admit: 2024-01-19 | Discharge: 2024-01-20 | Payer: MEDICARE

## 2024-01-19 ENCOUNTER — Other Ambulatory Visit: Admit: 2024-01-19 | Discharge: 2024-01-20 | Payer: MEDICARE

## 2024-01-19 DIAGNOSIS — C549 Malignant neoplasm of corpus uteri, unspecified: Principal | ICD-10-CM

## 2024-01-19 DIAGNOSIS — D8989 Other specified disorders involving the immune mechanism, not elsewhere classified: Principal | ICD-10-CM

## 2024-01-19 DIAGNOSIS — C801 Malignant (primary) neoplasm, unspecified: Principal | ICD-10-CM

## 2024-01-19 DIAGNOSIS — R799 Abnormal finding of blood chemistry, unspecified: Principal | ICD-10-CM

## 2024-01-19 DIAGNOSIS — R899 Unspecified abnormal finding in specimens from other organs, systems and tissues: Principal | ICD-10-CM

## 2024-01-21 DIAGNOSIS — D8989 Other specified disorders involving the immune mechanism, not elsewhere classified: Principal | ICD-10-CM

## 2024-01-21 DIAGNOSIS — N179 Acute kidney failure, unspecified: Principal | ICD-10-CM

## 2024-01-21 DIAGNOSIS — C801 Malignant (primary) neoplasm, unspecified: Principal | ICD-10-CM

## 2024-01-21 DIAGNOSIS — R799 Abnormal finding of blood chemistry, unspecified: Principal | ICD-10-CM

## 2024-01-21 DIAGNOSIS — R899 Unspecified abnormal finding in specimens from other organs, systems and tissues: Principal | ICD-10-CM

## 2024-01-21 DIAGNOSIS — C549 Malignant neoplasm of corpus uteri, unspecified: Principal | ICD-10-CM

## 2024-01-23 ENCOUNTER — Encounter: Admit: 2024-01-23 | Discharge: 2024-01-24 | Payer: MEDICARE

## 2024-01-23 DIAGNOSIS — C801 Malignant (primary) neoplasm, unspecified: Principal | ICD-10-CM

## 2024-01-23 DIAGNOSIS — N179 Acute kidney failure, unspecified: Principal | ICD-10-CM

## 2024-01-24 DIAGNOSIS — C801 Malignant (primary) neoplasm, unspecified: Principal | ICD-10-CM

## 2024-01-30 ENCOUNTER — Inpatient Hospital Stay: Admit: 2024-01-30 | Discharge: 2024-01-31 | Payer: MEDICARE

## 2024-01-30 ENCOUNTER — Encounter: Admit: 2024-01-30 | Discharge: 2024-01-31 | Payer: MEDICARE

## 2024-01-30 DIAGNOSIS — C801 Malignant (primary) neoplasm, unspecified: Principal | ICD-10-CM

## 2024-01-30 DIAGNOSIS — N179 Acute kidney failure, unspecified: Principal | ICD-10-CM

## 2024-02-07 DIAGNOSIS — C801 Malignant (primary) neoplasm, unspecified: Principal | ICD-10-CM

## 2024-02-09 ENCOUNTER — Inpatient Hospital Stay: Admit: 2024-02-09 | Discharge: 2024-02-10 | Payer: MEDICARE

## 2024-02-09 ENCOUNTER — Ambulatory Visit: Admit: 2024-02-09 | Discharge: 2024-02-10 | Payer: MEDICARE

## 2024-02-09 ENCOUNTER — Other Ambulatory Visit: Admit: 2024-02-09 | Discharge: 2024-02-10 | Payer: MEDICARE

## 2024-02-09 DIAGNOSIS — R899 Unspecified abnormal finding in specimens from other organs, systems and tissues: Principal | ICD-10-CM

## 2024-02-09 DIAGNOSIS — C801 Malignant (primary) neoplasm, unspecified: Principal | ICD-10-CM

## 2024-02-09 DIAGNOSIS — C549 Malignant neoplasm of corpus uteri, unspecified: Principal | ICD-10-CM

## 2024-02-09 DIAGNOSIS — D8989 Other specified disorders involving the immune mechanism, not elsewhere classified: Principal | ICD-10-CM

## 2024-02-09 DIAGNOSIS — R799 Abnormal finding of blood chemistry, unspecified: Principal | ICD-10-CM

## 2024-02-09 MED ORDER — APIXABAN 5 MG TABLET
ORAL_TABLET | Freq: Two times a day (BID) | ORAL | 5 refills | 30.00 days | Status: CP
Start: 2024-02-09 — End: ?

## 2024-02-09 MED ORDER — ONDANSETRON 4 MG DISINTEGRATING TABLET
ORAL_TABLET | Freq: Three times a day (TID) | ORAL | 2 refills | 20.00 days | Status: CP | PRN
Start: 2024-02-09 — End: ?

## 2024-02-09 MED ORDER — DEXAMETHASONE 4 MG TABLET
ORAL_TABLET | Freq: Every day | ORAL | 0 refills | 12.00 days | Status: CP
Start: 2024-02-09 — End: ?

## 2024-02-10 DIAGNOSIS — R899 Unspecified abnormal finding in specimens from other organs, systems and tissues: Principal | ICD-10-CM

## 2024-02-10 DIAGNOSIS — C801 Malignant (primary) neoplasm, unspecified: Principal | ICD-10-CM

## 2024-02-10 DIAGNOSIS — D8989 Other specified disorders involving the immune mechanism, not elsewhere classified: Principal | ICD-10-CM

## 2024-02-10 DIAGNOSIS — C549 Malignant neoplasm of corpus uteri, unspecified: Principal | ICD-10-CM

## 2024-02-10 DIAGNOSIS — R799 Abnormal finding of blood chemistry, unspecified: Principal | ICD-10-CM

## 2024-02-12 DIAGNOSIS — R899 Unspecified abnormal finding in specimens from other organs, systems and tissues: Principal | ICD-10-CM

## 2024-02-12 DIAGNOSIS — C801 Malignant (primary) neoplasm, unspecified: Principal | ICD-10-CM

## 2024-02-12 DIAGNOSIS — D8989 Other specified disorders involving the immune mechanism, not elsewhere classified: Principal | ICD-10-CM

## 2024-02-12 DIAGNOSIS — C549 Malignant neoplasm of corpus uteri, unspecified: Principal | ICD-10-CM

## 2024-02-12 DIAGNOSIS — R799 Abnormal finding of blood chemistry, unspecified: Principal | ICD-10-CM

## 2024-02-13 ENCOUNTER — Encounter: Admit: 2024-02-13 | Discharge: 2024-02-14 | Payer: MEDICARE

## 2024-02-13 DIAGNOSIS — N179 Acute kidney failure, unspecified: Principal | ICD-10-CM

## 2024-02-13 DIAGNOSIS — C801 Malignant (primary) neoplasm, unspecified: Principal | ICD-10-CM

## 2024-02-15 DIAGNOSIS — C801 Malignant (primary) neoplasm, unspecified: Principal | ICD-10-CM

## 2024-02-17 DIAGNOSIS — C801 Malignant (primary) neoplasm, unspecified: Principal | ICD-10-CM

## 2024-02-18 DIAGNOSIS — C801 Malignant (primary) neoplasm, unspecified: Principal | ICD-10-CM

## 2024-02-20 ENCOUNTER — Encounter: Admit: 2024-02-20 | Discharge: 2024-02-21

## 2024-02-20 DIAGNOSIS — C801 Malignant (primary) neoplasm, unspecified: Principal | ICD-10-CM

## 2024-02-20 DIAGNOSIS — N179 Acute kidney failure, unspecified: Principal | ICD-10-CM

## 2024-02-20 DIAGNOSIS — R197 Diarrhea, unspecified: Principal | ICD-10-CM

## 2024-02-22 ENCOUNTER — Ambulatory Visit: Admit: 2024-02-22 | Discharge: 2024-02-23

## 2024-02-22 ENCOUNTER — Encounter: Admit: 2024-02-22 | Discharge: 2024-02-23

## 2024-02-22 DIAGNOSIS — C801 Malignant (primary) neoplasm, unspecified: Principal | ICD-10-CM

## 2024-02-22 DIAGNOSIS — Z515 Encounter for palliative care: Principal | ICD-10-CM

## 2024-02-23 DIAGNOSIS — C801 Malignant (primary) neoplasm, unspecified: Principal | ICD-10-CM

## 2024-02-27 ENCOUNTER — Encounter: Admit: 2024-02-27 | Discharge: 2024-02-28

## 2024-02-27 DIAGNOSIS — C801 Malignant (primary) neoplasm, unspecified: Principal | ICD-10-CM

## 2024-02-27 DIAGNOSIS — N179 Acute kidney failure, unspecified: Principal | ICD-10-CM

## 2024-02-28 DIAGNOSIS — C801 Malignant (primary) neoplasm, unspecified: Principal | ICD-10-CM

## 2024-03-01 DIAGNOSIS — C801 Malignant (primary) neoplasm, unspecified: Principal | ICD-10-CM

## 2024-03-02 DIAGNOSIS — C801 Malignant (primary) neoplasm, unspecified: Principal | ICD-10-CM

## 2024-03-06 DIAGNOSIS — C801 Malignant (primary) neoplasm, unspecified: Principal | ICD-10-CM

## 2024-03-07 ENCOUNTER — Ambulatory Visit: Admit: 2024-03-07 | Discharge: 2024-03-08 | Payer: MEDICARE

## 2024-03-07 ENCOUNTER — Other Ambulatory Visit: Admit: 2024-03-07 | Discharge: 2024-03-08 | Payer: MEDICARE

## 2024-03-07 ENCOUNTER — Inpatient Hospital Stay: Admit: 2024-03-07 | Discharge: 2024-03-08 | Payer: MEDICARE

## 2024-03-07 DIAGNOSIS — C549 Malignant neoplasm of corpus uteri, unspecified: Principal | ICD-10-CM

## 2024-03-07 DIAGNOSIS — E559 Vitamin D deficiency, unspecified: Principal | ICD-10-CM

## 2024-03-07 DIAGNOSIS — D8989 Other specified disorders involving the immune mechanism, not elsewhere classified: Principal | ICD-10-CM

## 2024-03-07 DIAGNOSIS — D649 Anemia, unspecified: Principal | ICD-10-CM

## 2024-03-07 DIAGNOSIS — R899 Unspecified abnormal finding in specimens from other organs, systems and tissues: Principal | ICD-10-CM

## 2024-03-07 DIAGNOSIS — C801 Malignant (primary) neoplasm, unspecified: Principal | ICD-10-CM

## 2024-03-07 DIAGNOSIS — R799 Abnormal finding of blood chemistry, unspecified: Principal | ICD-10-CM

## 2024-03-07 DIAGNOSIS — Z9189 Other specified personal risk factors, not elsewhere classified: Principal | ICD-10-CM

## 2024-03-07 DIAGNOSIS — M87180 Osteonecrosis due to drugs, jaw: Principal | ICD-10-CM

## 2024-03-07 DIAGNOSIS — T148XXA Other injury of unspecified body region, initial encounter: Principal | ICD-10-CM

## 2024-03-07 MED ORDER — LIDOCAINE-PRILOCAINE 2.5 %-2.5 % TOPICAL CREAM
Freq: Every day | TOPICAL | 2 refills | 0.00 days | Status: CP | PRN
Start: 2024-03-07 — End: 2025-03-07

## 2024-03-07 MED ORDER — AMOXICILLIN 875 MG-POTASSIUM CLAVULANATE 125 MG TABLET
ORAL_TABLET | Freq: Two times a day (BID) | ORAL | 0 refills | 7.00 days | Status: CP
Start: 2024-03-07 — End: 2024-03-14

## 2024-03-07 MED ORDER — OXYCODONE 5 MG/5 ML ORAL SOLUTION
ORAL | 0 refills | 4.00 days | Status: CP | PRN
Start: 2024-03-07 — End: 2025-03-07

## 2024-03-08 DIAGNOSIS — R899 Unspecified abnormal finding in specimens from other organs, systems and tissues: Principal | ICD-10-CM

## 2024-03-08 DIAGNOSIS — R799 Abnormal finding of blood chemistry, unspecified: Principal | ICD-10-CM

## 2024-03-08 DIAGNOSIS — C801 Malignant (primary) neoplasm, unspecified: Principal | ICD-10-CM

## 2024-03-08 DIAGNOSIS — D8989 Other specified disorders involving the immune mechanism, not elsewhere classified: Principal | ICD-10-CM

## 2024-03-08 DIAGNOSIS — Z515 Encounter for palliative care: Principal | ICD-10-CM

## 2024-03-08 DIAGNOSIS — C549 Malignant neoplasm of corpus uteri, unspecified: Principal | ICD-10-CM

## 2024-03-09 DIAGNOSIS — R799 Abnormal finding of blood chemistry, unspecified: Principal | ICD-10-CM

## 2024-03-09 DIAGNOSIS — R899 Unspecified abnormal finding in specimens from other organs, systems and tissues: Principal | ICD-10-CM

## 2024-03-09 DIAGNOSIS — C549 Malignant neoplasm of corpus uteri, unspecified: Principal | ICD-10-CM

## 2024-03-09 DIAGNOSIS — D8989 Other specified disorders involving the immune mechanism, not elsewhere classified: Principal | ICD-10-CM

## 2024-03-09 DIAGNOSIS — C801 Malignant (primary) neoplasm, unspecified: Principal | ICD-10-CM

## 2024-03-12 ENCOUNTER — Ambulatory Visit: Admit: 2024-03-12 | Discharge: 2024-03-13 | Payer: MEDICARE

## 2024-03-12 ENCOUNTER — Encounter: Admit: 2024-03-12 | Discharge: 2024-03-13 | Payer: MEDICARE

## 2024-03-12 DIAGNOSIS — C801 Malignant (primary) neoplasm, unspecified: Principal | ICD-10-CM

## 2024-03-12 DIAGNOSIS — E60 Dietary zinc deficiency: Principal | ICD-10-CM

## 2024-03-12 DIAGNOSIS — N179 Acute kidney failure, unspecified: Principal | ICD-10-CM

## 2024-03-12 DIAGNOSIS — E559 Vitamin D deficiency, unspecified: Principal | ICD-10-CM

## 2024-03-12 DIAGNOSIS — T148XXA Other injury of unspecified body region, initial encounter: Principal | ICD-10-CM

## 2024-03-12 MED ORDER — ERGOCALCIFEROL (VITAMIN D2) 1,250 MCG (50,000 UNIT) CAPSULE
ORAL_CAPSULE | ORAL | 0 refills | 28.00 days | Status: CP
Start: 2024-03-12 — End: 2025-03-12

## 2024-03-12 MED ORDER — K-PHOS-NEUTRAL 250 MG TABLET
ORAL_TABLET | Freq: Every day | ORAL | 0 refills | 30.00 days | Status: CP
Start: 2024-03-12 — End: 2024-04-11

## 2024-03-12 MED ORDER — ZINC ACETATE 25 MG (ZINC) CAPSULE
ORAL_CAPSULE | Freq: Every day | ORAL | 0 refills | 0.00 days | Status: CP
Start: 2024-03-12 — End: ?

## 2024-03-14 DIAGNOSIS — C801 Malignant (primary) neoplasm, unspecified: Principal | ICD-10-CM

## 2024-03-19 ENCOUNTER — Encounter: Admit: 2024-03-19 | Discharge: 2024-03-20 | Payer: MEDICARE

## 2024-03-19 ENCOUNTER — Inpatient Hospital Stay: Admit: 2024-03-19 | Discharge: 2024-03-20 | Payer: MEDICARE

## 2024-03-19 DIAGNOSIS — C801 Malignant (primary) neoplasm, unspecified: Principal | ICD-10-CM

## 2024-03-19 DIAGNOSIS — N179 Acute kidney failure, unspecified: Principal | ICD-10-CM

## 2024-03-20 DIAGNOSIS — C801 Malignant (primary) neoplasm, unspecified: Principal | ICD-10-CM

## 2024-03-27 DIAGNOSIS — R799 Abnormal finding of blood chemistry, unspecified: Principal | ICD-10-CM

## 2024-03-27 DIAGNOSIS — C549 Malignant neoplasm of corpus uteri, unspecified: Principal | ICD-10-CM

## 2024-03-27 DIAGNOSIS — C801 Malignant (primary) neoplasm, unspecified: Principal | ICD-10-CM

## 2024-03-27 DIAGNOSIS — D8989 Other specified disorders involving the immune mechanism, not elsewhere classified: Principal | ICD-10-CM

## 2024-03-27 DIAGNOSIS — R899 Unspecified abnormal finding in specimens from other organs, systems and tissues: Principal | ICD-10-CM

## 2024-03-28 ENCOUNTER — Inpatient Hospital Stay: Admit: 2024-03-28 | Discharge: 2024-03-29 | Payer: MEDICARE

## 2024-03-28 ENCOUNTER — Other Ambulatory Visit: Admit: 2024-03-28 | Discharge: 2024-03-29 | Payer: MEDICARE

## 2024-03-28 ENCOUNTER — Ambulatory Visit: Admit: 2024-03-28 | Discharge: 2024-03-29 | Payer: MEDICARE

## 2024-03-28 DIAGNOSIS — C801 Malignant (primary) neoplasm, unspecified: Principal | ICD-10-CM

## 2024-03-28 DIAGNOSIS — R899 Unspecified abnormal finding in specimens from other organs, systems and tissues: Principal | ICD-10-CM

## 2024-03-28 DIAGNOSIS — C549 Malignant neoplasm of corpus uteri, unspecified: Principal | ICD-10-CM

## 2024-03-28 DIAGNOSIS — N179 Acute kidney failure, unspecified: Principal | ICD-10-CM

## 2024-03-28 DIAGNOSIS — R799 Abnormal finding of blood chemistry, unspecified: Principal | ICD-10-CM

## 2024-03-28 DIAGNOSIS — D8989 Other specified disorders involving the immune mechanism, not elsewhere classified: Principal | ICD-10-CM

## 2024-03-28 MED ORDER — DEXAMETHASONE 4 MG TABLET
ORAL_TABLET | Freq: Every day | ORAL | 0 refills | 12.00000 days | Status: CP
Start: 2024-03-28 — End: ?

## 2024-03-29 DIAGNOSIS — C801 Malignant (primary) neoplasm, unspecified: Principal | ICD-10-CM

## 2024-03-31 DIAGNOSIS — C801 Malignant (primary) neoplasm, unspecified: Principal | ICD-10-CM

## 2024-04-02 DIAGNOSIS — C801 Malignant (primary) neoplasm, unspecified: Principal | ICD-10-CM

## 2024-04-09 ENCOUNTER — Encounter: Admit: 2024-04-09 | Discharge: 2024-04-10 | Payer: MEDICARE

## 2024-04-09 DIAGNOSIS — C801 Malignant (primary) neoplasm, unspecified: Principal | ICD-10-CM

## 2024-04-09 DIAGNOSIS — N179 Acute kidney failure, unspecified: Principal | ICD-10-CM

## 2024-04-13 DIAGNOSIS — C801 Malignant (primary) neoplasm, unspecified: Principal | ICD-10-CM

## 2024-04-17 DIAGNOSIS — C801 Malignant (primary) neoplasm, unspecified: Principal | ICD-10-CM

## 2024-04-18 ENCOUNTER — Other Ambulatory Visit: Admit: 2024-04-18 | Discharge: 2024-04-19 | Payer: MEDICARE

## 2024-04-18 ENCOUNTER — Inpatient Hospital Stay: Admit: 2024-04-18 | Discharge: 2024-04-19 | Payer: MEDICARE

## 2024-04-18 ENCOUNTER — Ambulatory Visit: Admit: 2024-04-18 | Discharge: 2024-04-19 | Payer: MEDICARE

## 2024-04-18 DIAGNOSIS — E559 Vitamin D deficiency, unspecified: Principal | ICD-10-CM

## 2024-04-18 DIAGNOSIS — D8989 Other specified disorders involving the immune mechanism, not elsewhere classified: Principal | ICD-10-CM

## 2024-04-18 DIAGNOSIS — R799 Abnormal finding of blood chemistry, unspecified: Principal | ICD-10-CM

## 2024-04-18 DIAGNOSIS — C801 Malignant (primary) neoplasm, unspecified: Principal | ICD-10-CM

## 2024-04-18 DIAGNOSIS — M87180 Osteonecrosis due to drugs, jaw: Principal | ICD-10-CM

## 2024-04-18 DIAGNOSIS — C549 Malignant neoplasm of corpus uteri, unspecified: Principal | ICD-10-CM

## 2024-04-18 DIAGNOSIS — R899 Unspecified abnormal finding in specimens from other organs, systems and tissues: Principal | ICD-10-CM

## 2024-04-18 MED ORDER — OXYCODONE 5 MG/5 ML ORAL SOLUTION
ORAL | 0 refills | 4.00000 days | Status: CP | PRN
Start: 2024-04-18 — End: 2025-04-18

## 2024-04-18 MED ORDER — OLANZAPINE 2.5 MG TABLET
ORAL_TABLET | Freq: Every evening | ORAL | 0 refills | 30.00000 days | Status: CP
Start: 2024-04-18 — End: 2025-04-18

## 2024-04-19 DIAGNOSIS — R899 Unspecified abnormal finding in specimens from other organs, systems and tissues: Principal | ICD-10-CM

## 2024-04-19 DIAGNOSIS — R799 Abnormal finding of blood chemistry, unspecified: Principal | ICD-10-CM

## 2024-04-19 DIAGNOSIS — D8989 Other specified disorders involving the immune mechanism, not elsewhere classified: Principal | ICD-10-CM

## 2024-04-19 DIAGNOSIS — C801 Malignant (primary) neoplasm, unspecified: Principal | ICD-10-CM

## 2024-04-19 DIAGNOSIS — C549 Malignant neoplasm of corpus uteri, unspecified: Principal | ICD-10-CM

## 2024-04-19 MED ORDER — DULOXETINE 20 MG CAPSULE,DELAYED RELEASE
ORAL_CAPSULE | Freq: Every day | ORAL | 0 refills | 30.00000 days | Status: CP
Start: 2024-04-19 — End: 2024-05-19

## 2024-04-21 DIAGNOSIS — R899 Unspecified abnormal finding in specimens from other organs, systems and tissues: Principal | ICD-10-CM

## 2024-04-21 DIAGNOSIS — C801 Malignant (primary) neoplasm, unspecified: Principal | ICD-10-CM

## 2024-04-21 DIAGNOSIS — R799 Abnormal finding of blood chemistry, unspecified: Principal | ICD-10-CM

## 2024-04-21 DIAGNOSIS — C549 Malignant neoplasm of corpus uteri, unspecified: Principal | ICD-10-CM

## 2024-04-21 DIAGNOSIS — D8989 Other specified disorders involving the immune mechanism, not elsewhere classified: Principal | ICD-10-CM

## 2024-04-21 DIAGNOSIS — E559 Vitamin D deficiency, unspecified: Principal | ICD-10-CM

## 2024-04-21 MED ORDER — ERGOCALCIFEROL (VITAMIN D2) 1,250 MCG (50,000 UNIT) CAPSULE
ORAL_CAPSULE | ORAL | 0 refills | 28.00000 days | Status: CP
Start: 2024-04-21 — End: 2025-04-21

## 2024-04-23 ENCOUNTER — Encounter: Admit: 2024-04-23 | Discharge: 2024-04-24 | Payer: MEDICARE

## 2024-04-23 DIAGNOSIS — C801 Malignant (primary) neoplasm, unspecified: Principal | ICD-10-CM

## 2024-04-23 DIAGNOSIS — N179 Acute kidney failure, unspecified: Principal | ICD-10-CM

## 2024-04-25 ENCOUNTER — Other Ambulatory Visit: Payer: Self-pay | Admitting: Family Medicine

## 2024-04-25 DIAGNOSIS — Z1231 Encounter for screening mammogram for malignant neoplasm of breast: Secondary | ICD-10-CM

## 2024-04-26 DIAGNOSIS — M8710 Osteonecrosis due to drugs, unspecified bone: Principal | ICD-10-CM

## 2024-04-26 DIAGNOSIS — C801 Malignant (primary) neoplasm, unspecified: Principal | ICD-10-CM

## 2024-04-26 MED ORDER — AMOXICILLIN 400 MG/5 ML ORAL SUSPENSION
Freq: Three times a day (TID) | ORAL | 0 refills | 7.00000 days | Status: CP
Start: 2024-04-26 — End: 2024-05-03

## 2024-04-30 ENCOUNTER — Encounter: Admit: 2024-04-30 | Discharge: 2024-05-01 | Payer: MEDICARE

## 2024-04-30 DIAGNOSIS — N179 Acute kidney failure, unspecified: Principal | ICD-10-CM

## 2024-04-30 DIAGNOSIS — C801 Malignant (primary) neoplasm, unspecified: Principal | ICD-10-CM

## 2024-05-02 ENCOUNTER — Ambulatory Visit
Admit: 2024-05-02 | Discharge: 2024-05-03 | Payer: MEDICARE | Attending: Student in an Organized Health Care Education/Training Program | Primary: Student in an Organized Health Care Education/Training Program

## 2024-05-02 DIAGNOSIS — G893 Neoplasm related pain (acute) (chronic): Principal | ICD-10-CM

## 2024-05-02 DIAGNOSIS — T451X5A Adverse effect of antineoplastic and immunosuppressive drugs, initial encounter: Principal | ICD-10-CM

## 2024-05-02 DIAGNOSIS — G620 Drug-induced polyneuropathy: Principal | ICD-10-CM

## 2024-05-04 DIAGNOSIS — C801 Malignant (primary) neoplasm, unspecified: Principal | ICD-10-CM

## 2024-05-07 DIAGNOSIS — C801 Malignant (primary) neoplasm, unspecified: Principal | ICD-10-CM

## 2024-05-08 DIAGNOSIS — C801 Malignant (primary) neoplasm, unspecified: Principal | ICD-10-CM

## 2024-05-08 DIAGNOSIS — R799 Abnormal finding of blood chemistry, unspecified: Principal | ICD-10-CM

## 2024-05-08 DIAGNOSIS — C549 Malignant neoplasm of corpus uteri, unspecified: Principal | ICD-10-CM

## 2024-05-08 DIAGNOSIS — R899 Unspecified abnormal finding in specimens from other organs, systems and tissues: Principal | ICD-10-CM

## 2024-05-08 DIAGNOSIS — D8989 Other specified disorders involving the immune mechanism, not elsewhere classified: Principal | ICD-10-CM

## 2024-05-08 DIAGNOSIS — E559 Vitamin D deficiency, unspecified: Principal | ICD-10-CM

## 2024-05-09 ENCOUNTER — Ambulatory Visit: Admit: 2024-05-09 | Discharge: 2024-05-09 | Payer: MEDICARE

## 2024-05-09 ENCOUNTER — Inpatient Hospital Stay: Admit: 2024-05-09 | Discharge: 2024-05-09 | Payer: MEDICARE

## 2024-05-09 ENCOUNTER — Other Ambulatory Visit: Admit: 2024-05-09 | Discharge: 2024-05-09 | Payer: MEDICARE

## 2024-05-09 DIAGNOSIS — E559 Vitamin D deficiency, unspecified: Principal | ICD-10-CM

## 2024-05-09 DIAGNOSIS — R799 Abnormal finding of blood chemistry, unspecified: Principal | ICD-10-CM

## 2024-05-09 DIAGNOSIS — C549 Malignant neoplasm of corpus uteri, unspecified: Principal | ICD-10-CM

## 2024-05-09 DIAGNOSIS — C801 Malignant (primary) neoplasm, unspecified: Principal | ICD-10-CM

## 2024-05-09 DIAGNOSIS — D8989 Other specified disorders involving the immune mechanism, not elsewhere classified: Principal | ICD-10-CM

## 2024-05-09 DIAGNOSIS — R899 Unspecified abnormal finding in specimens from other organs, systems and tissues: Principal | ICD-10-CM

## 2024-05-09 MED ORDER — DEXAMETHASONE 4 MG TABLET
ORAL_TABLET | Freq: Every day | ORAL | 0 refills | 6.00000 days | Status: CP
Start: 2024-05-09 — End: ?

## 2024-05-10 ENCOUNTER — Ambulatory Visit
Admission: RE | Admit: 2024-05-10 | Discharge: 2024-05-10 | Disposition: A | Discharge status: Home / Self Care | Payer: Self-pay | Source: Ambulatory Visit | Attending: Family Medicine | Admitting: Family Medicine

## 2024-05-10 DIAGNOSIS — Z1231 Encounter for screening mammogram for malignant neoplasm of breast: Secondary | ICD-10-CM | POA: Diagnosis present

## 2024-05-10 DIAGNOSIS — R799 Abnormal finding of blood chemistry, unspecified: Principal | ICD-10-CM

## 2024-05-10 DIAGNOSIS — R899 Unspecified abnormal finding in specimens from other organs, systems and tissues: Principal | ICD-10-CM

## 2024-05-10 DIAGNOSIS — C801 Malignant (primary) neoplasm, unspecified: Principal | ICD-10-CM

## 2024-05-10 DIAGNOSIS — C549 Malignant neoplasm of corpus uteri, unspecified: Principal | ICD-10-CM

## 2024-05-10 DIAGNOSIS — D8989 Other specified disorders involving the immune mechanism, not elsewhere classified: Principal | ICD-10-CM

## 2024-05-10 DIAGNOSIS — E559 Vitamin D deficiency, unspecified: Principal | ICD-10-CM

## 2024-05-12 DIAGNOSIS — D8989 Other specified disorders involving the immune mechanism, not elsewhere classified: Principal | ICD-10-CM

## 2024-05-12 DIAGNOSIS — R799 Abnormal finding of blood chemistry, unspecified: Principal | ICD-10-CM

## 2024-05-12 DIAGNOSIS — C549 Malignant neoplasm of corpus uteri, unspecified: Principal | ICD-10-CM

## 2024-05-12 DIAGNOSIS — C801 Malignant (primary) neoplasm, unspecified: Principal | ICD-10-CM

## 2024-05-12 DIAGNOSIS — E559 Vitamin D deficiency, unspecified: Principal | ICD-10-CM

## 2024-05-12 DIAGNOSIS — R899 Unspecified abnormal finding in specimens from other organs, systems and tissues: Principal | ICD-10-CM

## 2024-05-14 ENCOUNTER — Encounter: Admit: 2024-05-14 | Discharge: 2024-05-15 | Payer: MEDICARE

## 2024-05-14 DIAGNOSIS — N179 Acute kidney failure, unspecified: Principal | ICD-10-CM

## 2024-05-14 DIAGNOSIS — C801 Malignant (primary) neoplasm, unspecified: Principal | ICD-10-CM

## 2024-05-15 DIAGNOSIS — C801 Malignant (primary) neoplasm, unspecified: Principal | ICD-10-CM

## 2024-05-16 DIAGNOSIS — C801 Malignant (primary) neoplasm, unspecified: Principal | ICD-10-CM

## 2024-05-18 DIAGNOSIS — G620 Drug-induced polyneuropathy: Principal | ICD-10-CM

## 2024-05-18 DIAGNOSIS — T451X5A Adverse effect of antineoplastic and immunosuppressive drugs, initial encounter: Principal | ICD-10-CM

## 2024-05-18 MED ORDER — DULOXETINE 20 MG CAPSULE,DELAYED RELEASE
ORAL_CAPSULE | Freq: Every day | ORAL | 0 refills | 0.00000 days
Start: 2024-05-18 — End: ?

## 2024-05-21 ENCOUNTER — Encounter: Admit: 2024-05-21 | Discharge: 2024-05-22 | Payer: MEDICARE

## 2024-05-21 DIAGNOSIS — C801 Malignant (primary) neoplasm, unspecified: Principal | ICD-10-CM

## 2024-05-21 DIAGNOSIS — N179 Acute kidney failure, unspecified: Principal | ICD-10-CM

## 2024-05-22 DIAGNOSIS — C801 Malignant (primary) neoplasm, unspecified: Principal | ICD-10-CM

## 2024-05-22 DIAGNOSIS — N179 Acute kidney failure, unspecified: Principal | ICD-10-CM

## 2024-05-22 MED ORDER — DULOXETINE 20 MG CAPSULE,DELAYED RELEASE
ORAL_CAPSULE | Freq: Every day | ORAL | 6 refills | 30.00000 days | Status: CP
Start: 2024-05-22 — End: ?

## 2024-05-24 DIAGNOSIS — C801 Malignant (primary) neoplasm, unspecified: Principal | ICD-10-CM

## 2024-05-24 DIAGNOSIS — M8710 Osteonecrosis due to drugs, unspecified bone: Principal | ICD-10-CM

## 2024-05-24 MED ORDER — AMOXICILLIN 400 MG/5 ML ORAL SUSPENSION
Freq: Three times a day (TID) | ORAL | 0 refills | 7.00000 days | Status: CP
Start: 2024-05-24 — End: 2024-05-31

## 2024-05-24 MED ORDER — GABAPENTIN 300 MG CAPSULE
ORAL_CAPSULE | Freq: Every evening | ORAL | 3 refills | 90.00000 days | Status: CP
Start: 2024-05-24 — End: ?

## 2024-05-29 ENCOUNTER — Inpatient Hospital Stay: Admit: 2024-05-29 | Discharge: 2024-05-29 | Payer: MEDICARE

## 2024-05-29 DIAGNOSIS — C801 Malignant (primary) neoplasm, unspecified: Principal | ICD-10-CM

## 2024-05-30 ENCOUNTER — Inpatient Hospital Stay: Admit: 2024-05-30 | Discharge: 2024-05-30 | Payer: MEDICARE

## 2024-05-30 ENCOUNTER — Ambulatory Visit: Admit: 2024-05-30 | Discharge: 2024-05-30 | Payer: MEDICARE

## 2024-05-30 ENCOUNTER — Other Ambulatory Visit: Admit: 2024-05-30 | Discharge: 2024-05-30 | Payer: MEDICARE

## 2024-05-30 DIAGNOSIS — E559 Vitamin D deficiency, unspecified: Principal | ICD-10-CM

## 2024-05-30 DIAGNOSIS — R899 Unspecified abnormal finding in specimens from other organs, systems and tissues: Principal | ICD-10-CM

## 2024-05-30 DIAGNOSIS — C549 Malignant neoplasm of corpus uteri, unspecified: Principal | ICD-10-CM

## 2024-05-30 DIAGNOSIS — R799 Abnormal finding of blood chemistry, unspecified: Principal | ICD-10-CM

## 2024-05-30 DIAGNOSIS — C801 Malignant (primary) neoplasm, unspecified: Principal | ICD-10-CM

## 2024-05-30 DIAGNOSIS — D8989 Other specified disorders involving the immune mechanism, not elsewhere classified: Principal | ICD-10-CM

## 2024-05-30 MED ORDER — GABAPENTIN 300 MG CAPSULE
ORAL_CAPSULE | Freq: Every evening | ORAL | 3 refills | 90.00000 days | Status: CP
Start: 2024-05-30 — End: ?

## 2024-05-30 MED ORDER — OXYCODONE 5 MG/5 ML ORAL SOLUTION
ORAL | 0 refills | 7.00000 days | Status: CP | PRN
Start: 2024-05-30 — End: ?

## 2024-05-31 DIAGNOSIS — C801 Malignant (primary) neoplasm, unspecified: Principal | ICD-10-CM

## 2024-06-05 DIAGNOSIS — C801 Malignant (primary) neoplasm, unspecified: Principal | ICD-10-CM

## 2024-06-06 DIAGNOSIS — C801 Malignant (primary) neoplasm, unspecified: Principal | ICD-10-CM

## 2024-06-21 DIAGNOSIS — C801 Malignant (primary) neoplasm, unspecified: Principal | ICD-10-CM

## 2024-07-03 DIAGNOSIS — C801 Malignant (primary) neoplasm, unspecified: Principal | ICD-10-CM

## 2024-07-03 DIAGNOSIS — M8710 Osteonecrosis due to drugs, unspecified bone: Principal | ICD-10-CM

## 2024-07-03 MED ORDER — AMOXICILLIN 400 MG/5 ML ORAL SUSPENSION
Freq: Three times a day (TID) | ORAL | 0 refills | 7.00000 days | Status: CP
Start: 2024-07-03 — End: 2024-07-10

## 2024-07-09 DIAGNOSIS — C801 Malignant (primary) neoplasm, unspecified: Principal | ICD-10-CM

## 2024-07-29 DIAGNOSIS — C801 Malignant (primary) neoplasm, unspecified: Principal | ICD-10-CM

## 2024-08-02 DIAGNOSIS — C801 Malignant (primary) neoplasm, unspecified: Principal | ICD-10-CM

## 2024-08-03 DIAGNOSIS — C801 Malignant (primary) neoplasm, unspecified: Principal | ICD-10-CM

## 2024-08-03 DIAGNOSIS — M8710 Osteonecrosis due to drugs, unspecified bone: Principal | ICD-10-CM

## 2024-08-03 MED ORDER — AMOXICILLIN 400 MG/5 ML ORAL SUSPENSION
Freq: Three times a day (TID) | ORAL | 0 refills | 14.00000 days | Status: CP
Start: 2024-08-03 — End: 2024-08-17

## 2024-08-15 ENCOUNTER — Ambulatory Visit
Admit: 2024-08-15 | Discharge: 2024-08-16 | Payer: MEDICARE | Attending: Student in an Organized Health Care Education/Training Program | Primary: Student in an Organized Health Care Education/Training Program

## 2024-08-15 DIAGNOSIS — G893 Neoplasm related pain (acute) (chronic): Principal | ICD-10-CM

## 2024-08-15 DIAGNOSIS — G620 Drug-induced polyneuropathy: Principal | ICD-10-CM

## 2024-08-15 DIAGNOSIS — T451X5A Adverse effect of antineoplastic and immunosuppressive drugs, initial encounter: Principal | ICD-10-CM

## 2024-08-28 ENCOUNTER — Inpatient Hospital Stay: Admit: 2024-08-28 | Discharge: 2024-08-28 | Payer: MEDICARE

## 2024-08-28 DIAGNOSIS — C801 Malignant (primary) neoplasm, unspecified: Principal | ICD-10-CM

## 2024-08-29 ENCOUNTER — Ambulatory Visit: Admit: 2024-08-29 | Discharge: 2024-08-29 | Payer: MEDICARE

## 2024-08-29 ENCOUNTER — Encounter: Admit: 2024-08-29 | Discharge: 2024-08-29 | Payer: MEDICARE

## 2024-08-29 DIAGNOSIS — C801 Malignant (primary) neoplasm, unspecified: Principal | ICD-10-CM

## 2024-08-29 DIAGNOSIS — G620 Drug-induced polyneuropathy: Principal | ICD-10-CM

## 2024-08-29 DIAGNOSIS — T451X5A Adverse effect of antineoplastic and immunosuppressive drugs, initial encounter: Principal | ICD-10-CM

## 2024-08-29 DIAGNOSIS — G893 Neoplasm related pain (acute) (chronic): Principal | ICD-10-CM

## 2024-08-29 DIAGNOSIS — C549 Malignant neoplasm of corpus uteri, unspecified: Principal | ICD-10-CM

## 2024-08-29 DIAGNOSIS — Z515 Encounter for palliative care: Principal | ICD-10-CM

## 2024-08-29 DIAGNOSIS — R799 Abnormal finding of blood chemistry, unspecified: Principal | ICD-10-CM

## 2024-08-29 MED ORDER — DULOXETINE 20 MG CAPSULE,DELAYED RELEASE
ORAL_CAPSULE | Freq: Every day | ORAL | 11 refills | 30.00000 days | Status: CP
Start: 2024-08-29 — End: ?

## 2024-08-29 MED ORDER — GABAPENTIN 300 MG CAPSULE
ORAL_CAPSULE | Freq: Every evening | ORAL | 3 refills | 90.00000 days | Status: CP
Start: 2024-08-29 — End: ?

## 2024-08-29 MED ORDER — APIXABAN 5 MG TABLET
ORAL_TABLET | Freq: Two times a day (BID) | ORAL | 5 refills | 30.00000 days | Status: CP
Start: 2024-08-29 — End: ?

## 2024-08-30 DIAGNOSIS — C549 Malignant neoplasm of corpus uteri, unspecified: Principal | ICD-10-CM

## 2024-08-30 DIAGNOSIS — C801 Malignant (primary) neoplasm, unspecified: Principal | ICD-10-CM

## 2024-08-30 MED ORDER — EVEROLIMUS (ANTINEOPLASTIC) 10 MG TABLET
ORAL_TABLET | Freq: Every day | ORAL | 5 refills | 28.00000 days | Status: CP
Start: 2024-08-30 — End: ?
  Filled 2024-09-10: qty 28, 28d supply, fill #0

## 2024-08-30 MED ORDER — LETROZOLE 2.5 MG TABLET
ORAL_TABLET | Freq: Every day | ORAL | 2 refills | 30.00000 days | Status: CP
Start: 2024-08-30 — End: 2025-08-30

## 2024-08-31 DIAGNOSIS — C801 Malignant (primary) neoplasm, unspecified: Principal | ICD-10-CM

## 2024-08-31 DIAGNOSIS — C549 Malignant neoplasm of corpus uteri, unspecified: Principal | ICD-10-CM

## 2024-09-02 DIAGNOSIS — C801 Malignant (primary) neoplasm, unspecified: Principal | ICD-10-CM

## 2024-09-02 DIAGNOSIS — C549 Malignant neoplasm of corpus uteri, unspecified: Principal | ICD-10-CM

## 2024-09-05 DIAGNOSIS — C549 Malignant neoplasm of corpus uteri, unspecified: Principal | ICD-10-CM

## 2024-09-05 DIAGNOSIS — C801 Malignant (primary) neoplasm, unspecified: Principal | ICD-10-CM

## 2024-09-06 NOTE — Unmapped (Signed)
 [facility] Specialty Medication Onboarding    Specialty Medication: Everolimus 10MG  tablets   Prior Authorization: Approved   Financial Assistance: No - copay  <$25  Final Copay/Day Supply: $0.00 / 28 days    Insurance Restrictions: Yes - max 1 month supply     Notes to Pharmacist: none  Credit Card on File: no  Start Date on Rx:  08/30/24  Delivery Method (based on home address currently on file): Courier      The triage team has completed the benefits investigation and has determined that the patient is able to fill this medication at [facility]. Please contact the patient to complete the onboarding or follow up with the prescribing physician as needed.

## 2024-09-07 DIAGNOSIS — C801 Malignant (primary) neoplasm, unspecified: Principal | ICD-10-CM

## 2024-09-07 DIAGNOSIS — C549 Malignant neoplasm of corpus uteri, unspecified: Principal | ICD-10-CM

## 2024-09-07 NOTE — Unmapped (Signed)
 [facility]    Patient Onboarding/Medication Counseling      Robin Weber is a 73 y.o. female with neoplasm of corpus uteri who I am counseling today on initiation of therapy.  I am speaking to the patient.    Was a Nurse, learning disability used for this call? No    Verified patient's date of birth / HIPAA.    Specialty medication(s) to be sent: Hematology/Oncology: everolimus      Non-specialty medications/supplies to be sent: n/a      Medications not needed at this time: n/a         [employer] (Everolimus)    Medication & Administration     Dosage: 10mg  (1 tablets) by mouth once daily (about the same time every day) with a glass of water.    Administration: Take by mouth consistently with or without food.      Medication comes in a blister card.    Swallow the tablet whole and do not break, crush, or chew the tablet.      Adherence/Missed dose instructions:  If you miss a dose, and it has been less than 6 hours since your regular dose time, take it as soon as you remember.  If it has been more than 6 hours, skip the dose.  Do not take 2 doses at once to make up for a missed dose.  If you vomit after taking your dose, do not take another dose and take next dose at its normally scheduled time.      Goals of Therapy     Prevent disease progression    Side Effects & Monitoring Parameters   Nausea/vomiting  Diarrhea/constipation  Mouth sores  Rash  Nail changes  Sun precautions  Infection precautions (fever > 100.5, chills, sore throat)  Fatigue  Anemia  Bleeding precautions (excessive bruising, nose bleeds, gums bleeding)  Hyperglycemia  Cholesterol and triglyceride elevations  Pneumonitis (cough, shortness of breath, difficulty breathing)  Swelling (edema)  Agitation, anxiety, unable to sleep  Dry mouth or skin  Pain in arms/legs, joints or back pain, or muscle spasm    The following side effects should be reported to the provider:    Heartbeat that doesn't feel normal (heart feels like it's racing, skipping a beat or fluttering)  Decrease in urination, change in color of urine, blood in urine   Yellowing of skin or eyes, your urine appears dark or brown or pain in abdomen  Signs of allergic reaction (rash, hives, shortness of breath)  Unexplained cough, shortness of breath or trouble breathing  Mouth sores or irritation  Infection like symptoms- UTI, fever, chills, cough, sinus pain, shortness of breath  Unexplained bleeding or bruising  Increased blood sugar levels (increased thirst, hunger, or urinating; fruity breath)  Electrolyte problems (mood changes, confusion, muscle pain/weakness, seizures, not hungry, or very bad upset stomach or throwing up)  Redness/irritation on palms or soles (can use Urea cream 20%)    Monitoring Parameters:  A1c at baseline and as needed  Lipid panel at baseline and as needed  CBC with differential baseline and every 6 months for first year of therapy and then annually  Monitor Liver function tests and serum creatinine  Pregnancy test for females with reproductive potential prior to treatment      Contraindications, Warnings, & Precautions     Mouth sores- discussed use of baking soda/salt rinses and mouth rinses available through prescription (dexamethasone )  Females of reproductive potential should be advised to avoid pregnancy and  use highly effective birth control during treatment and for 8 weeks after the last dose  Female patients with female partners of reproductive potential should use effective contraception during treatment and for 4 weeks after last dose.  Breastfeeding is not recommended during therapy and for 2 weeks after the last dose      Drug/Food Interactions     Avoid grapefruit and grapefruit juice  Medication list reviewed in Epic. The patient was instructed to inform the care team before taking any new medications or supplements including over the counter medications, vitamins, and herbal supplements. No drug interactions identified.   Avoid live vaccines    Storage, Handling Precautions, & Disposal     This medication should be stored at room temperature and in a dry location. Keep out of reach of others including children and pets. Keep the medicine in the original container with a child-proof top (no pillboxes). Do not throw away or flush unused medication down the toilet or sink. This drug is considered hazardous and should be handled as little as possible.  If someone else helps with medication administration, they should wear gloves      Current Medications (including OTC/herbals), Comorbidities and Allergies     Current Medications[1]    Allergies[2]    Problem List[3]    Reviewed and up to date in Epic.    Medication list has been reviewed and updated in Epic: Yes    Allergies have been reviewed and updated in Epic: Yes    Appropriateness of Therapy     Acute infections noted within Epic:  No active infections  Patient reported infection: None    Is the medication and dose appropriate considering the patient???s diagnosis, treatment, and disease journey, comorbidities, medical history, current medications, allergies, therapeutic goals, self-administration ability, and access barriers? Yes    Prescription has been clinically reviewed: Yes    Baseline Quality of Life Assessment      How many days over the past month did your condition  keep you from your normal activities? For example, brushing your teeth or getting up in the morning. 0    Patient-Reported Symptoms Tracker for Cancer Patients on Oral Chemotherapy     Oral chemotherapy medication name(s): everolimus  Dose and frequency: 10mg  daily  Oral Chemotherapy Start Date: 09/10/2024  Baseline? Yes  Clinic(s) visited: Gynecologic  Were you able to reach the patient on a call today? Yes    Symptom Grouping Question Patient Response   Digestion and Eating Have you felt sick to your stomach? Denies    Had diarrhea? Denies    Constipated? Denies    Not wanting to eat? Denies    Comments      Sleep and Pain Felt very tired even after you rest? Denies    Pain due to cancer medication or cancer? Denies    Comments     Other Side Effects Numbness or tingling in hands and/or feet? Denies    Felt short of breath? Denies    Mouth or throat Sores? Denies    Rash? Denies    Palmar-plantar erythrodysesthesia syndrome?      Rash - acneiform?      Rash - maculo-papular?      How many days over the past month did your cancer medication or cancer keep you from your normal activities?  Write in number of days, 0-30:  0    Other side effects or things you would like to discuss?      Comments?  Adherence  In the last 30 days, on how many days did you miss at least one dose of any of your [drug name]? Write in number of days, 0-30:       What reasons are you having trouble taking your medication [pharmacist: check all that apply]? Specify chemotherapy cycle:        No problems identified    Comments:        Comments       Optional Symptom Tracking  New start - hematology ([facility]    Comments:      Specialty Pharmacist Interventions:     Question Patient Response    What actions did you take in response to the patient's PRO answers? No action required   Other (Specify):        Financial Information     Medication Assistance provided: Prior Authorization    Anticipated copay of $0 reviewed with patient. Verified delivery address.    Delivery Information     Scheduled delivery date: 09/10/24    Expected start date: 09/10/24      Medication will be delivered via Same Day Courier to the prescription address in [facility].  This shipment will not require a signature.      Explained the services we provide at [facility] and that each month we would call to set up refills.  Stressed importance of returning phone calls so that we could ensure they receive their medications in time each month.  Informed patient that we should be setting up refills 7-10 days prior to when they will run out of medication. A pharmacist will reach out to perform a clinical assessment periodically.  Informed patient that a welcome packet, containing information about our pharmacy and other support services, a Notice of Privacy Practices, and a drug information handout will be sent.      The patient or caregiver noted above participated in the development of this care plan and knows that they can request review of or adjustments to the care plan at any time.      Patient or caregiver verbalized understanding of the above information as well as how to contact the pharmacy at [phone] option 4 with any questions/concerns.  The pharmacy is open Monday through Friday 8:30am-4:30pm.  A pharmacist is available 24/7 via pager to answer any clinical questions they may have.    Patient Specific Needs     Does the patient have any physical, cognitive, or cultural barriers? No    Does the patient have adequate living arrangements? (i.e. the ability to store and take their medication appropriately) Yes    Did you identify any home environmental safety or security hazards? No    Patient prefers to have medications discussed with  Patient     Is the patient or caregiver able to read and understand education materials at a high school level or above? Yes    Patient's primary language is  English     Is the patient high risk? Yes, patient is taking oral chemotherapy. Appropriateness of therapy as been assessed    Does the patient have an additional or emergency contact listed in their chart? Yes    SOCIAL DETERMINANTS OF HEALTH     At the [facility], we have learned that life circumstances - like trouble affording food, housing, utilities, or transportation can affect the health of many of our patients.   That is why we wanted to  ask: are you currently experiencing any life circumstances that are negatively impacting your health and/or quality of life? No    Social Drivers of Health     Food Insecurity: No Food Insecurity (04/18/2024)    Hunger Vital Sign     Worried About Running Out of Food in the Last Year: Never true     Ran Out of Food in the Last Year: Never true   Tobacco Use: Low Risk  (04/18/2024)    Patient History     Smoking Tobacco Use: Never     Smokeless Tobacco Use: Never     Passive Exposure: Not on file   Transportation Needs: No Transportation Needs (04/18/2024)    PRAPARE - Transportation     Lack of Transportation (Medical): No     Lack of Transportation (Non-Medical): No   Alcohol Use: Not on file   Housing: Low Risk  (04/18/2024)    Housing     Within the past 12 months, have you ever stayed: outside, in a car, in a tent, in an overnight shelter, or temporarily in someone else's home (i.e. couch-surfing)?: No     Are you worried about losing your housing?: No   Physical Activity: Not on file   Utilities: Low Risk  (04/18/2024)    Utilities     Within the past 12 months, have you been unable to get utilities (heat, electricity) when it was really needed?: No   Stress: Not on file   Interpersonal Safety: Patient Unable To Answer (04/18/2024)    Interpersonal Safety     Unsafe Where You Currently Live: Patient unable to answer     Physically Hurt by Anyone: Patient unable to answer     Abused by Anyone: Patient unable to answer   Substance Use: Not on file (09/27/2023)   Intimate Partner Violence: Not on file   Social Connections: Not on file   Financial Resource Strain: Low Risk  (10/27/2021)    Overall Financial Resource Strain (CARDIA)     Difficulty of Paying Living Expenses: Not hard at all   Health Literacy: Not on file   Internet Connectivity: Not on file       Would you be willing to receive help with any of the needs that you have identified today? Not applicable       KATE MUNSTER, [facility] Specialty Pharmacist         [1]   Current Outpatient Medications   Medication Sig Dispense Refill    apixaban  (ELIQUIS ) 5 mg Tab Take 1 tablet (5 mg total) by mouth two (2) times a day. 60 tablet 5 chlorhexidine (PERIDEX) 0.12 % solution 15 mL by Mouth route two (2) times a day. 473 mL 2    DULoxetine  (CYMBALTA ) 20 MG capsule Take 1 capsule (20 mg total) by mouth daily. 30 capsule 11    ergocalciferol -1,250 mcg, 50,000 unit, (DRISDOL ) 1,250 mcg (50,000 unit) capsule Take 1 capsule (1,250 mcg total) by mouth once a week. 4 capsule 0    everolimus, antineoplastic, (AFINITOR) 10 mg tablet Take 1 tablet (10 mg total) by mouth daily. 28 tablet 5    gabapentin  (NEURONTIN ) 300 MG capsule Take 1 capsule (300 mg total) by mouth at bedtime. 90 capsule 3    latanoprost (XALATAN) 0.005 % ophthalmic solution Administer 1 drop to both eyes nightly.      letrozole (FEMARA) 2.5 mg tablet Take 1 tablet (2.5 mg total) by mouth daily. 30  tablet 2    lidocaine -prilocaine  (EMLA ) 2.5-2.5 % cream Apply topically daily as needed. Prior to port access 30 g 2    NON FORMULARY Nature Made Iron Gummy - two gummies once a day      NON FORMULARY Nature Made Vitamin B12 Gummy - two gummies once a day      ondansetron  (ZOFRAN -ODT) 4 MG disintegrating tablet Take 1 tablet (4 mg total) by mouth every eight (8) hours as needed for nausea. 60 tablet 2    oxyCODONE  (ROXICODONE ) 5 mg/5 mL solution Take 5 mL (5 mg total) by mouth every four (4) hours as needed for pain. 200 mL 0    polyethylene glycol (MIRALAX) 17 gram packet Take 17 g by mouth Two (2) times a day. (Patient taking differently: Take 17 g by mouth in the morning.)      potassium chloride  (KLOR-CON ) 20 mEq packet Take 1 packet (20 mEq total) by mouth daily. 30 packet 3    prochlorperazine  (COMPAZINE ) 10 MG tablet Take 1 tablet (10 mg total) by mouth every six (6) hours as needed (nausea or vomiting). 30 tablet 2    senna (SENOKOT) 8.6 mg tablet Take 2 tablets by mouth daily. 60 tablet 5    timolo/brimon/dorzo/latanop/PF (TIMOL-BRIMON-DORZO-LATANOP,PF,) 0.5 %-0.15 %- 2 %-0.005 % Drop Apply 1 drop to eye two (2) times a day.      vitamin E, dl,tocopheryl acet, (VITAMIN E-180 MG, 400 UNIT,) 180 mg (400 unit) cap capsule Take 1 capsule (180 mg total) by mouth daily. 90 capsule 11    zinc  acetate 25 mg (zinc ) cap Take 1 capsule by mouth in the morning. 60 capsule 0     No current facility-administered medications for this visit.   [2]   Allergies  Allergen Reactions    Banana Nausea Only    Meloxicam Nausea Only   [3]   Patient Active Problem List  Diagnosis    Essential hypertension    GERD (gastroesophageal reflux disease)    Lung nodule    Weight loss    Adenocarcinoma    (CMS-HCC)    Cancer related pain    Glaucoma    Osteoarthritis of cervical spine    Lytic bone lesions on xray    Leukopenia    Electrolyte abnormality    Anemia associated with chemotherapy    Chemotherapy-induced peripheral neuropathy (HHS-HCC)    Abnormal laboratory test result    Other specified disorders involving the immune mechanism, not elsewhere classified (HHS-HCC)    Malignant neoplasm of corpus uteri, unspecified (CMS-HCC)    Abnormal finding of blood chemistry, unspecified    AKI (acute kidney injury)    Nasal congestion    Bilateral impacted cerumen    Hearing loss    Vitamin D  deficiency

## 2024-09-07 NOTE — Telephone Encounter (Signed)
 Specialty Medication Follow-up    Robin Weber is a 73 y.o. female with recurrent endometrial cancer who I am seeing for follow up on their treatment with everolimus and letrozole.     Chemotherapy: Everolimus 10 mg PO daily + Letrozole 2.5 mg PO daily  Start date: 09/11/24    A/P:   1. Oral Chemotherapy: CBC w/diff and CMP reviewed. Grade 1 anemia which is baseline. Patient meets treatment parameters to start oral chemotherapy. Will assess toxicity in 1 week with labs after 2 weeks of therapy.   Start everolimus 10 mg PO daily  Start letrozole 2.5 mg PO daily  Obtain CBC w/diff and CMP in 2 weeks    I spent approximately 5 minutes in direct patient care.    Next follow up: In 1 week for toxicity assessment    Referring physician: Dr. Gretta Thersia Molt, PharmD, BCOP, CPP  Gynecologic Oncology Clinic Pharmacist  Pager: [phone]    S/O: [name] was contacted via telephone regarding oral chemotherapy initiation. She is scheduled to receive everolimus on 09/10/24. All questions were answered and administration instructions reviewed.     Medications reviewed and updated in EPIC? no    Missed doses: N/A (not yet started)    Labs (08/29/24)  WBC 5.6 x10*9/L   Hgb 10.8 g/dL   Plt 729 k89*0/O   ANC 4.0 x10*9/L   SCr 0.89 mg/dL   TBili 0.2 mg/dL   AST 25 U/L   ALT 15 U/L   Alk Phos 86 U/L     Gynecologic Oncology    Gynecologic Oncology Metrics:      Dose and Schedule: Everolimus 10 mg PO daily + Letrozole 2.5 mg PO daily     Chemotherapy Dose: Dose Documented     Chemotherapy Schedule: Schedule documented     NCI CTCAE: Anemia - Grade 1

## 2024-09-17 DIAGNOSIS — C549 Malignant neoplasm of corpus uteri, unspecified: Principal | ICD-10-CM

## 2024-09-17 DIAGNOSIS — M87180 Osteonecrosis due to drugs, jaw: Principal | ICD-10-CM

## 2024-09-17 DIAGNOSIS — C801 Malignant (primary) neoplasm, unspecified: Principal | ICD-10-CM

## 2024-09-17 MED ORDER — OXYCODONE 5 MG/5 ML ORAL SOLUTION
Freq: Four times a day (QID) | ORAL | 0 refills | 10.00000 days | Status: CP | PRN
Start: 2024-09-17 — End: ?

## 2024-09-17 NOTE — Telephone Encounter (Signed)
 Specialty Medication Follow-up    Robin Weber is a 73 y.o. female with recurrent endometrial cancer who I am seeing for follow up on their treatment with everolimus and letrozole.     Chemotherapy: Everolimus 10 mg PO daily + Letrozole 2.5 mg PO daily  Start date: 09/11/24    A/P:   1. Oral Chemotherapy: No new labs to review. Grade 2 pain which has worsened. No grade 3 toxicities therefore will continue at current dose intensity. Will obtain labs next week after 2 weeks on treatment.   Continue everolimus 10 mg PO daily  Continue letrozole 2.5 mg PO daily  Obtain CBC w/diff and CMP in 1 week    2. MRONJ pain: Grade 2 which has worsened and is not currently well-controlled. She has follow up with her dentist scheduled therefore will start oxycodone  for moderate pain and rely on acetaminophen for mild pain.   Start oxycodone  5 mg PO q6h PRN moderate pain  Continue acetaminophen 650 mg PO q6h PRN mild pain    I spent approximately 10 minutes in direct patient care.    Next follow up: In 1 week with lab results (09/26/24)    Referring physician: Dr. Gretta Thersia Molt, PharmD, BCOP, CPP  Gynecologic Oncology Clinic Pharmacist  Pager: [phone]    S/O: Robin Weber was contacted via telephone regarding oral chemotherapy toxicity assessment. She reports that she did experience some taste changes last week but these have resolved and she has been able to maintain good oral intake. She does report that she has to be very gentle when eating as the area in her jaw of MRONJ has become swollen and painful. She has follow up with dentist Wednesday. She reports that she is currently taking acetaminophen and ibuprofen to control pain but this only lasts for a few hours. She denies any new mouth sores.     Medications reviewed and updated in EPIC? no    Missed doses: N/A (not yet started)    Labs (no new labs)    Gynecologic Oncology    Gynecologic Oncology Metrics:      Dose and Schedule: Everolimus 10 mg PO daily + Letrozole 2.5 mg PO daily     Chemotherapy Dose: Dose Documented     Chemotherapy Schedule: Schedule documented     NCI CTCAE: Pain - Grade 2        Interventions: Additional agent prescribed for side effect management  Comments: Start oxycodone 

## 2024-09-24 DIAGNOSIS — C549 Malignant neoplasm of corpus uteri, unspecified: Principal | ICD-10-CM

## 2024-09-24 DIAGNOSIS — C801 Malignant (primary) neoplasm, unspecified: Principal | ICD-10-CM

## 2024-09-24 NOTE — Telephone Encounter (Signed)
 505Specialty Medication Follow-up    Robin Weber is a 73 y.o. female with recurrent endometrial cancer who I am seeing for follow up on their treatment with everolimus and letrozole.     Chemotherapy: Everolimus 10 mg PO daily + Letrozole 2.5 mg PO daily  Start date: 09/11/24    A/P:   1. Oral Chemotherapy: No new labs to review. Grade 2 pain which has worsened. No grade 3 toxicities but given level of pain and worsened MRONJ will hold everolimus for wound healing. Once dental wound is improved will consider restarting at a reduced dose. Will still repeat labs later this week for toxicity assessment.   HOLD everolimus 10 mg PO daily  Continue letrozole 2.5 mg PO daily  Obtain CBC w/diff and CMP in 1 week    2. MRONJ pain: Grade 2 which has continued to worsen and not currently controlled with oxycodone  5 mg. Given the lack of control with oxycodone  5 mg will increase dose to 10 mg during this period of time.   Increase oxycodone  5-10 mg PO q6h PRN moderate pain  Continue acetaminophen 650 mg PO q6h PRN mild pain    I spent approximately 10 minutes in direct patient care.    Next follow up: later this week with lab results (09/26/24)    Referring physician: Dr. Gretta Thersia Weber, PharmD, BCOP, CPP  Gynecologic Oncology Clinic Pharmacist  Pager: [phone]    S/O: Robin Weber was contacted via telephone regarding oral chemotherapy toxicity assessment. She reports that recently she has experienced a significant amount of gum bleeding, swelling, and pain. She has been unable to sleep due to her current level of pain. She reports that she has been seen by her local dentist who has prescribed an antibiotic. She has discontinued ibuprofen for pain management.     Medications reviewed and updated in EPIC? no    Missed doses: N/A (not yet started)    Labs (no new labs)    Gynecologic Oncology    Gynecologic Oncology Metrics:      Dose and Schedule: Everolimus 10 mg PO daily + Letrozole 2.5 mg PO daily     Chemotherapy Dose: Dose Documented     Chemotherapy Schedule: Schedule documented     NCI CTCAE: Pain - Grade 2        Interventions: Oral oncolytic held for toxicity  Comments: Worsening MRONJ

## 2024-09-25 DIAGNOSIS — C801 Malignant (primary) neoplasm, unspecified: Principal | ICD-10-CM

## 2024-09-25 DIAGNOSIS — M87180 Osteonecrosis due to drugs, jaw: Principal | ICD-10-CM

## 2024-09-25 DIAGNOSIS — C549 Malignant neoplasm of corpus uteri, unspecified: Principal | ICD-10-CM

## 2024-09-25 MED ORDER — OXYCODONE 5 MG/5 ML ORAL SOLUTION
Freq: Four times a day (QID) | ORAL | 0 refills | 7.00000 days | Status: CP | PRN
Start: 2024-09-25 — End: ?

## 2024-09-26 ENCOUNTER — Ambulatory Visit: Admit: 2024-09-26 | Discharge: 2024-09-26 | Payer: MEDICARE

## 2024-09-26 DIAGNOSIS — C801 Malignant (primary) neoplasm, unspecified: Principal | ICD-10-CM

## 2024-09-26 DIAGNOSIS — C549 Malignant neoplasm of corpus uteri, unspecified: Principal | ICD-10-CM

## 2024-09-26 LAB — COMPREHENSIVE METABOLIC PANEL
ALBUMIN: 3.6 g/dL (ref 3.4–5.0)
ALKALINE PHOSPHATASE: 91 U/L (ref 46–116)
ALT (SGPT): 17 U/L (ref 10–49)
ANION GAP: 18 mmol/L — ABNORMAL HIGH (ref 5–14)
AST (SGOT): 28 U/L (ref <=34)
BILIRUBIN TOTAL: 0.4 mg/dL (ref 0.3–1.2)
BLOOD UREA NITROGEN: 13 mg/dL (ref 9–23)
BUN / CREAT RATIO: 14
CALCIUM: 9.7 mg/dL (ref 8.7–10.4)
CHLORIDE: 106 mmol/L (ref 98–107)
CO2: 21.9 mmol/L (ref 20.0–31.0)
CREATININE: 0.92 mg/dL (ref 0.55–1.02)
EGFR CKD-EPI (2021) FEMALE: 67 mL/min/1.73m2 (ref >=60)
GLUCOSE RANDOM: 90 mg/dL (ref 70–179)
POTASSIUM: 3.9 mmol/L (ref 3.4–4.8)
PROTEIN TOTAL: 7.4 g/dL (ref 5.7–8.2)
SODIUM: 146 mmol/L — ABNORMAL HIGH (ref 135–145)

## 2024-09-26 LAB — CBC W/ AUTO DIFF
BASOPHILS ABSOLUTE COUNT: 0 10*9/L (ref 0.0–0.1)
BASOPHILS RELATIVE PERCENT: 0.7 %
EOSINOPHILS ABSOLUTE COUNT: 0.1 10*9/L (ref 0.0–0.5)
EOSINOPHILS RELATIVE PERCENT: 1.1 %
HEMATOCRIT: 37.3 % (ref 34.0–44.0)
HEMOGLOBIN: 12.4 g/dL (ref 11.3–14.9)
LYMPHOCYTES ABSOLUTE COUNT: 0.6 10*9/L — ABNORMAL LOW (ref 1.1–3.6)
LYMPHOCYTES RELATIVE PERCENT: 12.3 %
MEAN CORPUSCULAR HEMOGLOBIN CONC: 33.2 g/dL (ref 32.0–36.0)
MEAN CORPUSCULAR HEMOGLOBIN: 29 pg (ref 25.9–32.4)
MEAN CORPUSCULAR VOLUME: 87.4 fL (ref 77.6–95.7)
MEAN PLATELET VOLUME: 7.5 fL (ref 6.8–10.7)
MONOCYTES ABSOLUTE COUNT: 0.3 10*9/L (ref 0.3–0.8)
MONOCYTES RELATIVE PERCENT: 7.2 %
NEUTROPHILS ABSOLUTE COUNT: 3.8 10*9/L (ref 1.8–7.8)
NEUTROPHILS RELATIVE PERCENT: 78.7 %
NUCLEATED RED BLOOD CELLS: 0 /100{WBCs} (ref <=4)
PLATELET COUNT: 164 10*9/L (ref 150–450)
RED BLOOD CELL COUNT: 4.27 10*12/L (ref 3.95–5.13)
RED CELL DISTRIBUTION WIDTH: 15.1 % (ref 12.2–15.2)
WBC ADJUSTED: 4.8 10*9/L (ref 3.6–11.2)

## 2024-09-26 MED ADMIN — heparin, porcine (PF) 100 unit/mL injection 500 Units: 500 [IU] | INTRAVENOUS | @ 16:00:00 | Stop: 2024-09-26

## 2024-09-26 NOTE — Telephone Encounter (Signed)
 Specialty Medication Follow-up    Robin Weber is a 73 y.o. female with recurrent endometrial cancer who I am seeing for follow up on their treatment with everolimus and letrozole.     Chemotherapy: Everolimus 10 mg PO daily + Letrozole 2.5 mg PO daily  Start date: 09/11/24    A/P:   1. Oral Chemotherapy: CBC w/diff and CMP reviewed. Grade 2 MRONJ pain which has improved. No other grade 3 toxicities but given severity of MRONJ will continue to hold everolimus. Will reassess in 2 weeks for improved mouth sore.   HOLD everolimus 10 mg PO daily  Continue letrozole 2.5 mg PO daily  Obtain CBC w/diff and CMP in 1 week    2. MRONJ pain: Grade 2 which has improved with better pain control. While wound is still present bleeding has improved. Will reassess in 2 weeks for continued improvement and healing.   Continue oxycodone  5-10 mg PO q6h PRN moderate pain  Continue acetaminophen 650 mg PO q6h PRN mild pain    I spent approximately 10 minutes in direct patient care.    Next follow up: In 2 weeks for toxicity assessment    Referring physician: Dr. Gretta Thersia Molt, PharmD, BCOP, CPP  Gynecologic Oncology Clinic Pharmacist  Pager: [phone]    S/O: Ms. Robin Weber was contacted via telephone regarding recent lab results and oral chemotherapy toxicity assessment.  She reports that she has been able to increase her oxycodone  dose which resulted in better pain relief. Her gum bleeding has improved with holding ibuprofen.      Medications reviewed and updated in EPIC? no    Missed doses: N/A (on HOLD)    Labs  Clinical Support on 09/26/2024   Component Date Value Ref Range Status    Sodium 09/26/2024 146 (H)  135 - 145 mmol/L Final    Potassium 09/26/2024 3.9  3.4 - 4.8 mmol/L Final    Chloride 09/26/2024 106  98 - 107 mmol/L Final    CO2 09/26/2024 21.9  20.0 - 31.0 mmol/L Final    Anion Gap 09/26/2024 18 (H)  5 - 14 mmol/L Final    BUN 09/26/2024 13  9 - 23 mg/dL Final    Creatinine 88/94/7974 0.92  0.55 - 1.02 mg/dL Final BUN/Creatinine Ratio 09/26/2024 14   Final    eGFR CKD-EPI (2021) Female 09/26/2024 67  >=60 mL/min/1.26m2 Final    eGFR calculated with CKD-EPI 2021 equation in accordance with [employer] and [employer] of [employer]ph[employer] Task Force recommendations.    Glucose 09/26/2024 90  70 - 179 mg/dL Final    Calcium 88/94/7974 9.7  8.7 - 10.4 mg/dL Final    Albumin 88/94/7974 3.6  3.4 - 5.0 g/dL Final    Total Protein 09/26/2024 7.4  5.7 - 8.2 g/dL Final    Total Bilirubin 09/26/2024 0.4  0.3 - 1.2 mg/dL Final    AST 88/94/7974 28  <=34 U/L Final    ALT 09/26/2024 17  10 - 49 U/L Final    Alkaline Phosphatase 09/26/2024 91  46 - 116 U/L Final    WBC 09/26/2024 4.8  3.6 - 11.2 10*9/L Final    RBC 09/26/2024 4.27  3.95 - 5.13 10*12/L Final    HGB 09/26/2024 12.4  11.3 - 14.9 g/dL Final    HCT 88/94/7974 37.3  34.0 - 44.0 % Final    MCV 09/26/2024 87.4  77.6 - 95.7 fL Final    MCH 09/26/2024 29.0  25.9 -  32.4 pg Final    MCHC 09/26/2024 33.2  32.0 - 36.0 g/dL Final    RDW 88/94/7974 15.1  12.2 - 15.2 % Final    MPV 09/26/2024 7.5  6.8 - 10.7 fL Final    Platelet 09/26/2024 164  150 - 450 10*9/L Final    nRBC 09/26/2024 0  <=4 /100 WBCs Final    Neutrophils % 09/26/2024 78.7  % Final    Lymphocytes % 09/26/2024 12.3  % Final    Monocytes % 09/26/2024 7.2  % Final    Eosinophils % 09/26/2024 1.1  % Final    Basophils % 09/26/2024 0.7  % Final    Absolute Neutrophils 09/26/2024 3.8  1.8 - 7.8 10*9/L Final    Absolute Lymphocytes 09/26/2024 0.6 (L)  1.1 - 3.6 10*9/L Final    Absolute Monocytes 09/26/2024 0.3  0.3 - 0.8 10*9/L Final    Absolute Eosinophils 09/26/2024 0.1  0.0 - 0.5 10*9/L Final    Absolute Basophils 09/26/2024 0.0  0.0 - 0.1 10*9/L Final         Gynecologic Oncology    Gynecologic Oncology Metrics:      Dose and Schedule: Everolimus 10 mg PO daily + Letrozole 2.5 mg PO daily     Chemotherapy Dose: Dose Documented     Chemotherapy Schedule: Schedule documented     Comments: Grade 2 MRONJ pain Interventions: Oral oncolytic held for toxicity

## 2024-10-01 DIAGNOSIS — C549 Malignant neoplasm of corpus uteri, unspecified: Principal | ICD-10-CM

## 2024-10-01 DIAGNOSIS — C801 Malignant (primary) neoplasm, unspecified: Principal | ICD-10-CM

## 2024-10-02 DIAGNOSIS — C801 Malignant (primary) neoplasm, unspecified: Principal | ICD-10-CM

## 2024-10-02 DIAGNOSIS — C549 Malignant neoplasm of corpus uteri, unspecified: Principal | ICD-10-CM

## 2024-10-02 MED ORDER — EVEROLIMUS (ANTINEOPLASTIC) 5 MG TABLET
ORAL_TABLET | Freq: Every day | ORAL | 5 refills | 28.00000 days | Status: CP
Start: 2024-10-02 — End: ?

## 2024-10-10 DIAGNOSIS — C549 Malignant neoplasm of corpus uteri, unspecified: Principal | ICD-10-CM

## 2024-10-10 DIAGNOSIS — C801 Malignant (primary) neoplasm, unspecified: Principal | ICD-10-CM

## 2024-10-10 NOTE — Telephone Encounter (Signed)
 Specialty Medication Follow-up    Robin Weber is a 73 y.o. female with recurrent endometrial cancer who I am seeing for follow up on their treatment with everolimus  and letrozole .     Chemotherapy: Everolimus  10 mg PO daily + Letrozole  2.5 mg PO daily  Start date: 09/11/24    A/P:   1. Oral Chemotherapy: No new labs to review. Patient has elected to hold off on restarting treatment until after the holiday and next CT along with visit with Dr. Gretta. Will continue to hold and if patient elects to restart would restart at a reduced dose everolimus .   HOLD everolimus  10 mg PO daily  Continue letrozole  2.5 mg PO daily  Obtain CBC w/diff and CMP in 1 week    2. MRONJ pain: Grade 1 which has improved without the requirement for daily oxycodone . Patient does report continued pain though. Will continue to provide support.   Continue oxycodone  5-10 mg PO q6h PRN moderate pain  Continue acetaminophen 650 mg PO q6h PRN mild pain    I spent approximately 10 minutes in direct patient care.    Next follow up: In 3 weeks for toxicity assessment    Referring physician: Dr. Gretta Thersia Molt, PharmD, BCOP, CPP  Gynecologic Oncology Clinic Pharmacist  Pager: [phone]    S/O: Robin Weber was contacted via telephone as a follow up. She reports that her mouth continues to be painful despite holding everolimus . She reports that she hasn't required oxycodone  in 2-3 days so it has overall improved but still remains painful. She reports that she is completing her course of antibiotics today. She has her next dental appointment scheduled for December. She has discussed treatment options with her family and they have elected to take a treatment holiday until after Christmas.     Medications reviewed and updated in EPIC? no    Missed doses: N/A (on HOLD)    Labs (No new labs)

## 2024-10-15 DIAGNOSIS — C801 Malignant (primary) neoplasm, unspecified: Principal | ICD-10-CM

## 2024-10-15 DIAGNOSIS — C549 Malignant neoplasm of corpus uteri, unspecified: Principal | ICD-10-CM

## 2024-10-15 NOTE — Progress Notes (Signed)
 Robin Weber has been contacted in regards to their refill of everolimus . At this time, they have declined refill due to medication being on hold. Refill assessment call date has been updated per the patient's request.

## 2024-10-22 DIAGNOSIS — C801 Malignant (primary) neoplasm, unspecified: Principal | ICD-10-CM

## 2024-10-22 DIAGNOSIS — C549 Malignant neoplasm of corpus uteri, unspecified: Principal | ICD-10-CM

## 2024-10-25 DIAGNOSIS — M87180 Osteonecrosis due to drugs, jaw: Principal | ICD-10-CM

## 2024-10-25 DIAGNOSIS — C549 Malignant neoplasm of corpus uteri, unspecified: Principal | ICD-10-CM

## 2024-10-25 DIAGNOSIS — C801 Malignant (primary) neoplasm, unspecified: Principal | ICD-10-CM

## 2024-10-25 MED ORDER — OXYCODONE 5 MG/5 ML ORAL SOLUTION
Freq: Four times a day (QID) | ORAL | 0 refills | 14.00000 days | Status: CP | PRN
Start: 2024-10-25 — End: ?

## 2024-10-25 NOTE — Telephone Encounter (Signed)
 Telephone Call: Pain    A/P  1. MRONJ pain: Grade 2 which has worsened despite being off oral chemotherapy. Patient will restart oxycodone  to control pain. Additionally, antibiotics have helped to control pain in the past therefore will restart a course of amoxicillin  to make it to next dental appointment next week.   Restart oxycodone  5 mg PO q6h PRN pain  Restart amoxicillin  500 mg PO TID    S/O: Robin Weber is a 73 y.o. female with recurrent endometrial cancer currently not on therapy but continues to experience MRONJ resulting in significant pain. She reports that the sore in her mouth remains bothersome and painful. She is taking acetaminophen which somewhat helps with the pain but not to the same degree that oxycodone  helped. She is almost out of oxycodone  and would need a refill. She meets with the oral surgeon next week for further evaluation.    I spent approximately 10 minutes in patient care activities.    Referring physician: Dr. Gretta Thersia Molt, PharmD, BCOP, CPP  Gynecologic Oncology Clinic Pharmacist  Pager: [phone]

## 2024-10-26 MED ORDER — AMOXICILLIN 500 MG CAPSULE
ORAL_CAPSULE | Freq: Three times a day (TID) | ORAL | 0 refills | 7.00000 days | Status: CP
Start: 2024-10-26 — End: 2024-11-02

## 2024-10-28 DIAGNOSIS — C801 Malignant (primary) neoplasm, unspecified: Principal | ICD-10-CM

## 2024-10-28 DIAGNOSIS — C549 Malignant neoplasm of corpus uteri, unspecified: Principal | ICD-10-CM

## 2024-10-30 DIAGNOSIS — C549 Malignant neoplasm of corpus uteri, unspecified: Principal | ICD-10-CM

## 2024-10-30 DIAGNOSIS — C801 Malignant (primary) neoplasm, unspecified: Principal | ICD-10-CM

## 2024-10-30 NOTE — Telephone Encounter (Signed)
 Pt called and left message - I called her back and lvm to return my call

## 2024-11-02 ENCOUNTER — Encounter: Admit: 2024-11-02 | Discharge: 2024-11-02 | Payer: MEDICARE

## 2024-11-03 DIAGNOSIS — C549 Malignant neoplasm of corpus uteri, unspecified: Principal | ICD-10-CM

## 2024-11-03 DIAGNOSIS — C801 Malignant (primary) neoplasm, unspecified: Principal | ICD-10-CM

## 2024-11-06 DIAGNOSIS — C549 Malignant neoplasm of corpus uteri, unspecified: Principal | ICD-10-CM

## 2024-11-06 DIAGNOSIS — C801 Malignant (primary) neoplasm, unspecified: Principal | ICD-10-CM

## 2024-11-07 DIAGNOSIS — M87180 Osteonecrosis due to drugs, jaw: Principal | ICD-10-CM

## 2024-11-07 DIAGNOSIS — C801 Malignant (primary) neoplasm, unspecified: Principal | ICD-10-CM

## 2024-11-07 DIAGNOSIS — C549 Malignant neoplasm of corpus uteri, unspecified: Principal | ICD-10-CM

## 2024-11-07 MED ORDER — OXYCODONE 5 MG TABLET
ORAL_TABLET | Freq: Four times a day (QID) | ORAL | 0 refills | 8.00000 days | Status: CP | PRN
Start: 2024-11-07 — End: ?

## 2024-11-13 NOTE — Telephone Encounter (Signed)
 Called pt, lvm - scheduled return appt for jan 14 @ 830am per dr doc request

## 2024-11-13 NOTE — Patient Instructions (Incomplete)
 Today you had the Qutenza treatment for your Diabetic Neuropathy.  Please be aware of the following after your Qutenza treatment:  You may wash the treated area with mild soap and lukewarm water  Areas treated with Qutenza may be sensitive to heat, sunshine and hot water.  Please avoid hot showers/baths, direct sunlight and vigorous exercise for 3-4 days after treatment.  Remember to schedule your next Qutenza treatment, treatments are preformed every 3 months.  You may apply ice packs 20-30 minutes at a time to the treated areas if you experience pain.  MEDICATIONS   Please note that it is okay to continue other prescribed medications (blood pressure, insulin, water pill, depression/anxiety pill, etc.) as well as other prescribed pain medications such as Neurontin, Lyrical, Celebrex, Ultram, Vicodin, Norco and acetaminophen (Tylenol).     WHEN TO CALL THE DOCTOR/NURSE     The most side effects of QUTENZA are redness, pain, or itching where QUTENZA was applied. This will improve over the course of the week. You should tell your doctor if any side effects do not go away or become severe.    Call the Pain Management  Procedural nurses [phone],  during normal business hours (7 am-3 pm). If it is AFTER HOURS or during a weekend or holiday, call the hospital operator and ask for the Anesthesia Pain physician on call at [phone].   FOR EMERGENCIES, CALL 911 OR GO TO THE NEAREST HOSPITAL EMERGENCY DEPARTMENT.   ?   TO SCHEDULE APPOINTMENTS OR FOR QUESTIONS RELATED TO MEDICATIONS   Call the Pain Management Clinic at [phone]

## 2024-11-20 NOTE — Progress Notes (Unsigned)
 Capsaicin 8% Topical System Procedure Note  Attending: Vinie Campbell Daring  Assistant: None    Patient History:  73 y.o. 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 Direct procedure referral from Montie Jenkins Sor, FNP in oncology palliative care clinic.    Dates of Treatment:   05/02/24 - 75% relief for 3 months  08/15/24 - ***% relief for *** months      Area of pain: Bilateral feet in stocking/glove distribution     Pain is described as: Burning, tingling.     Treatments tried so far:   Gabapentin , Cymbalta , oxycodone     [phone]  (4 topical systems and post-application cleansing gel)-1120 units  [id number] discarded  CPT [id number]     Capsaicin 8% topical system application: 2 left foot, 2 right foot.      Description of procedure:  The risks and benefits of Qutenza treatment were discussed with the patient and serial consent was obtained on ***.  Discussed that unintended exposure to medication can cause severe irritation of the eyes, mucous membranes, respiratory tract, and skin. However, Qutenza is chosen for treatment because it is a local treatment and does not have systemic side effects of other medications.  Side effects may include pain, itching, erythema, and decreased sensory function.    4 patches of Qutenza were applied to the area of pain, 2 on the left foot 2 on the right foot. The patient's foot was then wrapped with Coban. The patient was monitored with the patches in place for 30 minutes.  After this each foot was on unwrapped, and postprocedure gel was applied liberally to each foot. This was applied for 1 minute and subsequently wiped off.     Patient tolerated the procedure well with no complaints of procedural pain. Vitals were obtained pre- and post-procedure.     Pre-procedure pain: ***2 this morning but is 5.5 at night***/10  Post-procedure pain: ***/10    Dispo:  Follow up for 4th treatment (ordered***).

## 2024-11-21 ENCOUNTER — Ambulatory Visit
Admit: 2024-11-21 | Discharge: 2024-11-22 | Payer: MEDICARE | Attending: Student in an Organized Health Care Education/Training Program | Primary: Student in an Organized Health Care Education/Training Program

## 2024-11-21 DIAGNOSIS — T451X5A Adverse effect of antineoplastic and immunosuppressive drugs, initial encounter: Principal | ICD-10-CM

## 2024-11-21 DIAGNOSIS — G893 Neoplasm related pain (acute) (chronic): Principal | ICD-10-CM

## 2024-11-21 DIAGNOSIS — G620 Drug-induced polyneuropathy: Principal | ICD-10-CM

## 2024-11-21 MED ADMIN — capsaicin-skin cleanser (QUTENZA) 8 % topical patch kit 4 patch: 4 | TOPICAL | @ 16:00:00 | Stop: 2024-11-21

## 2024-11-28 ENCOUNTER — Inpatient Hospital Stay: Admit: 2024-11-28 | Discharge: 2024-11-28 | Payer: MEDICARE

## 2024-11-28 DIAGNOSIS — C549 Malignant neoplasm of corpus uteri, unspecified: Principal | ICD-10-CM

## 2024-11-28 DIAGNOSIS — C801 Malignant (primary) neoplasm, unspecified: Principal | ICD-10-CM

## 2024-11-28 MED ADMIN — iohexol (OMNIPAQUE) 350 mg iodine/mL solution 100 mL: 100 mL | INTRAVENOUS | @ 15:00:00 | Stop: 2024-11-28

## 2024-12-03 DIAGNOSIS — C801 Malignant (primary) neoplasm, unspecified: Principal | ICD-10-CM

## 2024-12-03 DIAGNOSIS — C549 Malignant neoplasm of corpus uteri, unspecified: Principal | ICD-10-CM

## 2024-12-03 MED ORDER — BENZONATATE 100 MG CAPSULE
ORAL_CAPSULE | Freq: Three times a day (TID) | ORAL | 2 refills | 30.00000 days | Status: CP | PRN
Start: 2024-12-03 — End: ?

## 2024-12-04 NOTE — Progress Notes (Signed)
 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 Following cycle#1 of carbo/taxol on 11/18/21 she was admitted on 11/20/2021 for dysuria and left groin pain with the etiology due to a destructive osseous lytic lesion in her left inferior pubic ramus. Had palliative radiation to the bony lesion completed on 1/10. Received cycle#2 on 12/09/21 prior to discharge to SNF. Her course was complicated by urinary retention due to tumor burden with failed voiding trials.      Adenocarcinoma    (CMS-HCC)   11/04/2021 Initial Diagnosis    Adenocarcinoma (CMS-HCC)     11/18/2021 - 06/09/2022 Chemotherapy    OP GYN PACLITAXEL/CARBOPLATIN  PACLitaxel 175 mg/m2 IV on day 1, CARBOplatin AUC6 IV on day 1, every 21 days     07/02/2022 -  Chemotherapy    OP PEMBROLIZUMAB  200 MG Q3W  Pembrolizumab  200 mg IV on Day 1  21-day cycle     Malignant neoplasm of corpus uteri, unspecified (CMS-HCC)   06/29/2022 Initial Diagnosis    Malignant neoplasm of corpus uteri, unspecified (CMS-HCC)     07/02/2022 -  Chemotherapy    OP PEMBROLIZUMAB  200 MG Q3W  Pembrolizumab  200 mg IV on Day 1  21-day cycle     09/10/2022 Surgery    Procedure on 09/10/22:  Robotic-assisted total laparoscopic hysterectomy, left salpingo-oophorectomy, appendectomy      Operative Findings:  Bimanual exam without palpable adnexal masses, small mobile uterus. Normal appearing cervix without lesions. Upper abdominal survey normal. Mild intraperitoneal adhesive disease, including a thick adhesion of the transverse colon to the anterior abdominal wall and adhesion of the appendix to the right uterine cornua necessitating appendectomy. No evidence of residual intraperitoneal disease.      09/22/2022 -  Cancer Staged    Cancer Staging   No matching staging information was found for the patient. 09/22/2022 Tumor Board    FIGO Stage: IVB endometrial endometrioid adenocarcinoma, FIGO grade 2, extensive +LVSI, MMRi, p53 WT; s/p NACT followed by 3 cycles single-agent pembrolizumab  and interval palliative robotic hysterectomy.  Plan:   - Continue maintenance pembrolizumab .  - Obtain ER/PR staining.     Sources:   Eskander et al. (2023) Pembrolizumab  plus chemotherapy in advanced endometrial cancer (HB981). NEJM.      09/22/2022 -  Cancer Staged    Staging form: Corpus Uteri - Carcinoma and Carcinosarcoma, AJCC 8th Edition  - Clinical stage from 09/22/2022: FIGO Stage IVB - Signed by Yetta Marcey Dover, MD on 09/23/2022       12/07/2022 -  Other    Foundation1:  AKT1 (Pathogenic)  CTNNB1 (Pathogenic)      Chemotherapy    Finished carbo/taxol keytruda  (last platinum 05/2022) with progression at cycle #8 keytruda  maintenance in Jan 2024 so transitioned to Len/Pem in Feb 2024 with intolerance and transitioned to Doxil  in April 2024 with progression in July 2024 with transition to [facility] with tolerance issues but PET with significant disease improvement.          INTERVAL HISTORY:  Continues to clinically improve. Concerns about next steps and treatment. Cough has resumed. Jaw is healed.     PAST MEDICAL/SURGICAL HX:  Past Medical History[1]    Past Surgical History[2]    CURRENT MEDICATIONS:  Current Medications[3]    REVIEW OF SYSTEMS:  Complete 10-system review is negative except as  above in Interval History.    PHYSICAL EXAM:  BP 102/43  - Pulse 60  - Temp 36.4 ??C (97.5 ??F) (Temporal)  - Resp 16  - Ht 152.4 cm (5')  - Wt 60.7 kg (133 lb 14.4 oz)  - SpO2 100%  - BMI 26.15 kg/m??   General: Alert, oriented, no acute distress.  HEENT: Sclera anicteric.  Abdomen: Soft, nontender.   Extremities: Grossly normal range of motion.  Warm, well perfused.   Skin: No rashes or lesions noted.    LABORATORY AND RADIOLOGIC STUDIES:  CT Chest:   Impression   1.  Worsening pulmonary metastasis.         2.  Resolved previously seen groundglass opacity in the right lower lobe, consistent with an infectious or inflammatory process.     CT A/P:  Impression   1.  No evidence of new metastatic disease within the abdomen or pelvis.   2.  Unchanged appearance of the left pelvic osseous metastases.       ASSESSMENT AND PLAN:  MEKESHA SOLOMON is a 74 y.o. woman with Stage IVB uterine cancer (MMR intact, HER2 2+ but negative FISH, p53wt, presumed uterine origin based on histology) treated with 6 cycles of carbo/taxol followed by 3 additional cycles of carbo/taxol keytruda  (last platinum 05/2022) with progression at cycle #8 keytruda  maintenance in Jan 2024 so transitioned to Len/Pem in Feb 2024 with intolerance and transitioned to Doxil  in April 2024 with progression in July 2024 with transition to [facility] with tolerance issues but PET with significant disease improvement. Elected treatment holiday in July 2025. In October 2025 started letrozole /everolmius.     1) Uterine Cancer:   - ECOG1    - Treatment options:  resume Letrozole /Everolimus , retreatment with carbo/taxol, retreatment with Enhertu, single agent avastin, alternating megace /tamoxifen, CDK4/6 with letrozole  and best supportive care.  -Desires to resume dose reduced letrozole  and everolimus . Will repeat imaging in 3 months.        2) Cancer associated pain: Resolved. Palliative following.      3) Chemotherapy associated nausea: Did well with prophylactic steroids. No issues now.      4) ACP: Again discussed risk of pulmonary compromise with progression and discussed that intubation would likely be irreversible at the point at which this occurred.  Previously discussed recommendation for DNI if continued pulmonary progression resulted in respiratory compromise and she is considering.  Plans to make her  daughter HCPOA and they discussed short trial of intubation if reversible but focusing on her not suffering. Will continue to discuss CODE STATUS.      5) Bony mets: Previously on zometa . s/p palliative radiation. Having hip pain and xray on 6/21 showed only OA. Holding Zometa  given mandible issues.      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      7) Right hip arthritis: xray with OA.       8) Imaging: Now with progression. Scans due in 3 months to assess response with resumed letrozole /everolimus .     9) Cough: Starting back. PRN meds sent by CPP. CTM.      I have independently reviewed notes from prior visits, results from labs and imaging. The current plan of care requires intensive monitoring for toxicity due to use of anti-neoplastic treatments. Medical decision making was of high complexity due to ongoing risk of morbidity/mortality from chemotherapy treatments.     Sonny DEL. 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)     VTE (venous thromboembolism)    [2]   Past Surgical History:  Procedure Laterality Date    CESAREAN SECTION  1988    IR INSERT PORT AGE GREATER THAN 5 YRS  11/12/2021    IR INSERT PORT AGE GREATER THAN 5 YRS 11/12/2021 Jed Cap, MD IMG [facility]IR [facility]    PR APPENDECTOMY,W OTHR PROC N/A 09/10/2022    Procedure: APPENDECTOMY; WHEN DONE FOR INDICATED PURPOSE @ TIME OF OTHER MAJOR PROCEDURE;  Surgeon: Gretta Sonny Pedro, MD;  Location: MAIN OR [facility];  Service: Gynecology Oncology    PR BIOPSY OF VAGINA,SIMPLE N/A 10/27/2021    Procedure: BIOPSY OF VAGINAL MUCOSA; SIMPLE (SEPARATE PROCEDURE);  Surgeon: Harlene Almarie Seat, MD;  Location: [facility] OR [facility];  Service: Family Planning    PR LAPAROSCOPY W TOT HYSTERECTUTERUS <=250 VONNE ORN TUBE/OVARY Bilateral 09/10/2022    Procedure: ROBOTIC TOTAL HYSTERECTOMY WITH LEFT SALPINGOOPHORECTOMY, RIGHT TUBE AND OVARY ABSENT;  Surgeon: Gretta Sonny Pedro, MD;  Location: MAIN OR [facility];  Service: Gynecology Oncology   [3]   Current Outpatient Medications:     apixaban  (ELIQUIS ) 5 mg Tab, Take 1 tablet (5 mg total) by mouth two (2) times a day., Disp: 60 tablet, Rfl: 5    benzonatate  (TESSALON ) 100 MG capsule, Take 1 capsule (100 mg total) by mouth Three (3) times a day as needed for cough., Disp: 90 capsule, Rfl: 2    chlorhexidine (PERIDEX) 0.12 % solution, 15 mL by Mouth route two (2) times a day., Disp: 473 mL, Rfl: 2    DULoxetine  (CYMBALTA ) 20 MG capsule, Take 1 capsule (20 mg total) by mouth daily., Disp: 30 capsule, Rfl: 11    gabapentin  (NEURONTIN ) 300 MG capsule, Take 1 capsule (300 mg total) by mouth at bedtime., Disp: 90 capsule, Rfl: 3    latanoprost (XALATAN) 0.005 % ophthalmic solution, Administer 1 drop to both eyes nightly., Disp: , Rfl:     lidocaine -prilocaine  (EMLA ) 2.5-2.5 % cream, Apply topically daily as needed. Prior to port access, Disp: 30 g, Rfl: 2    NON FORMULARY, Nature Made Iron Gummy - two gummies once a day, Disp: , Rfl:     NON FORMULARY, Nature Made Vitamin B12 Gummy - two gummies once a day, Disp: , Rfl:     polyethylene glycol (MIRALAX) 17 gram packet, Take 17 g by mouth Two (2) times a day., Disp: , Rfl:     timolo/brimon/dorzo/latanop/PF (TIMOL-BRIMON-DORZO-LATANOP,PF,) 0.5 %-0.15 %- 2 %-0.005 % Drop, Apply 1 drop to eye two (2) times a day., Disp: , Rfl:     ALPRAZolam (XANAX) 0.5 MG tablet, Take 1 tablet (0.5 mg total) by mouth two (2) times a day as needed (Before oral surgery procedure. Take one 1 hour before and bring the other pill.) for up to 2 doses. (Patient not taking: Reported on 12/05/2024), Disp: 2 tablet, Rfl: 0    ergocalciferol -1,250 mcg, 50,000  unit, (DRISDOL ) 1,250 mcg (50,000 unit) capsule, Take 1 capsule (1,250 mcg total) by mouth once a week. (Patient not taking: Reported on 12/05/2024), Disp: 4 capsule, Rfl: 0    everolimus , antineoplastic, (AFINITOR ) 5 mg tablet, Take 1 tablet (5 mg total) by mouth daily. (Patient not taking: Reported on 12/05/2024), Disp: 28 tablet, Rfl: 5    letrozole  (FEMARA ) 2.5 mg tablet, Take 1 tablet (2.5 mg total) by mouth daily. (Patient not taking: Reported on 12/05/2024), Disp: 30 tablet, Rfl: 2    ondansetron  (ZOFRAN -ODT) 4 MG disintegrating tablet, Take 1 tablet (4 mg total) by mouth every eight (8) hours as needed for nausea. (Patient not taking: Reported on 12/05/2024), Disp: 60 tablet, Rfl: 2    oxyCODONE  (ROXICODONE ) 5 MG immediate release tablet, Take 1 tablet (5 mg total) by mouth every six (6) hours as needed for pain. (Patient not taking: Reported on 12/05/2024), Disp: 30 tablet, Rfl: 0    pentoxifylline (TRENTAL) 400 mg CR tablet, Take 1 tablet (400 mg total) by mouth in the morning and 1 tablet (400 mg total) in the evening. Take with meals. (Patient not taking: Reported on 12/05/2024), Disp: 180 tablet, Rfl: 0    potassium chloride  (KLOR-CON ) 20 mEq packet, Take 1 packet (20 mEq total) by mouth daily., Disp: 30 packet, Rfl: 3    prochlorperazine  (COMPAZINE ) 10 MG tablet, Take 1 tablet (10 mg total) by mouth every six (6) hours as needed (nausea or vomiting). (Patient not taking: Reported on 12/05/2024), Disp: 30 tablet, Rfl: 2    senna (SENOKOT) 8.6 mg tablet, Take 2 tablets by mouth daily. (Patient not taking: Reported on 12/05/2024), Disp: 60 tablet, Rfl: 5    vitamin E acetate (VITAMIN E-670 MG, 1000 UNIT,) 670 mg (1000 UNIT) capsule, Take 1 capsule (670 mg total) by mouth daily. (Patient not taking: Reported on 12/05/2024), Disp: 90 capsule, Rfl: 0    vitamin E, dl,tocopheryl acet, (VITAMIN E-180 MG, 400 UNIT,) 180 mg (400 unit) cap capsule, Take 1 capsule (180 mg total) by mouth daily. (Patient not taking: Reported on 12/05/2024), Disp: 90 capsule, Rfl: 11    zinc  acetate 25 mg (zinc ) cap, Take 1 capsule by mouth in the morning. (Patient not taking: Reported on 12/05/2024), Disp: 60 capsule, Rfl: 0

## 2024-12-05 ENCOUNTER — Ambulatory Visit
Admit: 2024-12-05 | Discharge: 2024-12-05 | Payer: MEDICARE | Attending: Gynecologic Oncology | Primary: Gynecologic Oncology

## 2024-12-05 ENCOUNTER — Ambulatory Visit: Admit: 2024-12-05 | Discharge: 2024-12-05 | Payer: MEDICARE

## 2024-12-05 DIAGNOSIS — C801 Malignant (primary) neoplasm, unspecified: Principal | ICD-10-CM

## 2024-12-05 DIAGNOSIS — C549 Malignant neoplasm of corpus uteri, unspecified: Principal | ICD-10-CM

## 2024-12-05 DIAGNOSIS — Z515 Encounter for palliative care: Principal | ICD-10-CM

## 2024-12-05 NOTE — Progress Notes (Signed)
 OUTPATIENT ONCOLOGY PALLIATIVE CARE NOTE        Principal Diagnosis: Robin Weber is a 74 y.o. 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  Disease sites include lung, bone, pelvis and lymph nodes.    Assessment/Plan:     Chemo-induced neuropathy-feet pain- relief from capsaicin . Interested in doing a taper  -Continue gabapentin  300 mg at nighttime. For now, do not recommend a taper and can consider this in the upcoming weeks. Shared the the gaba may be helping to support the sleep as well. Plan would be to decrease to 200 mg at bedtime x 1 week and then potentially decrease to 100 mg-if neuropathy pain is controlled.   -Continue Cymbalta  20 mg daily. (Note-higher doses produce dizziness)  -Continue pain clinic for applications of the qutenza /capsaicin  8%-first application was June 2025. Next dose in the April.     Cough-due to dx progression  -To start tessalon  as needed and to restart Letrozole /Everolimus .     Mood support-fair-Would like to involved in a support group for cancer patients in person is her preference.  She has tried to do this in [place] where she lives with [employer] [facility], the [facility] and also in [place] and this has not worked out.  It appears that these groups at times do not have any members.  Lives about 45 to 55 minutes away from [place].  Prefers not to do video if at all possible but that is an option.  - Will connect with my team for other possibilities.  Did offer to have individual counseling in person here at [facility].  Her preference is to do more group work.    Goals of care/acp  - Will be re-starting the therapy.  - Reviewed the benefits of having advance care planning documents done and provided the prepare for your care document today. Shared how the Prepare for your care document is a tool as well in helping to ensure that the end-of-life process is less stressful due to being aware of how patients envision the end of life to unfold.     -At prior visits:Acknowledging that there have been moments of difficulty.  Family is motivating and highly supportive.  -Discussed the prepare for your care document and benefits of having this done.  Does have a documented home.   Concerned about what how the future will unfold. Aware that the tumor marker has risen slightly.  Plan is to get a PET scan next week.  Thedora sharing she is also concerned about the welfare of her family.  -Continues to have a strong spiritual practice and good family support.    -At initial visit primary goal is to regain her independence.      Participants: Mr. & Mrs. Hosterman and daughter Jaquelyn and NP  Discussion/Action:   power of attorney.  Primary healthcare decision-maker is her daughter Sora Vrooman followed by her son Trella Thurmond and then her spouse Mr. Donia Yokum.    Health Care Decision Maker     HCDM (patient stated preference): Vung, Kush - Daughter - [phone]    HCDM (patient stated preference): Poyser,[name] - Spouse - [phone]    HCDM (patient stated preference): Gotts,james mickey GLENWOOD Blades - [phone]      #Controlled substances risk management.   - Patient does not have a signed pain medication agreement with our team.   - [place]CCSRS database was reviewed today and it was appropriate.   - Urine drug  screen was not performed at this visit. Findings: not applicable.   - Patient has received information about safe storage and administration of medications.   - Patient has not received a prescription for narcan ; is not applicable.       Current cancer-directed therapy: restarting the Letrozole /Everolimus .     F/u: 4/15    ----------------------------------------    Oncology Team: Dr. Gretta, Fredric Molt, CPP  PCP: Odell Chard, Edra Maiers, MD      HPI:  74 y.o. woman with Stage IVB GYN cancer (presumed uterine origin based on histology but primary site unclear).  Hospitalized from 11/20/2021 to 12/11/2021 for cancer related pain due to Left Groin Pain 2/2 Osseous Lytic Lesion of Pubic Bone.  Received radiation therapy and intensive rehab.  Reports that her rehab has really progressed significantly and now even able to walk without the rollator for a couple steps.  Fortunately, no more pain in the pelvis and left hip.  Primary symptom now is that neuropathic pain predominantly in bilateral toes.  Describes it as annoying and occurring 50% of the time.  Walking helps decrease the pain.  Hand neuropathy is not so much.  Has not taken any oxycodone  in the last 2 months.    Symptom Review: From initial assessment    Fatigue: Describes as okay.  Mobility: Using the rollator even in the home.  When comes to [facility] uses a wheelchair.  Remains active with home health PT and OT  Sleep: Could be better.  Feels that because she is not moving as much as she normally does that she is just not as tired when she goes to bed.  Appetite: Now it is better.  Nausea: Denies  Bowel function: Chemotherapy produces an altered bowel regimen fluctuating from constipation to diarrhea.  Lately its been daily with no issues.  Mood: Denies anxiety.  At times it is difficult to not have the lifestyle that she did have prior to the diagnosis.    -Had 09/10/22:Robotic-assisted total laparoscopic hysterectomy, left salpingo-oophorectomy, appendectomy       Interval hx 12/05/24 CK, Heron and Jaquelyn  Robin Weber is a 74 year old female with neuropathy who presents for follow-up with palliative care. Scans showed progression in lungs.    Followed by the oral sx team and she underwent surgery to remove the necrotic bone about a month ago. She reports no more oral pain. She had been off chemotherapy for over several months due to the wound healing issue but is now restarting everolimus  and letrozole .    She experiences peripheral feet neuropathy, which is being managed with Cymbalta  and Qutenza  treatments. The first Qutenza  treatment was tolerable, but the second was more painful. The burning sensation has decreased since the last treatment on December 31st, which was the third in the series. She is currently taking Cymbalta  and gabapentin  for neuropathy. Overall feels the qutenza  tx are helpful. Hoping to taper off some of the pain medications-gaba and the cymbalta .     She has dry skin, which she attributes to the cold weather, as she has been using the same soap for a long time. No itching or redness is associated with the dryness.    Her energy levels are variable, with some days being better than others. Sleep is not as restful as it used to be, partly due to reduced physical activity. She sometimes takes naps during the day. No oral pain, itching, or redness of the skin. She reports a little cough.  Palliative Performance Scale: 70% - Ambulation: Reduced / unable to do normal work, some evidence of disease / Self-Care: Full / Intake: Normal or reduced / Level of Conscious: Full      Social History:   Ms. Bordley lives with her husband Lynwood and her [name] lives close by and her son Lynwood lives in [place] .  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  Enjoys spending time with her family.  Likes football.    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 Following cycle#1 of carbo/taxol on 11/18/21 she was admitted on 11/20/2021 for dysuria and left groin pain with the etiology due to a destructive osseous lytic lesion in her left inferior pubic ramus. Had palliative radiation to the bony lesion completed on 1/10. Received cycle#2 on 12/09/21 prior to discharge to SNF. Her course was complicated by urinary retention due to tumor burden with failed voiding trials.      Adenocarcinoma    (CMS-HCC)   11/04/2021 Initial Diagnosis    Adenocarcinoma (CMS-HCC)     11/18/2021 - 06/09/2022 Chemotherapy    OP GYN PACLITAXEL/CARBOPLATIN  PACLitaxel 175 mg/m2 IV on day 1, CARBOplatin AUC6 IV on day 1, every 21 days     07/02/2022 -  Chemotherapy    OP PEMBROLIZUMAB  200 MG Q3W  Pembrolizumab  200 mg IV on Day 1  21-day cycle     Malignant neoplasm of corpus uteri, unspecified (CMS-HCC)   06/29/2022 Initial Diagnosis    Malignant neoplasm of corpus uteri, unspecified (CMS-HCC)     07/02/2022 -  Chemotherapy    OP PEMBROLIZUMAB  200 MG Q3W  Pembrolizumab  200 mg IV on Day 1  21-day cycle     09/10/2022 Surgery    Procedure on 09/10/22:  Robotic-assisted total laparoscopic hysterectomy, left salpingo-oophorectomy, appendectomy      Operative Findings:  Bimanual exam without palpable adnexal masses, small mobile uterus. Normal appearing cervix without lesions. Upper abdominal survey normal. Mild intraperitoneal adhesive disease, including a thick adhesion of the transverse colon to the anterior abdominal wall and adhesion of the appendix to the right uterine cornua necessitating appendectomy. No evidence of residual intraperitoneal disease.      09/22/2022 -  Cancer Staged    Cancer Staging   No matching staging information was found for the patient.       09/22/2022 Tumor Board    FIGO Stage: IVB endometrial endometrioid adenocarcinoma, FIGO grade 2, extensive +LVSI, MMRi, p53  WT; s/p NACT followed by 3 cycles single-agent pembrolizumab  and interval palliative robotic hysterectomy.  Plan:   - Continue maintenance pembrolizumab .  - Obtain ER/PR staining.     Sources: Eskander et al. (2023) Pembrolizumab  plus chemotherapy in advanced endometrial cancer (HB981). NEJM.      09/22/2022 -  Cancer Staged    Staging form: Corpus Uteri - Carcinoma and Carcinosarcoma, AJCC 8th Edition  - Clinical stage from 09/22/2022: FIGO Stage IVB - Signed by Yetta Marcey Dover, MD on 09/23/2022       12/07/2022 -  Other    Foundation1:  AKT1 (Pathogenic)  CTNNB1 (Pathogenic)      Chemotherapy    Finished carbo/taxol keytruda  (last platinum 05/2022) with progression at cycle #8 keytruda  maintenance in Jan 2024 so transitioned to Len/Pem in Feb 2024 with intolerance and transitioned to Doxil  in April 2024 with progression in July 2024 with transition to [facility] with tolerance issues but PET with significant disease improvement.          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  She indicated that her mother is deceased. She indicated that the status of her neg hx is unknown.      REVIEW OF SYSTEMS:  A comprehensive review of 10 systems was negative except for pertinent positives noted in HPI.    Vital signs for this encounter: VS reviewed in EPIC.  GEN: Awake and alert & in no acute distress  PSYCH: Alert and oriented to person, place and time. Euthymic.  HEENT: Pupils equally round without scleral icterus. No facial asymmetry.  LUNGS: No increased work of breathing.  SKIN: No rashes, petechiae or jaundice noted on visible skin  EXT: No edema noted of the lower extremities  NEURO: Nonfocal      Montie Sor, FNP  [facility] Outpatient Oncology Palliative Care      I personally spent 40 minutes face-to-face and non-face-to-face in the care of this patient, which includes all pre, intra, and post visit time on the date of service.  All documented time was specific to the E/M visit and does not include any procedures that may have been performed.

## 2024-12-05 NOTE — Progress Notes (Signed)
 The following steps were taken to reduce the risk of falls  - Patient using assistive device; remained within patients reach  - Patient oriented to environment (shown location of bathroom and exam table)  - Physical environment was clear of slip/trip hazards  - Footwear observed to ensure it was appropriate, if not, patient advised on importance of well fitted, closed toe, sturdy shoes with non-skid soles  - ???Preventing falls: Care instructions??? placed in patient AVS

## 2024-12-06 DIAGNOSIS — C549 Malignant neoplasm of corpus uteri, unspecified: Principal | ICD-10-CM

## 2024-12-06 DIAGNOSIS — C801 Malignant (primary) neoplasm, unspecified: Principal | ICD-10-CM

## 2024-12-07 DIAGNOSIS — C801 Malignant (primary) neoplasm, unspecified: Principal | ICD-10-CM

## 2024-12-07 DIAGNOSIS — C549 Malignant neoplasm of corpus uteri, unspecified: Principal | ICD-10-CM

## 2024-12-07 NOTE — Progress Notes (Signed)
 Clinical Assessment Needed For: Dose Change  Medication: everolimus  (antineoplastic) 5 mg tablet (AFINITOR )  Last Fill Date/Day Supply: 09/10/24 / 28 days  Copay $200.00  Was previous dose already scheduled to fill: No    Notes to Pharmacist: Per MAPs, no financial assistance available- $200 copay- $1497.80 expo card

## 2024-12-13 DIAGNOSIS — C549 Malignant neoplasm of corpus uteri, unspecified: Principal | ICD-10-CM

## 2024-12-13 DIAGNOSIS — C801 Malignant (primary) neoplasm, unspecified: Principal | ICD-10-CM

## 2024-12-13 MED ORDER — LETROZOLE 2.5 MG TABLET
ORAL_TABLET | Freq: Every day | ORAL | 2 refills | 30.00000 days | Status: CP
Start: 2024-12-13 — End: ?

## 2024-12-14 DIAGNOSIS — C801 Malignant (primary) neoplasm, unspecified: Principal | ICD-10-CM

## 2024-12-14 DIAGNOSIS — C549 Malignant neoplasm of corpus uteri, unspecified: Secondary | ICD-10-CM

## 2024-12-17 NOTE — Progress Notes (Unsigned)
 ***Incomplete clinical  - pt requested call back from [place] to discuss $200 copay -SA      [facility] Clinical Assessment & Refill Coordination Note    Robin Weber, DOB: October 06, 1951  Phone: [phone] (home)     All above HIPAA information was verified with patient.     Was a nurse, learning disability used for this call? No    Specialty Medication(s):   Hematology/Oncology: everolimus      Current Medications[1]     Changes to medications: Lucilia reports no changes at this time.    Medication list has been reviewed and updated in Epic: Yes    Allergies[2]    Changes to allergies: No    Allergies have been reviewed and updated in Epic: Yes    SPECIALTY MEDICATION ADHERENCE     everolimus  5 mg: 0 days of medicine on hand   everolimus  10 mg: unsure doses of medicine on hand - medication on hold and dose reduced to 5 mg    Specialty medication is an injection or given on a cycle: No    Medication Adherence    Patient reported X missed doses in the last month: 0  Specialty Medication: everolimus  5mg   Patient is on additional specialty medications: No  Informant: patient  Confirmed plan for next specialty medication refill: delivery by pharmacy  Refills needed for supportive medications: not needed          Specialty medication(s) dose(s) confirmed: everolimus  dose reduced to 5 mg once daily     Are there any concerns with adherence? NA - resuming at reduced dose    Adherence counseling provided? Not needed    CLINICAL MANAGEMENT AND INTERVENTION      Clinical Benefit Assessment:    Do you feel the medicine is effective or helping your condition? Yes    Clinical Benefit counseling provided? Not needed    Adverse Effects Assessment:    Are you experiencing any side effects? No    Are you experiencing difficulty administering your medicine? No    Quality of Life Assessment:    Quality of Life    Rheumatology  Oncology  1. What impact has your specialty medication had on the reduction of your daily pain or discomfort level?: None  2. On a scale of 1-10, how would you rate your ability to manage side effects associated with your specialty medication? 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 For example, brushing your teeth or getting up in the morning. 0    Have you discussed this with your provider? Not needed    Acute Infection Status:    Acute infections noted within Epic:  No active infections    Patient reported infection: None    Therapy Appropriateness:    Is the medication and dose appropriate considering the patient???s diagnosis, treatment, and disease journey, comorbidities, medical history, current medications, allergies, therapeutic goals, self-administration ability, and access barriers? Yes, therapy is appropriate and should be continued     Clinical Intervention:    Was an intervention completed as part of this clinical assessment? {INTERVENTIONADDITION:117563}    DISEASE/MEDICATION-SPECIFIC INFORMATION      N/A    {DISEASESTATESPECIFICASSESSMENT:98894}    PATIENT SPECIFIC NEEDS     Does the patient have any physical, cognitive,  or cultural barriers? No    Is the patient high risk? Yes, patient is taking oral chemotherapy. Appropriateness of therapy as been assessed    Does the patient require physician intervention or other additional services (i.e., nutrition, smoking cessation, social work)? {Blank single:19197::No,Yes, ***}    Does the patient have an additional or emergency contact listed in their chart? {Blank single:19197::Yes,No, patient refused.,***}    SOCIAL DETERMINANTS OF HEALTH     At the [facility], we have learned that life circumstances - like trouble affording food, housing, utilities, or transportation can affect the health of many of our patients.   That is why we wanted to ask: are you currently experiencing any life circumstances that are negatively impacting your health and/or quality of life? {YES/NO/PATIENTDECLINED:93004}    Social Drivers of Health     Food Insecurity: No Food Insecurity (04/18/2024)    Hunger Vital Sign     Worried About Running Out of Food in the Last Year: Never true     Ran Out of Food in the Last Year: Never true   Tobacco Use: Low Risk (12/05/2024)    Patient History     Smoking Tobacco Use: Never     Smokeless Tobacco Use: Never     Passive Exposure: Not on file   Transportation Needs: No Transportation Needs (04/18/2024)    PRAPARE - Transportation     Lack of Transportation (Medical): No     Lack of Transportation (Non-Medical): No   Alcohol Use: Not on file   Housing: Low Risk (04/18/2024)    Housing     Within the past 12 months, have you ever stayed: outside, in a car, in a tent, in an overnight shelter, or temporarily in someone else's home (i.e. couch-surfing)?: No     Are you worried about losing your housing?: No   Physical Activity: Not on file   Utilities: Low Risk (04/18/2024)    Utilities     Within the past 12 months, have you been unable to get utilities (heat, electricity) when it was really needed?: No   Stress: Not on file   Interpersonal Safety: Patient Unable To Answer (04/18/2024)    Interpersonal Safety     Unsafe Where You Currently Live: Patient unable to answer     Physically Hurt by Anyone: Patient unable to answer     Abused by Anyone: Patient unable to answer   Substance Use: Not on file (09/27/2023)   Intimate Partner Violence: Not on file   Social Connections: Not on file   Financial Resource Strain: Not on file   Health Literacy: Not on file   Internet Connectivity: Not on file       Would you be willing to receive help with any of the needs that you have identified today? {Yes/No/Not applicable:93005}       SHIPPING     Specialty Medication(s) to be Shipped:   Hematology/Oncology: everolimus     Other medication(s) to be shipped: {Blank:19197::***,No additional medications requested for fill at this time}    Specialty Medications not needed at this time: {specpharm2:122626}     Changes to insurance: {Blank:19197::Yes: ***,No}    Cost and Payment: {Blank single:19197::Patient has a $0 copay, payment information is not required.,Patient has a copay of $***. They are aware and have authorized the pharmacy to charge the credit card on file.,Patient has a copay of $***. The patient has Medicaid and has expressed that they are unable to provide payment at this time.  The patient will be sent a bill for them to pay a later date.,Unable to determine copay at this time as the prescription requires a prior authorization/financial assistance. Patient is aware that shipment will be held until copay has been approved and payment information collected, if needed.}    Delivery Scheduled: {Blank:19197::Yes, Expected medication delivery date: ***.,Yes, Expected medication delivery date: ***.  However, Rx request for refills was sent to the provider as there are none remaining.,Patient declined refill at this time due to ***.,No, cannot schedule delivery at this time as there are outstanding items that need addressed.  This note has been handed off to the provider for follow up.,Due to patient insurance changes, unable to fill at [facility], please route Rx to *** specialty pharmacy}     Medication will be delivered via {Blank:19197::UPS,Next Day Courier,Clinic Courier - *** clinic,***} to the confirmed {Blank:19197::prescription,temporary} address in [facility].    The patient will receive a drug information handout for each medication shipped and additional FDA Medication Guides as required.  Verified that patient has previously received a Conservation Officer, Historic Buildings and a Surveyor, Mining.    The patient or caregiver noted above participated in the development of this care plan and knows that they can request review of or adjustments to the care plan at any time.      All of the patient's questions and concerns have been addressed.    Robin Weber, [facility]   [place] [facility] Specialty Pharmacist       [1]   Current Outpatient Medications   Medication Sig Dispense Refill    apixaban  (ELIQUIS ) 5 mg Tab Take 1 tablet (5 mg total) by mouth two (2) times a day. 60 tablet 5    benzonatate  (TESSALON ) 100 MG capsule Take 1 capsule (100 mg total) by mouth Three (3) times a day as needed for cough. 90 capsule 2    chlorhexidine (PERIDEX) 0.12 % solution 15 mL by Mouth route two (2) times a day. 473 mL 2    DULoxetine  (CYMBALTA ) 20 MG capsule Take 1 capsule (20 mg total) by mouth daily. 30 capsule 11    gabapentin  (NEURONTIN ) 300 MG capsule Take 1 capsule (300 mg total) by mouth at bedtime. 90 capsule 3    latanoprost (XALATAN) 0.005 % ophthalmic solution Administer 1 drop to both eyes nightly.      lidocaine -prilocaine  (EMLA ) 2.5-2.5 % cream Apply topically daily as needed. Prior to port access 30 g 2    NON FORMULARY Nature Made Iron Gummy - two gummies once a day      NON FORMULARY Nature Made Vitamin B12 Gummy - two gummies once a day      polyethylene glycol (MIRALAX) 17 gram packet Take 17 g by mouth Two (2) times a day.      timolo/brimon/dorzo/latanop/PF (TIMOL-BRIMON-DORZO-LATANOP,PF,) 0.5 %-0.15 %- 2 %-0.005 % Drop Apply 1 drop to eye two (2) times a day.      ergocalciferol -1,250 mcg, 50,000 unit, (DRISDOL ) 1,250 mcg (50,000 unit) capsule Take 1 capsule (1,250 mcg total) by mouth once a week. (Patient not taking: Reported on 12/05/2024) 4 capsule 0    everolimus , antineoplastic, (AFINITOR ) 5 mg tablet Take 1 tablet (5 mg total) by mouth daily. (Patient not taking: Reported on 12/05/2024) 28 tablet 5    letrozole  (FEMARA ) 2.5 mg tablet TAKE 1 TABLET(2.5 MG) BY MOUTH DAILY 30 tablet 2    ondansetron  (ZOFRAN -ODT) 4 MG disintegrating tablet Take 1 tablet (  4 mg total) by mouth every eight (8) hours as needed for nausea. (Patient not taking: Reported on 12/05/2024) 60 tablet 2    pentoxifylline (TRENTAL) 400 mg CR tablet Take 1 tablet (400 mg total) by mouth in the morning and 1 tablet (400 mg total) in the evening. Take with meals. (Patient not taking: Reported on 12/05/2024) 180 tablet 0    potassium chloride  (KLOR-CON ) 20 mEq packet Take 1 packet (20 mEq total) by mouth daily. 30 packet 3    prochlorperazine  (COMPAZINE ) 10 MG tablet Take 1 tablet (10 mg total) by mouth every six (6) hours as needed (nausea or vomiting). (Patient not taking: Reported on 12/05/2024) 30 tablet 2    senna (SENOKOT) 8.6 mg tablet Take 2 tablets by mouth daily. (Patient not taking: Reported on 12/05/2024) 60 tablet 5    vitamin E acetate (VITAMIN E-670 MG, 1000 UNIT,) 670 mg (1000 UNIT) capsule Take 1 capsule (670 mg total) by mouth daily. (Patient not taking: Reported on 12/05/2024) 90 capsule 0    vitamin E, dl,tocopheryl acet, (VITAMIN E-180 MG, 400 UNIT,) 180 mg (400 unit) cap capsule Take 1 capsule (180 mg total) by mouth daily. (Patient not taking: Reported on 12/05/2024) 90 capsule 11    zinc  acetate 25 mg (zinc ) cap Take 1 capsule by mouth in the morning. (Patient not taking: Reported on 12/05/2024) 60 capsule 0     No current facility-administered medications for this visit.   [2]   Allergies  Allergen Reactions    Banana Nausea Only    Meloxicam Nausea Only

## 2024-12-18 DIAGNOSIS — C801 Malignant (primary) neoplasm, unspecified: Principal | ICD-10-CM

## 2024-12-18 DIAGNOSIS — C549 Malignant neoplasm of corpus uteri, unspecified: Secondary | ICD-10-CM

## 2024-12-19 ENCOUNTER — Ambulatory Visit: Admit: 2024-12-19 | Discharge: 2024-12-20 | Payer: MEDICARE

## 2024-12-19 DIAGNOSIS — C801 Malignant (primary) neoplasm, unspecified: Principal | ICD-10-CM

## 2024-12-19 DIAGNOSIS — C549 Malignant neoplasm of corpus uteri, unspecified: Secondary | ICD-10-CM

## 2024-12-19 LAB — CBC W/ AUTO DIFF
BASOPHILS ABSOLUTE COUNT: 0.1 10*9/L (ref 0.0–0.1)
BASOPHILS RELATIVE PERCENT: 0.7 %
EOSINOPHILS ABSOLUTE COUNT: 0.1 10*9/L (ref 0.0–0.5)
EOSINOPHILS RELATIVE PERCENT: 1.6 %
HEMATOCRIT: 31.7 % — ABNORMAL LOW (ref 34.0–44.0)
HEMOGLOBIN: 10.4 g/dL — ABNORMAL LOW (ref 11.3–14.9)
LYMPHOCYTES ABSOLUTE COUNT: 1 10*9/L — ABNORMAL LOW (ref 1.1–3.6)
LYMPHOCYTES RELATIVE PERCENT: 13.5 %
MEAN CORPUSCULAR HEMOGLOBIN CONC: 32.9 g/dL (ref 32.0–36.0)
MEAN CORPUSCULAR HEMOGLOBIN: 29.4 pg (ref 25.9–32.4)
MEAN CORPUSCULAR VOLUME: 89.3 fL (ref 77.6–95.7)
MEAN PLATELET VOLUME: 7.7 fL (ref 6.8–10.7)
MONOCYTES ABSOLUTE COUNT: 0.5 10*9/L (ref 0.3–0.8)
MONOCYTES RELATIVE PERCENT: 6.3 %
NEUTROPHILS ABSOLUTE COUNT: 5.8 10*9/L (ref 1.8–7.8)
NEUTROPHILS RELATIVE PERCENT: 77.9 %
NUCLEATED RED BLOOD CELLS: 0 /100{WBCs} (ref <=4)
PLATELET COUNT: 256 10*9/L (ref 150–450)
RED BLOOD CELL COUNT: 3.55 10*12/L — ABNORMAL LOW (ref 3.95–5.13)
RED CELL DISTRIBUTION WIDTH: 19.2 % — ABNORMAL HIGH (ref 12.2–15.2)
WBC ADJUSTED: 7.4 10*9/L (ref 3.6–11.2)

## 2024-12-19 LAB — CA 125: CA 125: 156 U/mL — ABNORMAL HIGH (ref 0–35)

## 2024-12-19 LAB — COMPREHENSIVE METABOLIC PANEL
ALBUMIN: 3.4 g/dL (ref 3.4–5.0)
ALKALINE PHOSPHATASE: 75 U/L (ref 46–116)
ALT (SGPT): <7 U/L — ABNORMAL LOW (ref 10–49)
ANION GAP: 12 mmol/L (ref 5–14)
AST (SGOT): 19 U/L (ref <=34)
BILIRUBIN TOTAL: 0.4 mg/dL (ref 0.3–1.2)
BLOOD UREA NITROGEN: 13 mg/dL (ref 9–23)
BUN / CREAT RATIO: 15
CALCIUM: 9.7 mg/dL (ref 8.7–10.4)
CHLORIDE: 106 mmol/L (ref 98–107)
CO2: 25.1 mmol/L (ref 20.0–31.0)
CREATININE: 0.85 mg/dL (ref 0.55–1.02)
EGFR CKD-EPI (2021) FEMALE: 73 mL/min/{1.73_m2} (ref >=60)
GLUCOSE RANDOM: 118 mg/dL (ref 70–179)
POTASSIUM: 3.9 mmol/L (ref 3.4–4.8)
PROTEIN TOTAL: 6.9 g/dL (ref 5.7–8.2)
SODIUM: 143 mmol/L (ref 135–145)

## 2024-12-19 MED ORDER — TAMOXIFEN 20 MG TABLET
ORAL_TABLET | Freq: Two times a day (BID) | ORAL | 2 refills | 30.00000 days | Status: CP
Start: 2024-12-19 — End: 2025-12-19

## 2024-12-19 MED ORDER — MEGESTROL 40 MG TABLET
ORAL_TABLET | Freq: Two times a day (BID) | ORAL | 2 refills | 30.00000 days | Status: CP
Start: 2024-12-19 — End: 2025-12-19

## 2024-12-19 MED ADMIN — heparin, porcine (PF) 100 unit/mL injection 500 Units: 500 [IU] | INTRAVENOUS | @ 19:00:00 | Stop: 2024-12-19

## 2024-12-19 NOTE — Telephone Encounter (Signed)
 Specialty Medication Follow-up    Robin Weber is a 74 y.o. female with recurrent endometrial cancer who I am seeing for follow up on oral chemotherapy.     Chemotherapy: Tamoxifen  20 mg pO BID for 3 weeks alternating with megestrol  80 mg PO BID for 3 weeks  Start date: 12/23/24 (tentative)    A/P:   1. Oral Chemotherapy: CBC w/diff and CMP reviewed. Grade 1 anemia which has worsened. Patient meets treatment parameters to start oral chemotherapy. Given that everolimus  has a high copay of $200 patient has elected to transition to alternating tamoxifen  and megestrol . Will reassess tolerability in 2 weeks.   Start tamoxifen  20 mg PO BID for 3 weeks THEN megestrol  80 mg PO BID for 3 weeks    I spent approximately 15 minutes in direct patient care.    Next follow up: In 2 weeks via telephone    Referring physician: Dr. Gretta Thersia Molt, PharmD, BCOP, CPP  Gynecologic Oncology Clinic Pharmacist  Pager: [phone]    S/O: Robin Weber was contacted via telephone regarding recent lab results and oral chemotherapy initiation. She has elected to move forward with alternating tamoxifen  and megestrol  given cost burden associated with everolimus .     Medications reviewed and updated in EPIC? no    Missed doses: N/A (not yet started)    Labs  Clinical Support on 12/19/2024   Component Date Value Ref Range Status    CA 125 12/19/2024 156 (H)  0 - 35 U/mL Final    Sodium 12/19/2024 143  135 - 145 mmol/L Final    Potassium 12/19/2024 3.9  3.4 - 4.8 mmol/L Final    Chloride 12/19/2024 106  98 - 107 mmol/L Final    CO2 12/19/2024 25.1  20.0 - 31.0 mmol/L Final    Anion Gap 12/19/2024 12  5 - 14 mmol/L Final    BUN 12/19/2024 13  9 - 23 mg/dL Final    Creatinine 98/71/7973 0.85  0.55 - 1.02 mg/dL Final    BUN/Creatinine Ratio 12/19/2024 15   Final    eGFR CKD-EPI (2021) Female 12/19/2024 73  >=60 mL/min/1.57m2 Final    eGFR calculated with CKD-EPI 2021 equation in accordance with [employer] and [employer] of Nephrology Task Force recommendations.    Glucose 12/19/2024 118  70 - 179 mg/dL Final    Calcium 98/71/7973 9.7  8.7 - 10.4 mg/dL Final    Albumin 98/71/7973 3.4  3.4 - 5.0 g/dL Final    Total Protein 12/19/2024 6.9  5.7 - 8.2 g/dL Final    Total Bilirubin 12/19/2024 0.4  0.3 - 1.2 mg/dL Final    AST 98/71/7973 19  <=34 U/L Final    ALT 12/19/2024 <7 (L)  10 - 49 U/L Final    Alkaline Phosphatase 12/19/2024 75  46 - 116 U/L Final    WBC 12/19/2024 7.4  3.6 - 11.2 10*9/L Final    RBC 12/19/2024 3.55 (L)  3.95 - 5.13 10*12/L Final    HGB 12/19/2024 10.4 (L)  11.3 - 14.9 g/dL Final    HCT 98/71/7973 31.7 (L)  34.0 - 44.0 % Final    MCV 12/19/2024 89.3  77.6 - 95.7 fL Final    MCH 12/19/2024 29.4  25.9 - 32.4 pg Final    MCHC 12/19/2024 32.9  32.0 - 36.0 g/dL Final    RDW 98/71/7973 19.2 (H)  12.2 - 15.2 % Final    MPV 12/19/2024 7.7  6.8 - 10.7  fL Final    Platelet 12/19/2024 256  150 - 450 10*9/L Final    nRBC 12/19/2024 0  <=4 /100 WBCs Final    Neutrophils % 12/19/2024 77.9  % Final    Lymphocytes % 12/19/2024 13.5  % Final    Monocytes % 12/19/2024 6.3  % Final    Eosinophils % 12/19/2024 1.6  % Final    Basophils % 12/19/2024 0.7  % Final    Absolute Neutrophils 12/19/2024 5.8  1.8 - 7.8 10*9/L Final    Absolute Lymphocytes 12/19/2024 1.0 (L)  1.1 - 3.6 10*9/L Final    Absolute Monocytes 12/19/2024 0.5  0.3 - 0.8 10*9/L Final    Absolute Eosinophils 12/19/2024 0.1  0.0 - 0.5 10*9/L Final    Absolute Basophils 12/19/2024 0.1  0.0 - 0.1 10*9/L Final    Anisocytosis 12/19/2024 Moderate (A)  Not Present Final

## 2024-12-20 DIAGNOSIS — C549 Malignant neoplasm of corpus uteri, unspecified: Secondary | ICD-10-CM

## 2024-12-20 DIAGNOSIS — C801 Malignant (primary) neoplasm, unspecified: Principal | ICD-10-CM

## 2024-12-20 NOTE — Progress Notes (Signed)
 Specialty Medication Follow-up    Robin Weber is a 74 y.o. female with recurrent endometrial cancer who I am seeing for follow up on their treatment with everolimus  and letrozole .     Chemotherapy: Everolimus  10 mg PO daily + Letrozole  2.5 mg PO daily  Start date: 09/11/24    A/P:   1. Oral Chemotherapy: No new labs to review. Patient has decided to restart everolimus  at reduced dose of 5 mg daily pending lab results. Will obtain labs prior to restarting.   HOLD everolimus  10 mg PO daily  Continue letrozole  2.5 mg PO daily  Obtain CBC w/diff and CMP in 1 week    2. MRONJ pain: Grade 1 which has continued to improve following oral surgery. Will continue to monitor.  Continue oxycodone  5-10 mg PO q6h PRN moderate pain  Continue acetaminophen 650 mg PO q6h PRN mild pain    I spent approximately 20 minutes in direct patient care.    Next follow up: Pending lab results    Referring physician: Dr. Gretta Thersia Molt, PharmD, BCOP, CPP  Gynecologic Oncology Clinic Pharmacist  Pager: [phone]    S/O: Robin Weber presents to clinic for follow up after a treatment holiday. She reports that she is starting to have a cough as she previously had but it is not as significant as it was before. She had oral surgery and her mouth pain is much improved.     Medications reviewed and updated in EPIC? no    Missed doses: N/A (on HOLD)    Labs (No new labs)
